# Patient Record
Sex: Female | Born: 1960 | Race: White | Hispanic: No | Marital: Single | Smoking: Never smoker
Health system: Southern US, Community
[De-identification: ages and names within clinical notes are randomized; demographics above are authoritative.]

## PROBLEM LIST (undated history)

## (undated) DIAGNOSIS — I10 Essential (primary) hypertension: Secondary | ICD-10-CM

## (undated) DIAGNOSIS — C140 Malignant neoplasm of pharynx, unspecified: Secondary | ICD-10-CM

## (undated) DIAGNOSIS — J45909 Unspecified asthma, uncomplicated: Secondary | ICD-10-CM

## (undated) DIAGNOSIS — M722 Plantar fascial fibromatosis: Secondary | ICD-10-CM

## (undated) DIAGNOSIS — E8881 Metabolic syndrome: Secondary | ICD-10-CM

## (undated) DIAGNOSIS — C569 Malignant neoplasm of unspecified ovary: Secondary | ICD-10-CM

## (undated) DIAGNOSIS — R32 Unspecified urinary incontinence: Secondary | ICD-10-CM

## (undated) DIAGNOSIS — K219 Gastro-esophageal reflux disease without esophagitis: Secondary | ICD-10-CM

## (undated) DIAGNOSIS — R110 Nausea: Secondary | ICD-10-CM

## (undated) DIAGNOSIS — E669 Obesity, unspecified: Secondary | ICD-10-CM

## (undated) DIAGNOSIS — F39 Unspecified mood [affective] disorder: Secondary | ICD-10-CM

## (undated) DIAGNOSIS — I5032 Chronic diastolic (congestive) heart failure: Secondary | ICD-10-CM

## (undated) DIAGNOSIS — C801 Malignant (primary) neoplasm, unspecified: Secondary | ICD-10-CM

## (undated) DIAGNOSIS — N3010 Interstitial cystitis (chronic) without hematuria: Secondary | ICD-10-CM

## (undated) DIAGNOSIS — H409 Unspecified glaucoma: Secondary | ICD-10-CM

## (undated) DIAGNOSIS — F5104 Psychophysiologic insomnia: Secondary | ICD-10-CM

## (undated) HISTORY — DX: Metabolic syndrome: E88.810

## (undated) HISTORY — DX: Metabolic syndrome: E88.81

## (undated) HISTORY — PX: KNEE SURGERY: SHX244

## (undated) HISTORY — DX: Unspecified glaucoma: H40.9

## (undated) HISTORY — DX: Unspecified mood (affective) disorder: F39

## (undated) HISTORY — DX: Unspecified urinary incontinence: R32

## (undated) HISTORY — DX: Obesity, unspecified: E66.9

## (undated) HISTORY — DX: Unspecified asthma, uncomplicated: J45.909

## (undated) HISTORY — DX: Nausea: R11.0

## (undated) HISTORY — PX: EYE SURGERY: SHX253

## (undated) HISTORY — DX: Chronic diastolic (congestive) heart failure: I50.32

## (undated) HISTORY — DX: Malignant (primary) neoplasm, unspecified: C80.1

## (undated) HISTORY — DX: Gastro-esophageal reflux disease without esophagitis: K21.9

## (undated) HISTORY — DX: Plantar fascial fibromatosis: M72.2

## (undated) HISTORY — DX: Psychophysiologic insomnia: F51.04

## (undated) HISTORY — DX: Essential (primary) hypertension: I10

## (undated) HISTORY — DX: Interstitial cystitis (chronic) without hematuria: N30.10

## (undated) HISTORY — DX: Malignant neoplasm of pharynx, unspecified: C14.0

## (undated) HISTORY — PX: BLADDER SURGERY: SHX569

## (undated) HISTORY — DX: Malignant neoplasm of unspecified ovary: C56.9

---

## 1987-03-17 HISTORY — PX: TUBAL LIGATION: SHX77

## 1990-03-16 DIAGNOSIS — C801 Malignant (primary) neoplasm, unspecified: Secondary | ICD-10-CM

## 1990-03-16 HISTORY — PX: ABDOMINAL HYSTERECTOMY: SHX81

## 1990-03-16 HISTORY — DX: Malignant (primary) neoplasm, unspecified: C80.1

## 1990-11-07 DIAGNOSIS — Z90710 Acquired absence of both cervix and uterus: Secondary | ICD-10-CM | POA: Insufficient documentation

## 1992-03-16 DIAGNOSIS — C569 Malignant neoplasm of unspecified ovary: Secondary | ICD-10-CM

## 1992-03-16 HISTORY — PX: TONSILLECTOMY AND ADENOIDECTOMY: SUR1326

## 1992-03-16 HISTORY — DX: Malignant neoplasm of unspecified ovary: C56.9

## 1994-03-16 HISTORY — PX: FOOT SURGERY: SHX648

## 1996-03-16 DIAGNOSIS — C140 Malignant neoplasm of pharynx, unspecified: Secondary | ICD-10-CM

## 1996-03-16 HISTORY — DX: Malignant neoplasm of pharynx, unspecified: C14.0

## 2003-05-15 LAB — HM COLONOSCOPY: HM Colonoscopy: NORMAL

## 2004-01-09 ENCOUNTER — Ambulatory Visit: Payer: Self-pay | Admitting: Pain Medicine

## 2004-01-28 ENCOUNTER — Ambulatory Visit: Payer: Self-pay | Admitting: Pain Medicine

## 2004-02-08 ENCOUNTER — Ambulatory Visit: Payer: Self-pay

## 2004-02-27 ENCOUNTER — Emergency Department: Payer: Self-pay | Admitting: General Practice

## 2004-02-28 ENCOUNTER — Ambulatory Visit: Payer: Self-pay | Admitting: Pain Medicine

## 2004-03-05 ENCOUNTER — Ambulatory Visit: Payer: Self-pay | Admitting: Pain Medicine

## 2004-04-10 ENCOUNTER — Ambulatory Visit: Payer: Self-pay | Admitting: Pain Medicine

## 2004-04-21 ENCOUNTER — Ambulatory Visit: Payer: Self-pay | Admitting: Pain Medicine

## 2004-05-27 ENCOUNTER — Ambulatory Visit: Payer: Self-pay | Admitting: Pain Medicine

## 2004-06-04 ENCOUNTER — Ambulatory Visit: Payer: Self-pay | Admitting: Pain Medicine

## 2004-07-01 ENCOUNTER — Ambulatory Visit: Payer: Self-pay | Admitting: Pain Medicine

## 2004-07-07 ENCOUNTER — Ambulatory Visit: Payer: Self-pay | Admitting: Pain Medicine

## 2004-07-21 ENCOUNTER — Emergency Department: Payer: Self-pay | Admitting: Emergency Medicine

## 2004-08-19 ENCOUNTER — Ambulatory Visit: Payer: Self-pay | Admitting: Pain Medicine

## 2004-08-25 ENCOUNTER — Ambulatory Visit: Payer: Self-pay | Admitting: Pain Medicine

## 2004-09-29 ENCOUNTER — Ambulatory Visit: Payer: Self-pay | Admitting: Pain Medicine

## 2004-10-30 ENCOUNTER — Ambulatory Visit: Payer: Self-pay | Admitting: Pain Medicine

## 2004-10-31 ENCOUNTER — Ambulatory Visit: Payer: Self-pay | Admitting: Pain Medicine

## 2004-11-03 ENCOUNTER — Ambulatory Visit: Payer: Self-pay | Admitting: Pain Medicine

## 2005-01-08 ENCOUNTER — Ambulatory Visit: Payer: Self-pay | Admitting: Pain Medicine

## 2005-01-14 ENCOUNTER — Ambulatory Visit: Payer: Self-pay | Admitting: Pain Medicine

## 2005-02-03 ENCOUNTER — Ambulatory Visit: Payer: Self-pay | Admitting: Pain Medicine

## 2005-02-09 ENCOUNTER — Ambulatory Visit: Payer: Self-pay | Admitting: Pain Medicine

## 2005-03-03 ENCOUNTER — Ambulatory Visit: Payer: Self-pay | Admitting: Pain Medicine

## 2005-03-11 ENCOUNTER — Ambulatory Visit: Payer: Self-pay | Admitting: Pain Medicine

## 2005-03-31 ENCOUNTER — Ambulatory Visit: Payer: Self-pay | Admitting: Pain Medicine

## 2005-04-06 ENCOUNTER — Ambulatory Visit: Payer: Self-pay | Admitting: Pain Medicine

## 2005-04-28 ENCOUNTER — Ambulatory Visit: Payer: Self-pay | Admitting: Pain Medicine

## 2005-05-01 ENCOUNTER — Encounter: Admission: RE | Admit: 2005-05-01 | Discharge: 2005-05-01 | Payer: Self-pay | Admitting: Neurosurgery

## 2005-05-04 ENCOUNTER — Ambulatory Visit: Payer: Self-pay | Admitting: Pain Medicine

## 2005-05-08 ENCOUNTER — Inpatient Hospital Stay: Payer: Self-pay | Admitting: Internal Medicine

## 2005-05-21 ENCOUNTER — Ambulatory Visit: Payer: Self-pay | Admitting: Pain Medicine

## 2005-05-27 ENCOUNTER — Ambulatory Visit: Payer: Self-pay | Admitting: Pain Medicine

## 2005-06-17 ENCOUNTER — Ambulatory Visit: Payer: Self-pay

## 2005-06-18 ENCOUNTER — Ambulatory Visit: Payer: Self-pay | Admitting: Pain Medicine

## 2005-06-29 ENCOUNTER — Ambulatory Visit: Payer: Self-pay | Admitting: Unknown Physician Specialty

## 2005-07-08 ENCOUNTER — Ambulatory Visit: Payer: Self-pay | Admitting: Pain Medicine

## 2005-07-14 ENCOUNTER — Ambulatory Visit: Payer: Self-pay | Admitting: Pain Medicine

## 2005-07-20 ENCOUNTER — Ambulatory Visit: Payer: Self-pay | Admitting: Pain Medicine

## 2005-08-11 ENCOUNTER — Ambulatory Visit: Payer: Self-pay | Admitting: Pain Medicine

## 2005-08-17 ENCOUNTER — Ambulatory Visit: Payer: Self-pay | Admitting: Pain Medicine

## 2005-09-08 ENCOUNTER — Ambulatory Visit: Payer: Self-pay | Admitting: Pain Medicine

## 2005-09-29 ENCOUNTER — Other Ambulatory Visit: Payer: Self-pay

## 2005-09-29 ENCOUNTER — Ambulatory Visit: Payer: Self-pay | Admitting: Urology

## 2005-09-29 ENCOUNTER — Ambulatory Visit: Payer: Self-pay | Admitting: Pain Medicine

## 2005-10-06 ENCOUNTER — Ambulatory Visit: Payer: Self-pay | Admitting: Urology

## 2005-10-29 ENCOUNTER — Ambulatory Visit: Payer: Self-pay | Admitting: Pain Medicine

## 2005-11-02 ENCOUNTER — Ambulatory Visit: Payer: Self-pay | Admitting: Pain Medicine

## 2005-11-21 ENCOUNTER — Emergency Department: Payer: Self-pay | Admitting: Emergency Medicine

## 2005-11-26 ENCOUNTER — Ambulatory Visit: Payer: Self-pay | Admitting: Pain Medicine

## 2005-12-02 ENCOUNTER — Ambulatory Visit: Payer: Self-pay | Admitting: Pain Medicine

## 2005-12-29 ENCOUNTER — Ambulatory Visit: Payer: Self-pay | Admitting: Pain Medicine

## 2006-01-18 ENCOUNTER — Ambulatory Visit: Payer: Self-pay | Admitting: Pain Medicine

## 2006-01-27 ENCOUNTER — Ambulatory Visit: Payer: Self-pay | Admitting: Gastroenterology

## 2006-02-23 ENCOUNTER — Ambulatory Visit: Payer: Self-pay | Admitting: Pain Medicine

## 2006-03-03 ENCOUNTER — Ambulatory Visit: Payer: Self-pay | Admitting: Pain Medicine

## 2006-03-25 ENCOUNTER — Ambulatory Visit: Payer: Self-pay | Admitting: Pain Medicine

## 2006-03-29 ENCOUNTER — Ambulatory Visit: Payer: Self-pay | Admitting: Pain Medicine

## 2006-04-24 ENCOUNTER — Ambulatory Visit: Payer: Self-pay | Admitting: Specialist

## 2006-04-27 ENCOUNTER — Ambulatory Visit: Payer: Self-pay | Admitting: Pain Medicine

## 2006-05-03 ENCOUNTER — Ambulatory Visit: Payer: Self-pay | Admitting: Pain Medicine

## 2006-05-25 ENCOUNTER — Ambulatory Visit: Payer: Self-pay | Admitting: Pain Medicine

## 2006-06-02 ENCOUNTER — Ambulatory Visit: Payer: Self-pay | Admitting: Pain Medicine

## 2006-06-24 ENCOUNTER — Ambulatory Visit: Payer: Self-pay | Admitting: Pain Medicine

## 2006-06-28 ENCOUNTER — Ambulatory Visit: Payer: Self-pay | Admitting: Unknown Physician Specialty

## 2006-06-30 ENCOUNTER — Ambulatory Visit: Payer: Self-pay | Admitting: Pain Medicine

## 2006-07-22 ENCOUNTER — Ambulatory Visit: Payer: Self-pay | Admitting: Pain Medicine

## 2006-07-28 ENCOUNTER — Ambulatory Visit: Payer: Self-pay | Admitting: Psychiatry

## 2006-08-19 ENCOUNTER — Ambulatory Visit: Payer: Self-pay | Admitting: Pain Medicine

## 2006-09-06 ENCOUNTER — Ambulatory Visit: Payer: Self-pay | Admitting: Pain Medicine

## 2006-09-23 ENCOUNTER — Ambulatory Visit: Payer: Self-pay | Admitting: Family Medicine

## 2006-10-14 ENCOUNTER — Ambulatory Visit: Payer: Self-pay | Admitting: Pain Medicine

## 2006-10-20 ENCOUNTER — Ambulatory Visit: Payer: Self-pay | Admitting: Pain Medicine

## 2006-11-18 ENCOUNTER — Ambulatory Visit: Payer: Self-pay | Admitting: Pain Medicine

## 2006-12-13 ENCOUNTER — Ambulatory Visit: Payer: Self-pay | Admitting: Pain Medicine

## 2007-01-18 ENCOUNTER — Ambulatory Visit: Payer: Self-pay | Admitting: Pain Medicine

## 2007-01-26 ENCOUNTER — Ambulatory Visit: Payer: Self-pay | Admitting: Pain Medicine

## 2007-02-17 ENCOUNTER — Ambulatory Visit: Payer: Self-pay | Admitting: Pain Medicine

## 2007-02-28 ENCOUNTER — Ambulatory Visit: Payer: Self-pay | Admitting: Pain Medicine

## 2007-04-19 ENCOUNTER — Ambulatory Visit: Payer: Self-pay | Admitting: Pain Medicine

## 2007-05-02 ENCOUNTER — Ambulatory Visit: Payer: Self-pay | Admitting: Pain Medicine

## 2007-05-19 ENCOUNTER — Ambulatory Visit: Payer: Self-pay | Admitting: Pain Medicine

## 2007-05-23 ENCOUNTER — Ambulatory Visit: Payer: Self-pay | Admitting: Pain Medicine

## 2007-05-31 ENCOUNTER — Ambulatory Visit: Payer: Self-pay | Admitting: Pain Medicine

## 2007-06-21 ENCOUNTER — Ambulatory Visit: Payer: Self-pay | Admitting: Pain Medicine

## 2007-06-27 ENCOUNTER — Ambulatory Visit: Payer: Self-pay | Admitting: Pain Medicine

## 2007-08-02 IMAGING — MG UNKNOWN MG STUDY
1 series · 6 of 6 positions shown · non-contrast
Comparison: none

REASON FOR EXAM: Fibrocystic changes
COMMENTS:

PROCEDURE:     MAM - MAM DGTL DIAGNOSTIC MAMMO W/CAD  - June 17, 2005  [DATE]
RESULT:     No dominant masses or pathologic clustered calcifications are
demonstrated. The exam is stable from prior exams. CAD evaluation is
non-focal. Scarring is noted over the RIGHT breast.

[Series 1921: R CC · right · 6 of 6 slices shown]
[im 1/6]
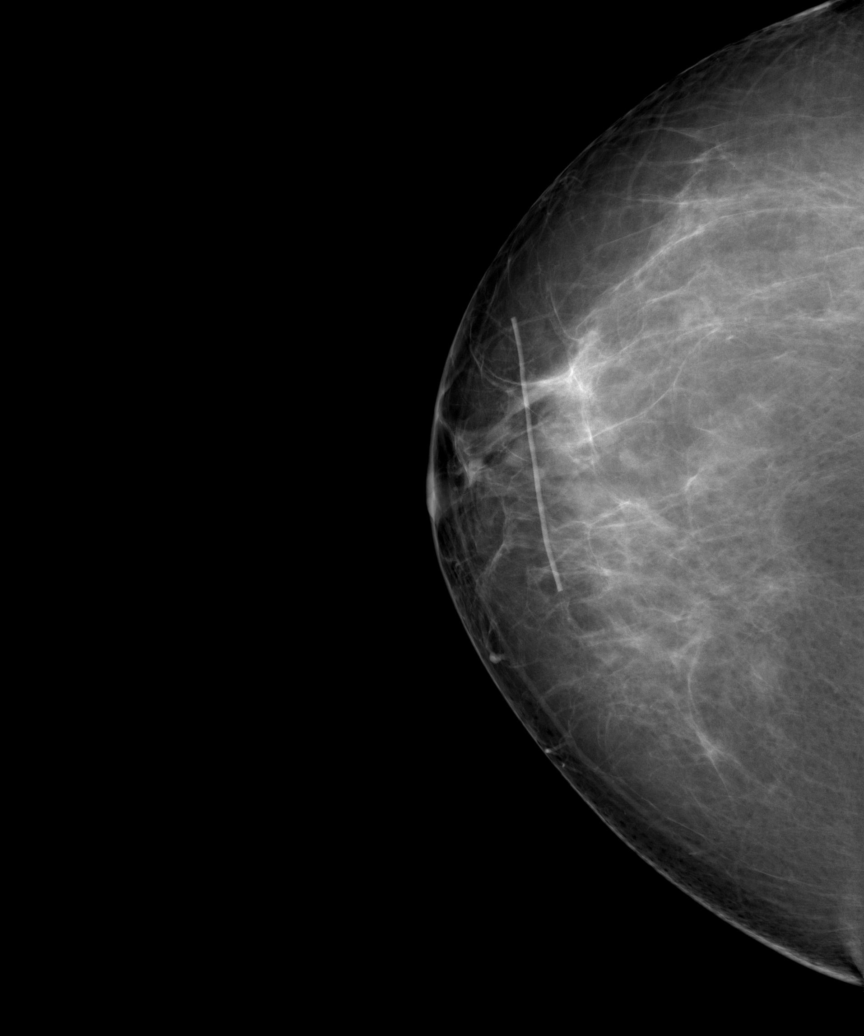
[im 2/6]
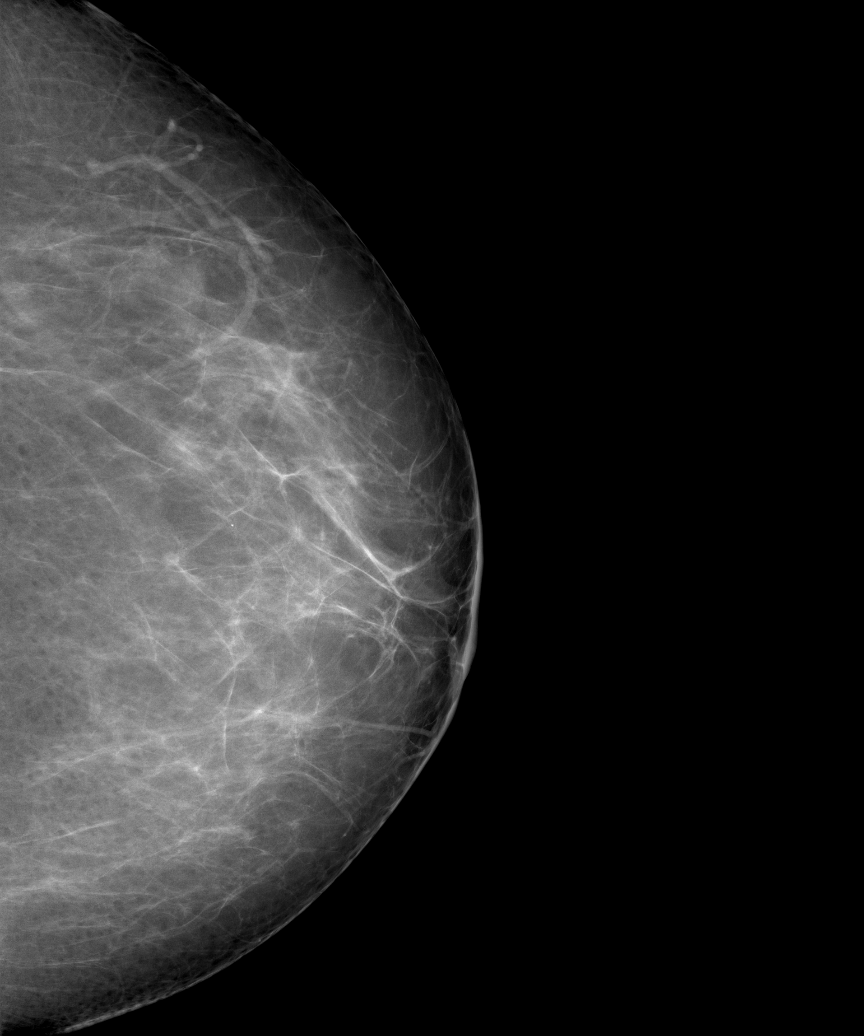
[im 3/6]
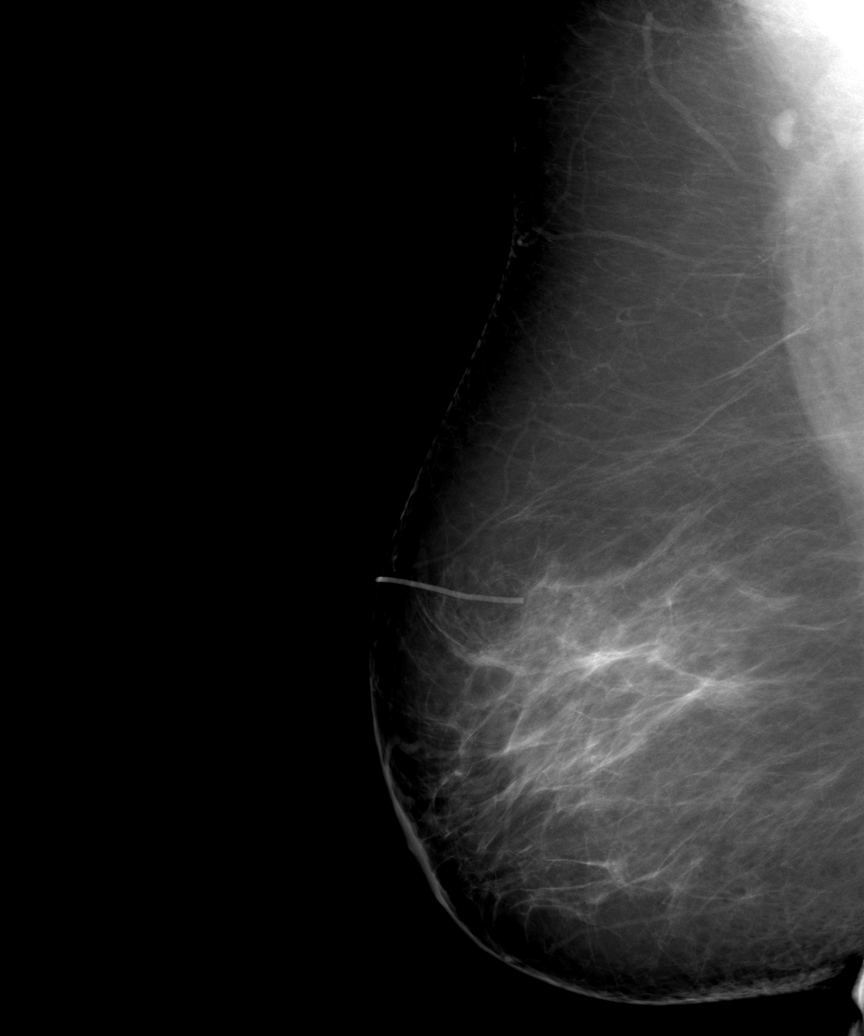
[im 4/6]
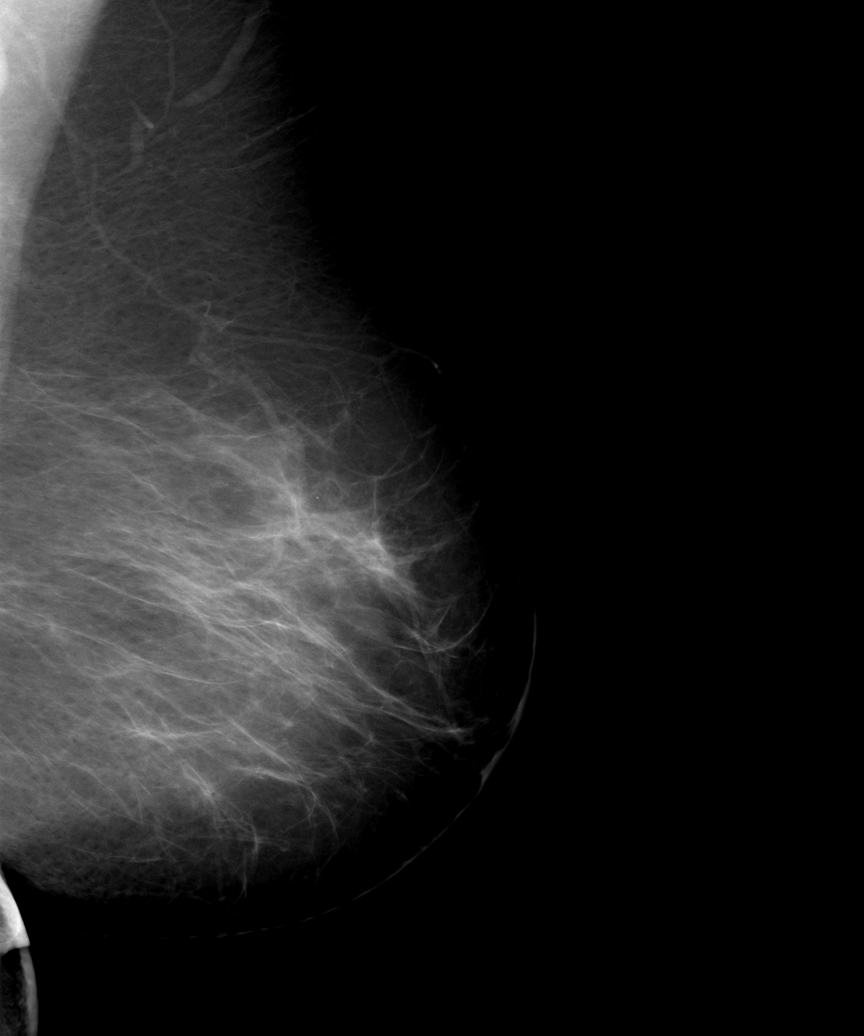
[im 5/6]
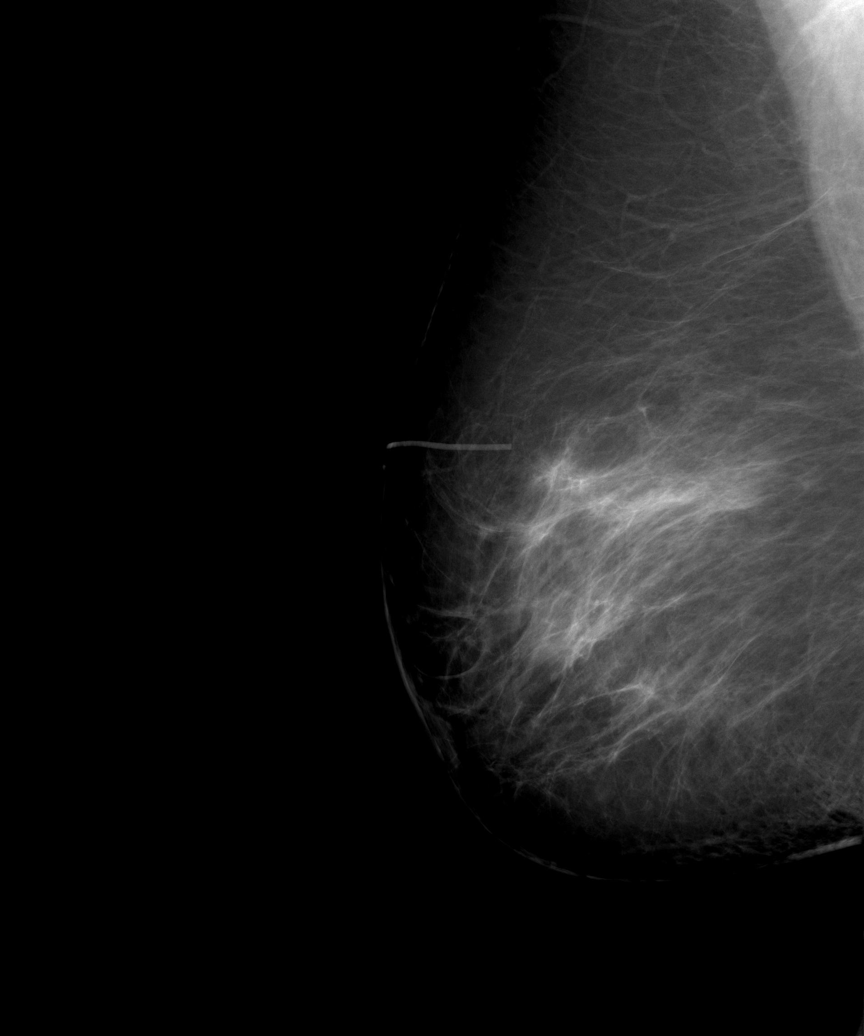
[im 6/6]
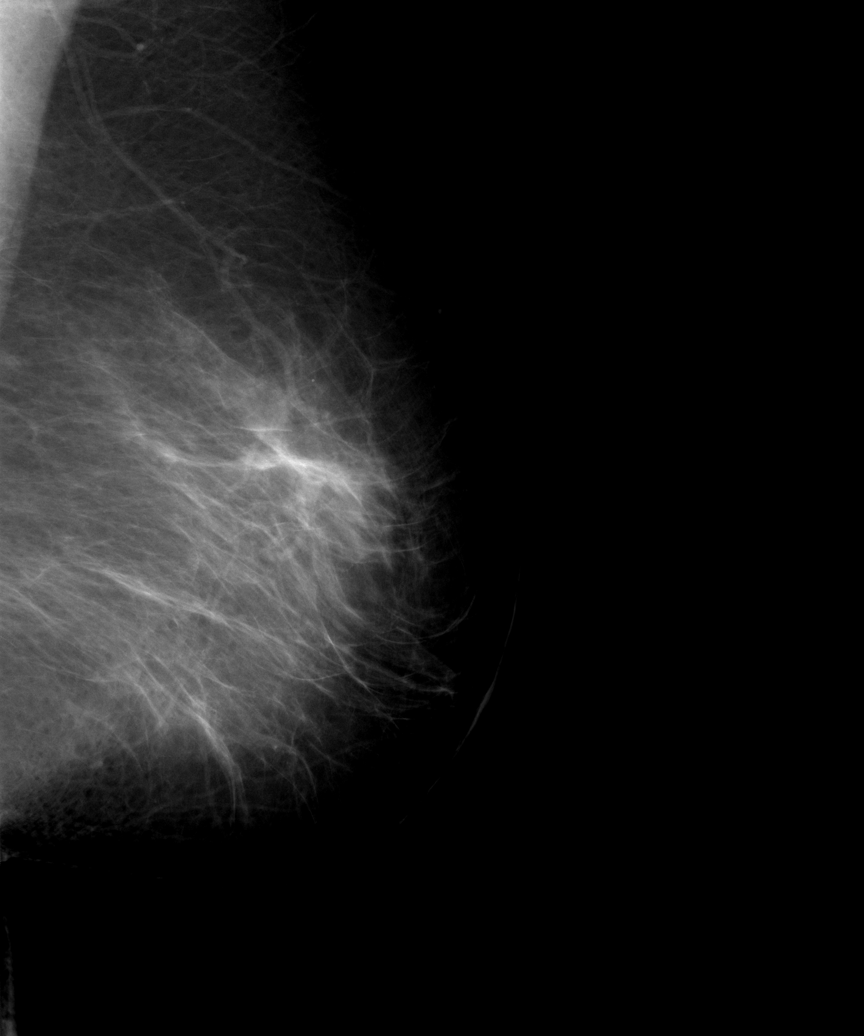

[6 of 6 positions shown; findings below may reference images not displayed]

IMPRESSION: Stable, benign exam. Yearly follow-up mammograms are
suggested.

BI-RADS:  Category 2 - Benign Imaging Findings

Thank you for this opportunity to contribute to the care of your patient.

A NEGATIVE MAMMOGRAM REPORT DOES NOT PRECLUDE BIOPSY OR OTHER EVALUATION OF
CLINICALLY PALPABLE OR OTHERWISE SUSPICIOUS MASS OR LESION.  BREAST CANCER
MAY NOT BE DETECTED BY MAMMOGRAPHY IN UP TO 10% OF CASES.

## 2007-08-11 ENCOUNTER — Ambulatory Visit: Payer: Self-pay | Admitting: Pain Medicine

## 2007-08-16 ENCOUNTER — Ambulatory Visit: Payer: Self-pay | Admitting: Gastroenterology

## 2007-08-29 ENCOUNTER — Ambulatory Visit: Payer: Self-pay | Admitting: Pain Medicine

## 2007-09-13 ENCOUNTER — Ambulatory Visit: Payer: Self-pay | Admitting: Pain Medicine

## 2007-09-21 ENCOUNTER — Ambulatory Visit: Payer: Self-pay | Admitting: Pain Medicine

## 2007-10-18 ENCOUNTER — Ambulatory Visit: Payer: Self-pay | Admitting: Pain Medicine

## 2007-10-26 ENCOUNTER — Ambulatory Visit: Payer: Self-pay | Admitting: Pain Medicine

## 2007-11-17 ENCOUNTER — Ambulatory Visit: Payer: Self-pay | Admitting: Pain Medicine

## 2007-12-12 ENCOUNTER — Ambulatory Visit: Payer: Self-pay | Admitting: Pain Medicine

## 2008-01-17 ENCOUNTER — Ambulatory Visit: Payer: Self-pay | Admitting: Pain Medicine

## 2008-01-30 ENCOUNTER — Ambulatory Visit: Payer: Self-pay | Admitting: Pain Medicine

## 2008-02-16 ENCOUNTER — Ambulatory Visit: Payer: Self-pay | Admitting: Pain Medicine

## 2008-03-05 ENCOUNTER — Ambulatory Visit: Payer: Self-pay | Admitting: Pain Medicine

## 2008-03-16 LAB — HM DEXA SCAN

## 2008-03-19 ENCOUNTER — Ambulatory Visit: Payer: Self-pay | Admitting: Pain Medicine

## 2008-04-10 ENCOUNTER — Ambulatory Visit: Payer: Self-pay | Admitting: Pain Medicine

## 2008-05-09 ENCOUNTER — Ambulatory Visit: Payer: Self-pay | Admitting: Pain Medicine

## 2008-06-12 ENCOUNTER — Ambulatory Visit: Payer: Self-pay | Admitting: Pain Medicine

## 2008-07-04 ENCOUNTER — Ambulatory Visit: Payer: Self-pay | Admitting: Pain Medicine

## 2008-07-24 ENCOUNTER — Ambulatory Visit: Payer: Self-pay | Admitting: Pain Medicine

## 2008-07-30 ENCOUNTER — Ambulatory Visit: Payer: Self-pay | Admitting: Pain Medicine

## 2008-09-11 ENCOUNTER — Ambulatory Visit: Payer: Self-pay | Admitting: Pain Medicine

## 2008-10-16 ENCOUNTER — Ambulatory Visit: Payer: Self-pay | Admitting: Pain Medicine

## 2008-10-31 ENCOUNTER — Ambulatory Visit: Payer: Self-pay | Admitting: Pain Medicine

## 2008-11-13 ENCOUNTER — Ambulatory Visit: Payer: Self-pay | Admitting: Pain Medicine

## 2008-12-13 ENCOUNTER — Ambulatory Visit: Payer: Self-pay | Admitting: Pain Medicine

## 2009-01-07 ENCOUNTER — Ambulatory Visit: Payer: Self-pay | Admitting: Pain Medicine

## 2009-01-30 ENCOUNTER — Emergency Department: Payer: Self-pay | Admitting: Emergency Medicine

## 2009-02-12 ENCOUNTER — Ambulatory Visit: Payer: Self-pay | Admitting: Pain Medicine

## 2009-02-20 ENCOUNTER — Ambulatory Visit: Payer: Self-pay | Admitting: Pain Medicine

## 2009-03-14 ENCOUNTER — Ambulatory Visit: Payer: Self-pay | Admitting: Pain Medicine

## 2009-04-11 ENCOUNTER — Ambulatory Visit: Payer: Self-pay | Admitting: Pain Medicine

## 2009-04-15 ENCOUNTER — Ambulatory Visit: Payer: Self-pay | Admitting: Pain Medicine

## 2009-05-07 ENCOUNTER — Ambulatory Visit: Payer: Self-pay | Admitting: Pain Medicine

## 2009-06-06 ENCOUNTER — Ambulatory Visit: Payer: Self-pay | Admitting: Pain Medicine

## 2009-06-17 ENCOUNTER — Ambulatory Visit: Payer: Self-pay | Admitting: Pain Medicine

## 2009-07-04 ENCOUNTER — Ambulatory Visit: Payer: Self-pay | Admitting: Pain Medicine

## 2009-08-01 ENCOUNTER — Ambulatory Visit: Payer: Self-pay | Admitting: Pain Medicine

## 2009-08-14 ENCOUNTER — Ambulatory Visit: Payer: Self-pay | Admitting: Pain Medicine

## 2009-08-29 ENCOUNTER — Ambulatory Visit: Payer: Self-pay | Admitting: Pain Medicine

## 2009-09-24 ENCOUNTER — Ambulatory Visit: Payer: Self-pay | Admitting: Pain Medicine

## 2009-10-11 ENCOUNTER — Emergency Department: Payer: Self-pay | Admitting: Unknown Physician Specialty

## 2009-10-24 ENCOUNTER — Ambulatory Visit: Payer: Self-pay | Admitting: Pain Medicine

## 2009-11-21 ENCOUNTER — Ambulatory Visit: Payer: Self-pay | Admitting: Pain Medicine

## 2009-12-17 ENCOUNTER — Ambulatory Visit: Payer: Self-pay | Admitting: Pain Medicine

## 2010-01-01 ENCOUNTER — Encounter: Payer: Self-pay | Admitting: Family Medicine

## 2010-01-14 ENCOUNTER — Ambulatory Visit: Payer: Self-pay | Admitting: Pain Medicine

## 2010-01-22 ENCOUNTER — Ambulatory Visit: Payer: Self-pay | Admitting: Pain Medicine

## 2010-02-04 ENCOUNTER — Encounter: Payer: Self-pay | Admitting: Family Medicine

## 2010-02-11 ENCOUNTER — Ambulatory Visit: Payer: Self-pay | Admitting: Pain Medicine

## 2010-02-13 ENCOUNTER — Encounter: Payer: Self-pay | Admitting: Family Medicine

## 2010-03-18 ENCOUNTER — Ambulatory Visit: Payer: Self-pay | Admitting: Pain Medicine

## 2010-03-19 ENCOUNTER — Encounter: Payer: Self-pay | Admitting: Family Medicine

## 2010-03-25 ENCOUNTER — Ambulatory Visit: Payer: Self-pay | Admitting: Family Medicine

## 2010-04-17 ENCOUNTER — Ambulatory Visit: Payer: Self-pay | Admitting: Pain Medicine

## 2010-04-23 ENCOUNTER — Ambulatory Visit: Payer: Self-pay | Admitting: Pain Medicine

## 2010-05-15 ENCOUNTER — Ambulatory Visit: Payer: Self-pay | Admitting: Pain Medicine

## 2010-05-26 ENCOUNTER — Ambulatory Visit: Payer: Self-pay | Admitting: Pain Medicine

## 2010-06-10 ENCOUNTER — Ambulatory Visit: Payer: Self-pay | Admitting: Pain Medicine

## 2010-07-15 ENCOUNTER — Ambulatory Visit: Payer: Self-pay | Admitting: Pain Medicine

## 2010-07-30 ENCOUNTER — Ambulatory Visit: Payer: Self-pay | Admitting: Pain Medicine

## 2010-08-14 ENCOUNTER — Ambulatory Visit: Payer: Self-pay | Admitting: Pain Medicine

## 2010-09-11 ENCOUNTER — Ambulatory Visit: Payer: Self-pay | Admitting: Pain Medicine

## 2010-09-24 ENCOUNTER — Ambulatory Visit: Payer: Self-pay | Admitting: Bariatrics

## 2010-09-25 ENCOUNTER — Ambulatory Visit: Payer: Self-pay | Admitting: Specialist

## 2010-10-09 ENCOUNTER — Ambulatory Visit: Payer: Self-pay | Admitting: Pain Medicine

## 2010-10-17 ENCOUNTER — Ambulatory Visit: Payer: Self-pay | Admitting: Specialist

## 2010-10-30 ENCOUNTER — Ambulatory Visit: Payer: Self-pay | Admitting: Bariatrics

## 2010-11-06 ENCOUNTER — Ambulatory Visit: Payer: Self-pay | Admitting: Pain Medicine

## 2010-11-15 ENCOUNTER — Ambulatory Visit: Payer: Self-pay | Admitting: Bariatrics

## 2010-12-09 ENCOUNTER — Ambulatory Visit: Payer: Self-pay | Admitting: Pain Medicine

## 2010-12-09 DIAGNOSIS — N3941 Urge incontinence: Secondary | ICD-10-CM | POA: Insufficient documentation

## 2010-12-09 DIAGNOSIS — R351 Nocturia: Secondary | ICD-10-CM | POA: Insufficient documentation

## 2010-12-09 DIAGNOSIS — R350 Frequency of micturition: Secondary | ICD-10-CM | POA: Insufficient documentation

## 2010-12-11 ENCOUNTER — Ambulatory Visit: Payer: Self-pay | Admitting: Pain Medicine

## 2010-12-15 ENCOUNTER — Ambulatory Visit: Payer: Self-pay | Admitting: Pain Medicine

## 2010-12-16 ENCOUNTER — Ambulatory Visit: Payer: Self-pay | Admitting: Pain Medicine

## 2011-01-06 ENCOUNTER — Ambulatory Visit: Payer: Self-pay | Admitting: Pain Medicine

## 2011-01-12 ENCOUNTER — Ambulatory Visit: Payer: Self-pay | Admitting: Pain Medicine

## 2011-01-29 ENCOUNTER — Ambulatory Visit: Payer: Self-pay | Admitting: Pain Medicine

## 2011-02-09 ENCOUNTER — Ambulatory Visit: Payer: Self-pay | Admitting: Pain Medicine

## 2011-02-26 ENCOUNTER — Ambulatory Visit: Payer: Self-pay | Admitting: Pain Medicine

## 2011-03-31 ENCOUNTER — Ambulatory Visit: Payer: Self-pay | Admitting: Pain Medicine

## 2011-04-30 ENCOUNTER — Ambulatory Visit: Payer: Self-pay | Admitting: Pain Medicine

## 2011-05-28 ENCOUNTER — Ambulatory Visit: Payer: Self-pay | Admitting: Pain Medicine

## 2011-06-01 ENCOUNTER — Ambulatory Visit: Payer: Self-pay | Admitting: Pain Medicine

## 2011-06-23 ENCOUNTER — Ambulatory Visit: Payer: Self-pay | Admitting: Pain Medicine

## 2011-07-28 ENCOUNTER — Ambulatory Visit: Payer: Self-pay | Admitting: Pain Medicine

## 2011-08-03 ENCOUNTER — Ambulatory Visit: Payer: Self-pay | Admitting: Pain Medicine

## 2011-08-27 ENCOUNTER — Ambulatory Visit: Payer: Self-pay | Admitting: Pain Medicine

## 2011-09-29 ENCOUNTER — Ambulatory Visit: Payer: Self-pay | Admitting: Pain Medicine

## 2011-10-12 ENCOUNTER — Ambulatory Visit: Payer: Self-pay | Admitting: Pain Medicine

## 2011-10-29 ENCOUNTER — Ambulatory Visit: Payer: Self-pay | Admitting: Pain Medicine

## 2011-11-26 ENCOUNTER — Ambulatory Visit: Payer: Self-pay | Admitting: Pain Medicine

## 2011-12-24 ENCOUNTER — Ambulatory Visit: Payer: Self-pay | Admitting: Pain Medicine

## 2011-12-24 ENCOUNTER — Encounter: Payer: Self-pay | Admitting: Nurse Practitioner

## 2011-12-25 ENCOUNTER — Encounter: Payer: Self-pay | Admitting: Cardiothoracic Surgery

## 2012-01-15 ENCOUNTER — Encounter: Payer: Self-pay | Admitting: Cardiothoracic Surgery

## 2012-01-15 ENCOUNTER — Encounter: Payer: Self-pay | Admitting: Nurse Practitioner

## 2012-01-19 ENCOUNTER — Ambulatory Visit: Payer: Self-pay | Admitting: Pain Medicine

## 2012-01-25 ENCOUNTER — Ambulatory Visit: Payer: Self-pay | Admitting: Pain Medicine

## 2012-02-08 ENCOUNTER — Ambulatory Visit: Payer: Self-pay | Admitting: Pain Medicine

## 2012-02-14 ENCOUNTER — Encounter: Payer: Self-pay | Admitting: Nurse Practitioner

## 2012-02-17 ENCOUNTER — Ambulatory Visit: Payer: Self-pay | Admitting: Pain Medicine

## 2012-02-18 ENCOUNTER — Encounter: Payer: Self-pay | Admitting: Cardiothoracic Surgery

## 2012-03-24 ENCOUNTER — Ambulatory Visit: Payer: Self-pay | Admitting: Pain Medicine

## 2012-03-30 ENCOUNTER — Ambulatory Visit: Payer: Self-pay | Admitting: Pain Medicine

## 2012-04-05 ENCOUNTER — Emergency Department: Payer: Self-pay | Admitting: Emergency Medicine

## 2012-04-21 ENCOUNTER — Ambulatory Visit: Payer: Self-pay | Admitting: Pain Medicine

## 2012-05-06 ENCOUNTER — Ambulatory Visit: Payer: Self-pay | Admitting: Pain Medicine

## 2012-05-17 ENCOUNTER — Ambulatory Visit: Payer: Self-pay | Admitting: Pain Medicine

## 2012-06-15 ENCOUNTER — Ambulatory Visit: Payer: Self-pay | Admitting: Pain Medicine

## 2012-07-06 ENCOUNTER — Ambulatory Visit: Payer: Self-pay | Admitting: Pain Medicine

## 2012-07-13 ENCOUNTER — Encounter: Payer: Self-pay | Admitting: Cardiothoracic Surgery

## 2012-07-13 ENCOUNTER — Encounter: Payer: Self-pay | Admitting: Nurse Practitioner

## 2012-07-14 ENCOUNTER — Encounter: Payer: Self-pay | Admitting: Nurse Practitioner

## 2012-07-19 ENCOUNTER — Ambulatory Visit: Payer: Self-pay | Admitting: Pain Medicine

## 2012-08-01 ENCOUNTER — Encounter: Payer: Self-pay | Admitting: Cardiothoracic Surgery

## 2012-08-14 ENCOUNTER — Encounter: Payer: Self-pay | Admitting: Cardiothoracic Surgery

## 2012-08-14 ENCOUNTER — Encounter: Payer: Self-pay | Admitting: Nurse Practitioner

## 2012-08-17 ENCOUNTER — Ambulatory Visit: Payer: Self-pay | Admitting: Pain Medicine

## 2012-09-15 ENCOUNTER — Ambulatory Visit: Payer: Self-pay | Admitting: Pain Medicine

## 2012-09-29 ENCOUNTER — Emergency Department: Payer: Self-pay | Admitting: Emergency Medicine

## 2012-10-04 ENCOUNTER — Encounter: Payer: Self-pay | Admitting: *Deleted

## 2012-10-12 ENCOUNTER — Ambulatory Visit: Payer: Self-pay | Admitting: Pain Medicine

## 2012-10-20 ENCOUNTER — Other Ambulatory Visit: Payer: Self-pay

## 2012-10-20 ENCOUNTER — Encounter: Payer: Self-pay | Admitting: General Surgery

## 2012-10-20 ENCOUNTER — Ambulatory Visit (INDEPENDENT_AMBULATORY_CARE_PROVIDER_SITE_OTHER): Payer: Medicare Other | Admitting: General Surgery

## 2012-10-20 ENCOUNTER — Other Ambulatory Visit: Payer: Self-pay | Admitting: *Deleted

## 2012-10-20 ENCOUNTER — Ambulatory Visit: Payer: Self-pay | Admitting: General Surgery

## 2012-10-20 ENCOUNTER — Inpatient Hospital Stay
Admission: RE | Admit: 2012-10-20 | Discharge: 2012-10-20 | Disposition: A | Discharge status: Home / Self Care | Payer: Self-pay | Source: Ambulatory Visit | Attending: General Surgery | Admitting: General Surgery

## 2012-10-20 VITALS — BP 138/98 | HR 76 | Resp 16 | Ht 67.0 in | Wt 254.0 lb

## 2012-10-20 DIAGNOSIS — R109 Unspecified abdominal pain: Secondary | ICD-10-CM | POA: Insufficient documentation

## 2012-10-20 DIAGNOSIS — N63 Unspecified lump in unspecified breast: Secondary | ICD-10-CM

## 2012-10-20 DIAGNOSIS — Z1231 Encounter for screening mammogram for malignant neoplasm of breast: Secondary | ICD-10-CM

## 2012-10-20 NOTE — Progress Notes (Signed)
The patient will be asked to have a bilateral screening mammogram at [facility]. This has been arranged for 11-04-12 at 11 am.

## 2012-10-20 NOTE — Patient Instructions (Addendum)
Continue self breast exams. Call office for any new breast issues or concerns.  The patient is aware to call back for any questions or concerns.  The patient will be asked to have a bilateral screening mammogram at [facility]. This has been arranged for 11-04-12 at 11 am.

## 2012-10-20 NOTE — Progress Notes (Signed)
Patient ID: Jacqueline Campbell, female   DOB: 1960-04-01, 52 y.o.   MRN: [id number]  No chief complaint on file.   HPI Jacqueline Campbell is a 52 y.o. female. Patient here today for evaluation of a left lower abdomen knot that haas been there for about 6 months.  States it is getting larger and more painful than in past. Denies any GI symptoms. She also states she has a knot in her right breast that has been there for about 9 months and she has not had a mammogram.   HPI  Past Medical History  Diagnosis Date  . Asthma   . Glaucoma   . GERD (gastroesophageal reflux disease)   . Cancer 1992    cervical  . Ovarian cancer 1994  . Throat cancer 1998    Past Surgical History  Procedure Laterality Date  . Tubal ligation  1989  . Abdominal hysterectomy  1992  . Tonsillectomy and adenoidectomy  1994  . Foot surgery Bilateral 1996  . Knee surgery Left     age 71  . Eye surgery Bilateral 2006, 2008, 2010  . Bladder surgery      multiple  . Breast biopsy Right     Family History  Problem Relation Age of Onset  . Breast cancer Mother     eye and ovarian  . Prostate cancer Brother     Social History History  Substance Use Topics  . Smoking status: Never Smoker   . Smokeless tobacco: Not on file  . Alcohol Use: No    Allergies  Allergen Reactions  . Red Dye Anaphylaxis  . Abilify (Aripiprazole)   . Amitiza (Lubiprostone)   . Benadryl (Diphenhydramine Hcl)   . Gabapentin   . Iohexol   . Risperidone And Related   . Aspirin Rash  . Ciprofloxacin Rash  . Enablex (Darifenacin) Rash  . Iodides Rash  . Latex Rash  . Levaquin (Levofloxacin In D5w) Rash  . Penicillins Rash  . Sulfa Antibiotics Rash    Current Outpatient Prescriptions  Medication Sig Dispense Refill  . albuterol (PROVENTIL) (2.5 MG/3ML) 0.083% nebulizer solution Take 2.5 mg by nebulization every 6 (six) hours as needed for wheezing.      Marland Kitchen amLODipine-olmesartan (AZOR) 10-40 MG per tablet Take 1 tablet by mouth  daily.      . cloNIDine (CATAPRES - DOSED IN MG/24 HR) 0.1 mg/24hr patch Place 1 patch onto the skin once a week.      . cloNIDine (CATAPRES) 0.2 MG tablet Take 0.2 mg by mouth 2 (two) times daily.      . diazepam (VALIUM) 5 MG tablet Take 5 mg by mouth every 6 (six) hours as needed for anxiety.      . dorzolamide (TRUSOPT) 2 % ophthalmic solution Place 1 drop into both eyes 3 (three) times daily.      Marland Kitchen esomeprazole (NEXIUM) 40 MG capsule Take 40 mg by mouth daily before breakfast.      . Fluticasone-Salmeterol (ADVAIR) 500-50 MCG/DOSE AEPB Inhale 1 puff into the lungs every 12 (twelve) hours.      . furosemide (LASIX) 20 MG tablet Take 20 mg by mouth daily.      Marland Kitchen levocetirizine (XYZAL) 5 MG tablet Take 5 mg by mouth every evening.      . mometasone (ELOCON) 0.1 % cream Apply topically daily.      . mometasone (NASONEX) 50 MCG/ACT nasal spray Place 2 sprays into the nose daily.      Marland Kitchen  montelukast (SINGULAIR) 10 MG tablet Take 10 mg by mouth at bedtime.      . nebivolol (BYSTOLIC) 10 MG tablet Take 10 mg by mouth daily.      . OxyCODONE (OXYCONTIN) 15 mg T12A 12 hr tablet Take 15 mg by mouth 3 (three) times daily.      . pentosan polysulfate (ELMIRON) 100 MG capsule Take 100 mg by mouth 3 (three) times daily before meals.      . Potassium Chloride Crys CR (KLOR-CON M20 PO) Take 1 tablet by mouth daily.      . promethazine (PHENERGAN) 25 MG tablet Take 25 mg by mouth every 6 (six) hours as needed for nausea.      . Sodium Fluoride (DENTA 5000 PLUS DT) Place onto teeth 3 (three) times daily.      Marland Kitchen tiZANidine (ZANAFLEX) 4 MG capsule Take 4 mg by mouth 3 (three) times daily.      Marland Kitchen zolpidem (AMBIEN) 10 MG tablet Take 10 mg by mouth at bedtime as needed for sleep.       No current facility-administered medications for this visit.    Review of Systems Review of Systems  Constitutional: Negative.   Respiratory: Negative.   Cardiovascular: Negative.     Blood pressure 138/98, pulse 76, resp.  rate 16, height 5\' 7"  (1.702 m), weight 254 lb (115.214 kg).  Physical Exam Physical Exam  Constitutional: She is oriented to person, place, and time. She appears well-developed and well-nourished.  Eyes: Conjunctivae are normal.  Neck: Neck supple. No thyromegaly present.  Cardiovascular: Normal rate and regular rhythm.   Pulmonary/Chest: Effort normal and breath sounds normal. Right breast exhibits mass (4-5 mm nodule along previous scar ). Right breast exhibits no inverted nipple, no nipple discharge, no skin change and no tenderness. Left breast exhibits no inverted nipple, no mass, no nipple discharge, no skin change and no tenderness.  Abdominal: Soft. Bowel sounds are normal. There is tenderness in the left lower quadrant. No hernia.    Lymphadenopathy:    She has no cervical adenopathy.    She has no axillary adenopathy.  Neurological: She is alert and oriented to person, place, and time.  Skin: Skin is warm and dry.    Data Reviewed US done today-llq of abd wall and right breast-both normal.  Assessment    Left abd wall finding may be a lipoma but is not clearly evident. Right breast nodule likely associated with prior scar.      Plan      F/U prn for any enlargement of the left abd wall and rt breast area.    The patient will be asked to have a bilateral screening mammogram at [facility]. This has been arranged for 11-04-12 at 11 am.    Kieth Brightly 10/20/2012, 6:52 PM

## 2012-11-15 ENCOUNTER — Ambulatory Visit: Payer: Self-pay | Admitting: Pain Medicine

## 2012-12-14 ENCOUNTER — Ambulatory Visit: Payer: Self-pay | Admitting: Pain Medicine

## 2012-12-23 LAB — HEMOGLOBIN A1C: HEMOGLOBIN A1C: 5.8 % (ref 4.0–6.0)

## 2012-12-23 LAB — HM PAP SMEAR: HM Pap smear: NORMAL

## 2012-12-26 ENCOUNTER — Ambulatory Visit: Payer: Self-pay | Admitting: Pain Medicine

## 2013-01-12 ENCOUNTER — Ambulatory Visit: Payer: Self-pay | Admitting: Pain Medicine

## 2013-02-07 ENCOUNTER — Ambulatory Visit: Payer: Self-pay | Admitting: Pain Medicine

## 2013-03-07 ENCOUNTER — Ambulatory Visit: Payer: Self-pay | Admitting: Pain Medicine

## 2013-04-06 ENCOUNTER — Ambulatory Visit: Payer: Self-pay | Admitting: Pain Medicine

## 2013-04-12 ENCOUNTER — Ambulatory Visit: Payer: Self-pay | Admitting: Pain Medicine

## 2013-05-09 ENCOUNTER — Ambulatory Visit: Payer: Self-pay | Admitting: Pain Medicine

## 2013-06-06 ENCOUNTER — Ambulatory Visit: Payer: Self-pay | Admitting: Pain Medicine

## 2013-07-04 ENCOUNTER — Ambulatory Visit: Payer: Self-pay | Admitting: Pain Medicine

## 2013-08-03 ENCOUNTER — Ambulatory Visit: Payer: Self-pay | Admitting: Pain Medicine

## 2013-08-28 ENCOUNTER — Ambulatory Visit: Payer: Self-pay | Admitting: Pain Medicine

## 2013-10-03 ENCOUNTER — Ambulatory Visit: Payer: Self-pay | Admitting: Pain Medicine

## 2013-10-12 ENCOUNTER — Inpatient Hospital Stay: Payer: Self-pay | Admitting: Internal Medicine

## 2013-10-12 DIAGNOSIS — R0602 Shortness of breath: Secondary | ICD-10-CM

## 2013-10-12 LAB — URINALYSIS, COMPLETE
BILIRUBIN, UR: NEGATIVE
BLOOD: NEGATIVE
Bacteria: NONE SEEN
GLUCOSE, UR: NEGATIVE mg/dL (ref 0–75)
KETONE: NEGATIVE
Leukocyte Esterase: NEGATIVE
Nitrite: NEGATIVE
PROTEIN: NEGATIVE
Ph: 5 (ref 4.5–8.0)
SPECIFIC GRAVITY: 1.011 (ref 1.003–1.030)
WBC UR: 1 /HPF (ref 0–5)

## 2013-10-12 LAB — BASIC METABOLIC PANEL
Anion Gap: 7 (ref 7–16)
BUN: 39 mg/dL — AB (ref 7–18)
CALCIUM: 8.6 mg/dL (ref 8.5–10.1)
CHLORIDE: 102 mmol/L (ref 98–107)
CO2: 27 mmol/L (ref 21–32)
CREATININE: 2.59 mg/dL — AB (ref 0.60–1.30)
GFR CALC AF AMER: 24 — AB
GFR CALC NON AF AMER: 21 — AB
Glucose: 89 mg/dL (ref 65–99)
OSMOLALITY: 281 (ref 275–301)
Potassium: 4.9 mmol/L (ref 3.5–5.1)
SODIUM: 136 mmol/L (ref 136–145)

## 2013-10-12 LAB — CBC
HCT: 31.7 % — ABNORMAL LOW (ref 35.0–47.0)
HGB: 10.5 g/dL — AB (ref 12.0–16.0)
MCH: 30.1 pg (ref 26.0–34.0)
MCHC: 33 g/dL (ref 32.0–36.0)
MCV: 91 fL (ref 80–100)
Platelet: 201 10*3/uL (ref 150–440)
RBC: 3.48 10*6/uL — AB (ref 3.80–5.20)
RDW: 12.8 % (ref 11.5–14.5)
WBC: 7.4 10*3/uL (ref 3.6–11.0)

## 2013-10-12 LAB — TROPONIN I

## 2013-10-12 LAB — TSH: THYROID STIMULATING HORM: 2.33 u[IU]/mL

## 2013-10-13 LAB — BASIC METABOLIC PANEL
Anion Gap: 6 — ABNORMAL LOW (ref 7–16)
BUN: 28 mg/dL — AB (ref 7–18)
Calcium, Total: 8.5 mg/dL (ref 8.5–10.1)
Chloride: 105 mmol/L (ref 98–107)
Co2: 28 mmol/L (ref 21–32)
Creatinine: 1.47 mg/dL — ABNORMAL HIGH (ref 0.60–1.30)
GFR CALC AF AMER: 47 — AB
GFR CALC NON AF AMER: 41 — AB
Glucose: 89 mg/dL (ref 65–99)
Osmolality: 282 (ref 275–301)
Potassium: 5.2 mmol/L — ABNORMAL HIGH (ref 3.5–5.1)
Sodium: 139 mmol/L (ref 136–145)

## 2013-11-01 ENCOUNTER — Ambulatory Visit: Payer: Self-pay | Admitting: Pain Medicine

## 2013-12-04 ENCOUNTER — Ambulatory Visit: Payer: Self-pay | Admitting: Pain Medicine

## 2014-01-02 ENCOUNTER — Ambulatory Visit: Payer: Self-pay | Admitting: Pain Medicine

## 2014-01-15 ENCOUNTER — Encounter: Payer: Self-pay | Admitting: General Surgery

## 2014-01-30 ENCOUNTER — Ambulatory Visit: Payer: Self-pay | Admitting: Pain Medicine

## 2014-02-19 ENCOUNTER — Ambulatory Visit: Payer: Self-pay | Admitting: Pain Medicine

## 2014-03-16 HISTORY — PX: BREAST BIOPSY: SHX20

## 2014-03-29 ENCOUNTER — Ambulatory Visit: Payer: Self-pay | Admitting: Pain Medicine

## 2014-04-03 ENCOUNTER — Encounter: Payer: Self-pay | Admitting: General Surgery

## 2014-04-11 ENCOUNTER — Ambulatory Visit: Payer: Medicare Other | Admitting: General Surgery

## 2014-05-01 ENCOUNTER — Ambulatory Visit: Payer: Self-pay | Admitting: Pain Medicine

## 2014-05-15 ENCOUNTER — Encounter: Payer: Self-pay | Admitting: *Deleted

## 2014-05-25 ENCOUNTER — Ambulatory Visit: Payer: Self-pay | Admitting: Family Medicine

## 2014-05-25 LAB — HM MAMMOGRAPHY

## 2014-05-29 ENCOUNTER — Ambulatory Visit: Payer: Self-pay | Admitting: Pain Medicine

## 2014-06-28 ENCOUNTER — Ambulatory Visit
Admit: 2014-06-28 | Disposition: A | Discharge status: Home / Self Care | Payer: Self-pay | Attending: Pain Medicine | Admitting: Pain Medicine

## 2014-07-03 LAB — LIPID PANEL
CHOLESTEROL: 181 mg/dL (ref 0–200)
HDL: 52 mg/dL (ref 35–70)
LDL Cholesterol: 99 mg/dL
Triglycerides: 152 mg/dL (ref 40–160)

## 2014-07-07 NOTE — H&P (Signed)
PATIENT NAME:  Jacqueline Campbell, Jacqueline Campbell MR#:  [id number] DATE OF BIRTH:  02-10-1961  DATE OF ADMISSION:  10/12/2013  PRIMARY CARE PHYSICIAN: Dr. Ancil Boozer  PRIMARY PULMONOLOGIST: Dr. Raul Del  PRIMARY CARDIOLOGIST: Dr. Clayborn Bigness   CHIEF COMPLAINT: Decreased urine output.   HISTORY OF PRESENT ILLNESS: This is a 54 year old female with a history of anxiety, COPD, bladder issues who self-caths herself, sleep apnea on CPAP and GERD who presents with the above complaint. The patient, as mentioned, self-caths herself and over the past 3 days she has noticed that she has not been able to produce significant amount of urine as she was before. She is on Lasix 20 mg p.o. b.i.d. for lower extremity edema. She and her primary care physician are working with her lower extremity edema. She denies any dysuria, hesitancy, urgency, fever, chills or back pain.  REVIEW OF SYSTEMS: CONSTITUTIONAL: No fever, fatigue, weakness. EYES: No blurred or double vision or glaucoma. ENT: No ear pain, hearing loss. Positive snoring. No discharge or epistaxis. RESPIRATORY: No cough, wheezing, hemoptysis. Positive history of COPD, not on oxygen. CARDIOVASCULAR: No palpitations, orthopnea or syncope. Positive edema. No arrhythmia.  GASTROINTESTINAL: Positive nausea. No vomiting, diarrhea, abdominal pain, melena, or ulcers. GENITOURINARY: No dysuria or hematuria, but she has had decreased urine output.  ENDOCRINE: No polyuria or polydipsia. HEME AND LYMPH: Positive anemia. No bruising, SKIN: No rashes or lesions. MUSCULOSKELETAL: No limited activity. NEUROLOGIC: No history of CVA, TIA or seizures.  PSYCHIATRIC: Positive depression.   PAST MEDICAL HISTORY:  1.  Hypertension.  2.  COPD. 3.  Depression.  4.  Bladder tumor.  5.  Possible neurogenic bladder as the patient self-caths herself.  6.  IBS. 7.  Cervical, ovarian, throat and breast cancer.  8.  Glaucoma.   MEDICATIONS: 1.  Xopenex.  2.  Tizanidine 4 mg 1 tablet 3 to 5  times p.r.n.  3.  Singulair 10 mg daily.  4.  Promethazine 25 mg daily.  5.  Oxycodone 15 mg 1 to 2 tablets every 2 to 4 hours.  6.  Nexium 40 mg b.i.d.  7.  Nasonex 50 mcg 2 sprays daily.  8.  Nadolol 40 mg daily.  9.  Mometasone topical to effected area p.r.n.  10.  Levocetirizine 5 mg daily.  11. K-Chlor 20 mEq daily.  12.  Lasix 20 mg p.o. b.i.d.  13.  EpiPen p.r.n.  14.  Elmiron 100 mg 1 tablet t.i.d.  15.  Dorzolamide ophthalmic solution b.i.d.  16.  Diazepam 5 mg 4 times a day.  17.  Denta 5,000 applied to teeth t.i.d.  18.  Catapres 0.2 mg transdermally weekly.  19.  Azor 5/20 one tablet daily.  20.  Ambien 10 mg at bedtime.  21.  Albuterol inhalation b.i.d.  22.  Advair Diskus 500/50 daily.   ALLERGIES: The patient has multiple allergies including RED DYE, AMLODIPINE, TOVIAZ, NEURONTIN, LAMICTAL, SHELL FISH, OXYBUTYNIN, ABILIFY, MILK, DOXYCYCLINE, TOBRADEX, ASPIRIN, SULFUR, CEFDINIR, CEPHALOSPORINS, PENICILLIN, BETADINE, CIPRO, DARVOCET, STRAWBERRIES, TAPE, LATEX, LEVAQUIN, RISPERDAL, HOT SPOT FLEA MEDICINE, ALBUTEROL, ASPIRIN, TRIMICTAL, IODINE, SULFA.   FAMILY HISTORY: Positive for cancer and COPD.  SOCIAL HISTORY: No tobacco, alcohol or drug use.  PAST SURGICAL HISTORY:  1.  Bilateral eye surgery. 2.  Hysterectomy.  3.  Bladder surgery.   PHYSICAL EXAMINATION: VITAL SIGNS: Temperature 97.5, pulse 88, respirations 18, blood pressure 100/53, 99% on room air.  GENERAL: The patient is alert and oriented, not in acute distress. HEENT: Head is atraumatic, normocephalic. Pupils equal, round and  reactive. Sclerae anicteric. Mucous membranes are moist. Oropharynx is clear.  NECK: Supple without JVD, carotid bruit or enlarged thyroid.  CARDIOVASCULAR: Regular rate and rhythm. No murmurs, gallops or rubs. PMI is not displaced. LUNGS: Clear to auscultation without crackles, rales, rhonchi or wheezing. Normal to percussion. Normal chest expansion.  BACK: No CVA or vertebral  tenderness.  ABDOMEN: Obese, soft. Bowel sounds positive. Nontender. Nondistended. No hepatosplenomegaly.  EXTREMITIES: No clubbing, cyanosis or edema. On lower extremities, she has 3+ bilateral edema. NEUROLOGIC: Cranial nerves II through XII are intact. There are no focal deficits.  SKIN: Without rash or lesions.  DIAGNOSTIC DATA: White blood cells 7.4, hemoglobin 10.5, hematocrit 32, platelets 201,000. Sodium 136, potassium 4.9, chloride 102, bicarb 27, BUN 39, creatinine 2.59, glucose 89. Troponin less than 0.02. Urinalysis shows negative LCE or nitrites.   EKG: Normal sinus rhythm. No ST elevations or depressions.   Chest x-ray shows mild chronic interstitial lung disease. No acute abnormalities.   ASSESSMENT AND PLAN: A 54 year old female who has chronic lower extremity edema being worked up by her primary care physician who presents with decreased urine output and found to have acute renal failure.  1.  Acute renal failure, likely secondary to Lasix with prerenal azotemia . The patient was recently treated with Lasix for her lower extremity edema. We are holding Lasix, provide IV fluids. Place a Foley catheter for strict ins and outs. Obtain a renal ultrasound to evaluate for hydronephrosis. Follow the BMP and hold any nephrotoxic agents.  2.  Lower extremity edema of unclear etiology. The patient does have the diagnosis of obstructive sleep apnea so may be related to underlying obstructive sleep apnea, although she says she is compliant with her CPAP. I have obtained an echocardiogram as well as a TSH and lower extremity Dopplers to evaluate for blood clot. We will hold Lasix at this time.  3.  Hypotension. We will hold her outpatient medications due to her low bloodpressure likley from her acute renal failure and prerenal as azotemia. 4.  Sleep apnea. I will continue with CPAP.   CODE STATUS: FULL.  TIME SPENT: 45 minutes.   ____________________________ Donell Beers. Benjie Karvonen,  MD spm:sb D: 10/12/2013 12:39:17 ET T: 10/12/2013 13:02:57 ET JOB#: [id number]  cc: Philis Doke P. Benjie Karvonen, MD, <Dictator> Bethena Roys. Ancil Boozer, MD Donell Beers Maya Scholer MD ELECTRONICALLY SIGNED 10/12/2013 15:27

## 2014-07-07 NOTE — Discharge Summary (Signed)
PATIENT NAME:  Jacqueline Campbell, Jacqueline Campbell MR#:  [id number] DATE OF BIRTH:  1960-07-02  DATE OF ADMISSION:  10/12/2013 DATE OF DISCHARGE:  10/13/2013   PRESENTING COMPLAINT: Poor urine output.   DISCHARGE DIAGNOSES:  1. Acute renal failure secondary to prerenal azotemia, improved. Likely due to overdiuresis and poor p.o. intake.  2. History of interstitial cystitis.  3. Gastroesophageal reflux disease.  4. History of sleep apnea.   CONDITION ON DISCHARGE: Fair.   CODE STATUS: Full Code.   MEDICATIONS:  1. Mometasone 0.1% topical apply to affected area as needed.  2. Xopenex HFA 2 puffs once a day.  3. Nexium 40 mg b.i.d.  4. Nasonex 50 mcg per inhalation 2 sprays once a day.  5. Elmiron 100 mg 1 capsule t.i.d.  6. Klor-Con 20 mEq extended-release p.o. daily.  7. Catapres 0.2 mg TTS patch once a week on Sunday.  8. Albuterol DuoNebs 3 mL b.i.d.  9. Dorzolamide 2% ophthalmic drops 1 drop to both eyes b.i.d.  10. Diazepam 5 mg 1 tablet 4 times a day.  11. Ambien 10 mg at bedtime.  12. Promethazine 25 mg 1 tablet at bedtime.  13. Lidocaine topical 5% film apply to affected area daily as needed.  14. Azor 5/20 one tablet daily.  15. Nadolol 40 mg daily.  16. Oxycodone 15 mg 1 to 2 orally 2 to 4 times a day.  17. Levocetirizine 5 mg p.o. daily at bedtime. 18. Singulair 10 mg daily.  19. Tizanidine 4 mg p.o. 1 capsule 3 to 5 times a day.  21. EpiPen as needed.  22. Furosemide 20 mg daily.  23. Advair 500/50 one puff b.i.d.   FOLLOWUP: With Dr. Ancil Boozer in 1 to 2 weeks.   LABORATORY DATA: Creatinine at discharge is 1.4. Creatinine at admission was 2.59.   Ultrasound Doppler, lower extremity, negative for DVT.   Ultrasound, kidneys, normal.   Echocardiogram-Doppler showed an ejection fraction of 60% to 65%, normal global left ventricular systolic function, normal right ventricular size and systolic function.   BRIEF SUMMARY OF HOSPITAL COURSE:  1. Jacqueline Campbell is a morbidly obese,  54 year old Caucasian female, with history of chronic lower extremity edema being worked up by her primary care physician, presented with decreased urine output. She was found to have acute renal failure secondary to Lasix, and prerenal azotemia. The patient was recently treated with increase dose of  Lasix. She came in with creatinine of 2.59, received IV fluids. Creatinine came down to 1.4. Her urine output was more than 4 liters in 24 hours. Her Foley was discontinued. She was changed to Lasix 20 mg daily. 2. Lower extremity edema of unclear etiology. Ultrasound Doppler was negative for DVT. Ultrasound of kidneys was negative, as well. She is also recommended to follow up with primary care physician as outpatient and has to wear TED hose. 3. Hypertension, resolved with hydration. 4. Sleep apnea. The patient was recommended to continue CPAP.   Hospital stay otherwise remained stable.   CODE STATUS: She remained a Full Code.   TIME SPENT: 40 minutes.   ____________________________ Hart Rochester Posey Pronto, MD sap:jr D: 10/13/2013 14:21:28 ET T: 10/13/2013 15:32:56 ET JOB#: [id number]  cc: Garnie Borchardt A. Posey Pronto, MD, <Dictator> Bethena Roys. Ancil Boozer, MD Ilda Basset MD ELECTRONICALLY SIGNED 11/03/2013 14:45

## 2014-07-30 ENCOUNTER — Ambulatory Visit: Payer: Medicare Other | Attending: Pain Medicine | Admitting: Pain Medicine

## 2014-07-30 ENCOUNTER — Encounter: Payer: Self-pay | Admitting: Pain Medicine

## 2014-07-30 VITALS — BP 156/112 | HR 89 | Temp 98.2°F | Resp 16 | Ht 67.0 in

## 2014-07-30 DIAGNOSIS — M5030 Other cervical disc degeneration, unspecified cervical region: Secondary | ICD-10-CM | POA: Insufficient documentation

## 2014-07-30 DIAGNOSIS — M5136 Other intervertebral disc degeneration, lumbar region: Secondary | ICD-10-CM | POA: Insufficient documentation

## 2014-07-30 DIAGNOSIS — M533 Sacrococcygeal disorders, not elsewhere classified: Secondary | ICD-10-CM | POA: Insufficient documentation

## 2014-07-30 DIAGNOSIS — M179 Osteoarthritis of knee, unspecified: Secondary | ICD-10-CM | POA: Insufficient documentation

## 2014-07-30 DIAGNOSIS — M461 Sacroiliitis, not elsewhere classified: Secondary | ICD-10-CM | POA: Insufficient documentation

## 2014-07-30 DIAGNOSIS — M542 Cervicalgia: Secondary | ICD-10-CM | POA: Diagnosis present

## 2014-07-30 DIAGNOSIS — M961 Postlaminectomy syndrome, not elsewhere classified: Secondary | ICD-10-CM | POA: Insufficient documentation

## 2014-07-30 DIAGNOSIS — M5134 Other intervertebral disc degeneration, thoracic region: Secondary | ICD-10-CM | POA: Insufficient documentation

## 2014-07-30 DIAGNOSIS — M549 Dorsalgia, unspecified: Secondary | ICD-10-CM | POA: Diagnosis present

## 2014-07-30 MED ORDER — OXYCODONE HCL ER 15 MG PO T12A: EXTENDED_RELEASE_TABLET | ORAL | Status: DC

## 2014-07-30 MED ORDER — TIZANIDINE HCL 4 MG PO CAPS: ORAL_CAPSULE | ORAL | Status: DC

## 2014-07-30 NOTE — Progress Notes (Signed)
   Subjective:    Patient ID: Jacqueline Campbell, female    DOB: 10-06-1960, 54 y.o.   MRN: [id number]  HPI    Review of Systems     Objective:   Physical Exam        Assessment & Plan:

## 2014-07-30 NOTE — Progress Notes (Signed)
Return in 1 month for evaluation. Patient instructed that she needs psych eval. Uds results reviewed by Dr Primus Bravo.  Script given for oxycodone.

## 2014-07-30 NOTE — Patient Instructions (Addendum)
Continue present medications.  F/U PCP for evaliation of  BP and general medical  condition.  Psych evaluation and treatment as discussed  F/U surgical evaluation.  F/U nrurological evaluation.  May consider radiofrequency rhizolysis or intraspinal procedures pending response to present treatment and F/U evaluation.  [facility] to [facility] should patient have concerns prior to scheduled return appointment.
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
## 2014-08-27 ENCOUNTER — Other Ambulatory Visit: Payer: Self-pay | Admitting: Family Medicine

## 2014-08-27 NOTE — Telephone Encounter (Signed)
Pt needs refill

## 2014-08-28 ENCOUNTER — Encounter: Payer: Self-pay | Admitting: Pain Medicine

## 2014-08-28 ENCOUNTER — Ambulatory Visit: Payer: Medicare Other | Attending: Pain Medicine | Admitting: Pain Medicine

## 2014-08-28 VITALS — BP 122/78 | HR 81 | Temp 98.3°F | Resp 16 | Ht 67.0 in | Wt 196.0 lb

## 2014-08-28 DIAGNOSIS — M5136 Other intervertebral disc degeneration, lumbar region: Secondary | ICD-10-CM | POA: Insufficient documentation

## 2014-08-28 DIAGNOSIS — R51 Headache: Secondary | ICD-10-CM | POA: Diagnosis not present

## 2014-08-28 DIAGNOSIS — M170 Bilateral primary osteoarthritis of knee: Secondary | ICD-10-CM | POA: Insufficient documentation

## 2014-08-28 DIAGNOSIS — M5481 Occipital neuralgia: Secondary | ICD-10-CM | POA: Diagnosis not present

## 2014-08-28 DIAGNOSIS — M533 Sacrococcygeal disorders, not elsewhere classified: Secondary | ICD-10-CM | POA: Diagnosis not present

## 2014-08-28 DIAGNOSIS — M5030 Other cervical disc degeneration, unspecified cervical region: Secondary | ICD-10-CM | POA: Insufficient documentation

## 2014-08-28 DIAGNOSIS — M5126 Other intervertebral disc displacement, lumbar region: Secondary | ICD-10-CM | POA: Insufficient documentation

## 2014-08-28 DIAGNOSIS — M542 Cervicalgia: Secondary | ICD-10-CM | POA: Diagnosis present

## 2014-08-28 DIAGNOSIS — M179 Osteoarthritis of knee, unspecified: Secondary | ICD-10-CM | POA: Insufficient documentation

## 2014-08-28 DIAGNOSIS — M961 Postlaminectomy syndrome, not elsewhere classified: Secondary | ICD-10-CM

## 2014-08-28 DIAGNOSIS — M5134 Other intervertebral disc degeneration, thoracic region: Secondary | ICD-10-CM

## 2014-08-28 DIAGNOSIS — M47816 Spondylosis without myelopathy or radiculopathy, lumbar region: Secondary | ICD-10-CM | POA: Insufficient documentation

## 2014-08-28 DIAGNOSIS — M546 Pain in thoracic spine: Secondary | ICD-10-CM | POA: Diagnosis present

## 2014-08-28 DIAGNOSIS — M1710 Unilateral primary osteoarthritis, unspecified knee: Secondary | ICD-10-CM | POA: Insufficient documentation

## 2014-08-28 MED ORDER — TIZANIDINE HCL 4 MG PO CAPS: ORAL_CAPSULE | ORAL | Status: DC

## 2014-08-28 MED ORDER — OXYCODONE HCL 15 MG PO TABS: ORAL_TABLET | ORAL | Status: DC

## 2014-08-28 NOTE — Progress Notes (Signed)
   Subjective:    Patient ID: Jacqueline Campbell, female    DOB: 1960-12-10, 54 y.o.   MRN: [id number]  HPI  Patient is 54 year old female who [facility] and treatment of pain involving the neck and entire back upper and lower extremity regions some pain involving the knees at this time. Patient states that she has severe pain of the knees standing walking and even with sitting. Patient denies any trauma change in events of daily living to call significant change in symptomatology. Patient states that the pain is most severe after standing or walking for any significant period of time. The pain continues while patient is resting as well patient stands up walks for a long period of time. We will proceed with interventional treatment in attempt to decrease patient's symptoms as well as proceed with further evaluation of patient's condition as discussed. We will proceed with geniculate nerve blocks of the knee at time return appointment in attempt to decrease severity of symptoms, minimize progression of symptoms, and hopefully avoid the need for more involved treatment. The patient was understanding and agree with suggested treatment plan     Review of Systems     Objective:   Physical Exam  There was tennis over the splenius capitis and occipitalis musculature region of mild degree with mild tenderness over the cervical facet cervical paraspinal musculature region. No new lesions of the head and neck were noted. Palpation of the acromioclavicular glenohumeral joint regions reproduced mild discomfort and patient appeared to be with bilaterally equal grip strength. Tinel's and Phalen's maneuver were without increase of pain of any significant degree. Palpation over the thoracic facet thoracic paraspinal musculature region was associated with muscle spasms in the lower thoracic region predominantly. There was no crepitus of the thoracic region noted. Palpation  over the lumbar paraspinal musculature region lumbar facet region associated with moderate discomfort with lateral bending and rotation reproducing moderate discomfort. Straight leg raising was tolerates approximately 20 and there appeared to be decreased EHL strength. No definite sensory deficit of dermatomal distribution detected. There was negative clonus negative Homans. Examination of the knee reveal patient to be with crepitus of the knee with negative anterior and posterior drawer signs. There was no increased warmth or erythema of the knee noted and no new lesions of the knee were noted . There was significant crepitus with range of motion of the knee. Mild tenderness of the greater trochanteric region. Abdomen nontender and no costovertebral angle tenderness noted      Assessment & Plan:  Degenerative disc disease lumbar spine Degenerative changes of the lumbar spine with L4-L5 degenerative changes and central disc protrusion  Degenerative joint disease of knees  Degenerative disc disease of the cervical spine C5-6 right-sided involvement most progressed with compression of the C5 nerve root  Sacroiliac joint dysfunction  Lumbar facet syndrome  Greater occipital neuralgia  Cervicogenic headaches    Plan  Continue present medications. Oxycodone and try tizanidine  Geniculate nerve blocks of the knee to be performed at time of return appointment  F/U PCP for evaliation of  BP and general medical  condition.  F/U surgical evaluation.  F/U neurological evaluation.  May consider radiofrequency rhizolysis or intraspinal procedures pending response to present treatment and F/U evaluation.  Patient to call [facility] should patient have concerns prior to scheduled return appointment.

## 2014-08-28 NOTE — Patient Instructions (Addendum)
Continue present medications. Oxycodone and  Tizanidine  Geniculate nerve blocks of knee Wednesday, 09/05/2014  F/U PCP for evaliation of  BP and general medical  Condition.  Continue psych evaluations  F/U surgical evaluation.  F/U neurological evaluation.  May consider radiofrequency rhizolysis or intraspinal procedures pending response to present treatment and F/U evaluation.  Patient to call [facility] should patient have concerns prior to scheduled return appointment. GENERAL RISKS AND COMPLICATIONS  What are the risk, side effects and possible complications? Generally speaking, most procedures are safe.  However, with any procedure there are risks, side effects, and the possibility of complications.  The risks and complications are dependent upon the sites that are lesioned, or the type of nerve block to be performed.  The closer the procedure is to the spine, the more serious the risks are.  Great care is taken when placing the radio frequency needles, block needles or lesioning probes, but sometimes complications can occur. 1. Infection: Any time there is an injection through the skin, there is a risk of infection.  This is why sterile conditions are used for these blocks.  There are four possible types of infection. 1. Localized skin infection. 2. Central Nervous System Infection-This can be in the form of Meningitis, which can be deadly. 3. Epidural Infections-This can be in the form of an epidural abscess, which can cause pressure inside of the spine, causing compression of the spinal cord with subsequent paralysis. This would require an emergency surgery to decompress, and there are no guarantees that the patient would recover from the paralysis. 4. Discitis-This is an infection of the intervertebral discs.  It occurs in about 1% of discography procedures.  It is difficult to treat and it may lead to surgery.        2. Pain: the needles have to go through skin and soft  tissues, will cause soreness.       3. Damage to internal structures:  The nerves to be lesioned may be near blood vessels or    other nerves which can be potentially damaged.       4. Bleeding: Bleeding is more common if the patient is taking blood thinners such as  aspirin, Coumadin, Ticiid, Plavix, etc., or if he/she have some genetic predisposition  such as hemophilia. Bleeding into the spinal canal can cause compression of the spinal  cord with subsequent paralysis.  This would require an emergency surgery to  decompress and there are no guarantees that the patient would recover from the  paralysis.       5. Pneumothorax:  Puncturing of a lung is a possibility, every time a needle is introduced in  the area of the chest or upper back.  Pneumothorax refers to free air around the  collapsed lung(s), inside of the thoracic cavity (chest cavity).  Another two possible  complications related to a similar event would include: Hemothorax and Chylothorax.   These are variations of the Pneumothorax, where instead of air around the collapsed  lung(s), you may have blood or chyle, respectively.       6. Spinal headaches: They may occur with any procedures in the area of the spine.       7. Persistent CSF (Cerebro-Spinal Fluid) leakage: This is a rare problem, but may occur  with prolonged intrathecal or epidural catheters either due to the formation of a fistulous  track or a dural tear.       8. Nerve damage: By working so close  to the spinal cord, there is always a possibility of  nerve damage, which could be as serious as a permanent spinal cord injury with  paralysis.       9. Death:  Although rare, severe deadly allergic reactions known as "Anaphylactic  reaction" can occur to any of the medications used.      10. Worsening of the symptoms:  We can always make thing worse.  What are the chances of something like this happening? Chances of any of this occuring are extremely low.  By statistics, you have  more of a chance of getting killed in a motor vehicle accident: while driving to the hospital than any of the above occurring .  Nevertheless, you should be aware that they are possibilities.  In general, it is similar to taking a shower.  Everybody knows that you can slip, hit your head and get killed.  Does that mean that you should not shower again?  Nevertheless always keep in mind that statistics do not mean anything if you happen to be on the wrong side of them.  Even if a procedure has a 1 (one) in a 1,000,000 (million) chance of going wrong, it you happen to be that one..Also, keep in mind that by statistics, you have more of a chance of having something go wrong when taking medications.  Who should not have this procedure? If you are on a blood thinning medication (e.g. Coumadin, Plavix, see list of "Blood Thinners"), or if you have an active infection going on, you should not have the procedure.  If you are taking any blood thinners, please inform your physician.  How should I prepare for this procedure?  Do not eat or drink anything at least six hours prior to the procedure.  Bring a driver with you .  It cannot be a taxi.  Come accompanied by an adult that can drive you back, and that is strong enough to help you if your legs get weak or numb from the local anesthetic.  Take all of your medicines the morning of the procedure with just enough water to swallow them.  If you have diabetes, make sure that you are scheduled to have your procedure done first thing in the morning, whenever possible.  If you have diabetes, take only half of your insulin dose and notify our nurse that you have done so as soon as you arrive at the clinic.  If you are diabetic, but only take blood sugar pills (oral hypoglycemic), then do not take them on the morning of your procedure.  You may take them after you have had the procedure.  Do not take aspirin or any aspirin-containing medications, at least eleven  (11) days prior to the procedure.  They may prolong bleeding.  Wear loose fitting clothing that may be easy to take off and that you would not mind if it got stained with Betadine or blood.  Do not wear any jewelry or perfume  Remove any nail coloring.  It will interfere with some of our monitoring equipment.  NOTE: Remember that this is not meant to be interpreted as a complete list of all possible complications.  Unforeseen problems may occur.  BLOOD THINNERS The following drugs contain aspirin or other products, which can cause increased bleeding during surgery and should not be taken for 2 weeks prior to and 1 week after surgery.  If you should need take something for relief of minor pain, you may take acetaminophen which is  found in Tylenol,m Datril, Anacin-3 and Panadol. It is not blood thinner. The products listed below are.  Do not take any of the products listed below in addition to any listed on your instruction sheet.  A.P.C or A.P.C with Codeine Codeine Phosphate Capsules #3 Ibuprofen Ridaura  ABC compound Congesprin Imuran rimadil  Advil Cope Indocin Robaxisal  Alka-Seltzer Effervescent Pain Reliever and Antacid Coricidin or Coricidin-D  Indomethacin Rufen  Alka-Seltzer plus Cold Medicine Cosprin Ketoprofen S-A-C Tablets  Anacin Analgesic Tablets or Capsules Coumadin Korlgesic Salflex  Anacin Extra Strength Analgesic tablets or capsules CP-2 Tablets Lanoril Salicylate  Anaprox Cuprimine Capsules Levenox Salocol  Anexsia-D Dalteparin Magan Salsalate  Anodynos Darvon compound Magnesium Salicylate Sine-off  Ansaid Dasin Capsules Magsal Sodium Salicylate  Anturane Depen Capsules Marnal Soma  APF Arthritis pain formula Dewitt's Pills Measurin Stanback  Argesic Dia-Gesic Meclofenamic Sulfinpyrazone  Arthritis Bayer Timed Release Aspirin Diclofenac Meclomen Sulindac  Arthritis pain formula Anacin Dicumarol Medipren Supac  Analgesic (Safety coated) Arthralgen Diffunasal Mefanamic  Suprofen  Arthritis Strength Bufferin Dihydrocodeine Mepro Compound Suprol  Arthropan liquid Dopirydamole Methcarbomol with Aspirin Synalgos  ASA tablets/Enseals Disalcid Micrainin Tagament  Ascriptin Doan's Midol Talwin  Ascriptin A/D Dolene Mobidin Tanderil  Ascriptin Extra Strength Dolobid Moblgesic Ticlid  Ascriptin with Codeine Doloprin or Doloprin with Codeine Momentum Tolectin  Asperbuf Duoprin Mono-gesic Trendar  Aspergum Duradyne Motrin or Motrin IB Triminicin  Aspirin plain, buffered or enteric coated Durasal Myochrisine Trigesic  Aspirin Suppositories Easprin Nalfon Trillsate  Aspirin with Codeine Ecotrin Regular or Extra Strength Naprosyn Uracel  Atromid-S Efficin Naproxen Ursinus  Auranofin Capsules Elmiron Neocylate Vanquish  Axotal Emagrin Norgesic Verin  Azathioprine Empirin or Empirin with Codeine Normiflo Vitamin E  Azolid Emprazil Nuprin Voltaren  Bayer Aspirin plain, buffered or children's or timed BC Tablets or powders Encaprin Orgaran Warfarin Sodium  Buff-a-Comp Enoxaparin Orudis Zorpin  Buff-a-Comp with Codeine Equegesic Os-Cal-Gesic   Buffaprin Excedrin plain, buffered or Extra Strength Oxalid   Bufferin Arthritis Strength Feldene Oxphenbutazone   Bufferin plain or Extra Strength Feldene Capsules Oxycodone with Aspirin   Bufferin with Codeine Fenoprofen Fenoprofen Pabalate or Pabalate-SF   Buffets II Flogesic Panagesic   Buffinol plain or Extra Strength Florinal or Florinal with Codeine Panwarfarin   Buf-Tabs Flurbiprofen Penicillamine   Butalbital Compound Four-way cold tablets Penicillin   Butazolidin Fragmin Pepto-Bismol   Carbenicillin Geminisyn Percodan   Carna Arthritis Reliever Geopen Persantine   Carprofen Gold's salt Persistin   Chloramphenicol Goody's Phenylbutazone   Chloromycetin Haltrain Piroxlcam   Clmetidine heparin Plaquenil   Cllnoril Hyco-pap Ponstel   Clofibrate Hydroxy chloroquine Propoxyphen         Before stopping any of these  medications, be sure to consult the physician who ordered them.  Some, such as Coumadin (Warfarin) are ordered to prevent or treat serious conditions such as "deep thrombosis", "pumonary embolisms", and other heart problems.  The amount of time that you may need off of the medication may also vary with the medication and the reason for which you were taking it.  If you are taking any of these medications, please make sure you notify your pain physician before you undergo any procedures.         Knee Injection Joint injections are shots. Your caregiver will place a needle into your knee joint. The needle is used to put medicine into the joint. These shots can be used to help treat different painful knee conditions such as osteoarthritis, bursitis, local flare-ups of rheumatoid arthritis, and pseudogout. Anti-inflammatory medicines  such as corticosteroids and anesthetics are the most common medicines used for joint and soft tissue injections.  PROCEDURE 2. The skin over the kneecap will be cleaned with an antiseptic solution. 3. Your caregiver will inject a small amount of a local anesthetic (a medicine like Novocaine) just under the skin in the area that was cleaned. 4. After the area becomes numb, a second injection is done. This second injection usually includes an anesthetic and an anti-inflammatory medicine called a steroid or cortisone. The needle is carefully placed in between the kneecap and the knee, and the medicine is injected into the joint space. 5. After the injection is done, the needle is removed. Your caregiver may place a bandage over the injection site. The whole procedure takes no more than a couple of minutes. BEFORE THE PROCEDURE  Wash all of the skin around the entire knee area. Try to remove any loose, scaling skin. There is no other specific preparation necessary unless advised otherwise by your caregiver. LET YOUR CAREGIVER KNOW ABOUT:   Allergies.  Medications taken  including herbs, eye drops, over the counter medications, and creams.  Use of steroids (by mouth or creams).  Possible pregnancy, if applicable.  Previous problems with anesthetics or Novocaine.  History of blood clots (thrombophlebitis).  History of bleeding or blood problems.  Previous surgery.  Other health problems. RISKS AND COMPLICATIONS Side effects from cortisone shots are rare. They include:   Slight bruising of the skin.  Shrinkage of the normal fatty tissue under the skin where the shot was given.  Increase in pain after the shot.  Infection.  Weakening of tendons or tendon rupture.  Allergic reaction to the medicine.  Diabetics may have a temporary increase in their blood sugar after a shot.  Cortisone can temporarily weaken the immune system. While receiving these shots, you should not get certain vaccines. Also, avoid contact with anyone who has chickenpox or measles. Especially if you have never had these diseases or have not been previously immunized. Your immune system may not be strong enough to fight off the infection while the cortisone is in your system. AFTER THE PROCEDURE   You can go home after the procedure.  You may need to put ice on the joint 15-20 minutes every 3 or 4 hours until the pain goes away.  You may need to put an elastic bandage on the joint. HOME CARE INSTRUCTIONS  12. Only take over-the-counter or prescription medicines for pain, discomfort, or fever as directed by your caregiver. 13. You should avoid stressing the joint. Unless advised otherwise, avoid activities that put a lot of pressure on a knee joint, such as: 1. Jogging. 2. Bicycling. 3. Recreational climbing. 4. Hiking. 14. Laying down and elevating the leg/knee above the level of your heart can help to minimize swelling. SEEK MEDICAL CARE IF:   You have repeated or worsening swelling.  There is drainage from the puncture area.  You develop red streaking that  extends above or below the site where the needle was inserted. SEEK IMMEDIATE MEDICAL CARE IF:   You develop a fever.  You have pain that gets worse even though you are taking pain medicine.  The area is red and warm, and you have trouble moving the joint. MAKE SURE YOU:   Understand these instructions.  Will watch your condition.  Will get help right away if you are not doing well or get worse. Document Released: 05/24/2006 Document Revised: 05/25/2011 Document Reviewed: 02/18/2007 [employer] Patient Information  2015 [employer]. This information is not intended to replace advice given to you by your health care provider. Make sure you discuss any questions you have with your health care provider. Pain Management Discharge Instructions  General Discharge Instructions :  If you need to reach your doctor call: Monday-Friday 8:00 am - 4:00 pm at [phone] or toll free [phone].  After clinic hours [phone] to have operator reach doctor.  Bring all of your medication bottles to all your appointments in the pain clinic.  To cancel or reschedule your appointment with Pain Management please remember to call 24 hours in advance to avoid a fee.  Refer to the educational materials which you have been given on: General Risks, I had my Procedure. Discharge Instructions, Post Sedation.  Post Procedure Instructions:  The drugs you were given will stay in your system until tomorrow, so for the next 24 hours you should not drive, make any legal decisions or drink any alcoholic beverages.  You may eat anything you prefer, but it is better to start with liquids then soups and crackers, and gradually work up to solid foods.  Please notify your doctor immediately if you have any unusual bleeding, trouble breathing or pain that is not related to your normal pain.  Depending on the type of procedure that was done, some parts of your body may feel week and/or numb.  This usually clears  up by tonight or the next day.  Walk with the use of an assistive device or accompanied by an adult for the 24 hours.  You may use ice on the affected area for the first 24 hours.  Put ice in a Ziploc bag and cover with a towel and place against area 15 minutes on 15 minutes off.  You may switch to heat after 24 hours.

## 2014-08-28 NOTE — Progress Notes (Signed)
Safety precautions to be maintained throughout the outpatient stay will include: orient to surroundings, keep bed in low position, maintain call bell within reach at all times, provide assistance with transfer out of bed and ambulation.  

## 2014-08-28 NOTE — Progress Notes (Signed)
Discharged to home ambulatory. Pre-procedure instructions given. Scripts given as ordered. Ida

## 2014-08-28 NOTE — Progress Notes (Deleted)
   Subjective:    Patient ID: Jacqueline Campbell, female    DOB: 06-Dec-1960, 54 y.o.   MRN: [id number]  HPI    Review of Systems     Objective:   Physical Exam        Assessment & Plan:

## 2014-09-05 ENCOUNTER — Encounter: Payer: Self-pay | Admitting: Pain Medicine

## 2014-09-05 ENCOUNTER — Ambulatory Visit: Payer: Medicare Other | Attending: Pain Medicine | Admitting: Pain Medicine

## 2014-09-05 VITALS — BP 138/80 | HR 73 | Temp 97.2°F | Resp 18 | Ht 67.5 in | Wt 196.0 lb

## 2014-09-05 DIAGNOSIS — M1712 Unilateral primary osteoarthritis, left knee: Secondary | ICD-10-CM | POA: Diagnosis not present

## 2014-09-05 DIAGNOSIS — M5134 Other intervertebral disc degeneration, thoracic region: Secondary | ICD-10-CM

## 2014-09-05 DIAGNOSIS — M79605 Pain in left leg: Secondary | ICD-10-CM | POA: Diagnosis present

## 2014-09-05 DIAGNOSIS — M545 Low back pain: Secondary | ICD-10-CM | POA: Diagnosis present

## 2014-09-05 DIAGNOSIS — M170 Bilateral primary osteoarthritis of knee: Secondary | ICD-10-CM

## 2014-09-05 DIAGNOSIS — M533 Sacrococcygeal disorders, not elsewhere classified: Secondary | ICD-10-CM

## 2014-09-05 DIAGNOSIS — M79604 Pain in right leg: Secondary | ICD-10-CM | POA: Diagnosis present

## 2014-09-05 DIAGNOSIS — M5030 Other cervical disc degeneration, unspecified cervical region: Secondary | ICD-10-CM

## 2014-09-05 DIAGNOSIS — M961 Postlaminectomy syndrome, not elsewhere classified: Secondary | ICD-10-CM

## 2014-09-05 DIAGNOSIS — M5136 Other intervertebral disc degeneration, lumbar region: Secondary | ICD-10-CM

## 2014-09-05 MED ORDER — BUPIVACAINE HCL (PF) 0.25 % IJ SOLN
INTRAMUSCULAR | Status: AC
  Administered 2014-09-05: 08:00:00
  Filled 2014-09-05: qty 30

## 2014-09-05 MED ORDER — AZITHROMYCIN 250 MG PO TABS: ORAL_TABLET | ORAL | Status: DC

## 2014-09-05 MED ORDER — MIDAZOLAM HCL 5 MG/5ML IJ SOLN
INTRAMUSCULAR | Status: AC
  Administered 2014-09-05: 5 mg via INTRAVENOUS
  Filled 2014-09-05: qty 5

## 2014-09-05 MED ORDER — TRIAMCINOLONE ACETONIDE 40 MG/ML IJ SUSP
INTRAMUSCULAR | Status: AC
  Administered 2014-09-05: 08:00:00
  Filled 2014-09-05: qty 1

## 2014-09-05 MED ORDER — FENTANYL CITRATE (PF) 100 MCG/2ML IJ SOLN
INTRAMUSCULAR | Status: AC
  Administered 2014-09-05: 100 ug via INTRAVENOUS
  Filled 2014-09-05: qty 2

## 2014-09-05 NOTE — Progress Notes (Signed)
   Subjective:    Patient ID: Jacqueline Campbell, female    DOB: 1960/09/18, 54 y.o.   MRN: [id number]  HPI    Review of Systems     Objective:   Physical Exam        Assessment & Plan:

## 2014-09-05 NOTE — Progress Notes (Signed)
Subjective:    Patient ID: Jacqueline Campbell, female    DOB: 06-25-1960, 54 y.o.   MRN: [id number]  HPI   Geniculate nerve blocks of the left knee   The patient is a 54 y.o. female who returns to the [facility] for further evaluation and treatment of pain involving the lumbar lower extremity region with severe pain of the left knee. Prior studies have revealed patient to be with significant degenerative joint disease of the knee.  We will proceed with geniculate nerve blocks of the left knee in an attempt to decrease severity of symptoms, minimize the risk of medication escalation, hopefully retard progression of symptoms and avoid the need for more involved treatment.  The risks benefits and expectations of the procedure were discussed with the patient. The patient was with understanding and in agreement with suggested treatment plan.  DESCRIPTION OF PROCEDURE: Geniculate nerve blocks of the left knee. The  procedure was performed with IV Versed and IV fentanyl, conscious sedation and under fluoroscopic guidance.  NEEDLE PLACEMENT FOR BLOCK OF THE LATERAL SUPERIOR GENICULATE NERVE: The patient was taking to the fluoroscopy suite. With the patient supine, with knee in flexed position, Betadine prep of proposed entry site accomplished.  IV Versed, IV fentanyl conscious sedation, EKG, blood pressure, pulse and pulse oximetry monitoring were all in place. Under fluoroscopic guidance, a 22-gauge needle was inserted in the region of the left knee with needle placed at the lateral border of the femur at the junction of the shaft of the femur and the condyle of the femur.  Following needle placement at the lateral aspect of the knee, needle placement was then accomplished in the region of the medial aspect of the knee.  NEEDLE PLACEMENT FOR BLOCK OF THE MEDIAL SUPERIOR GENICULATE NERVE:  Under fluoroscopic guidance, a 22 - gauge needle was inserted in the region of the left knee with needle placed  at the medial border of the femur at the junction of the shaft of the femur and the condyle of the femur.   NEEDLE PLACEMENT FOR BLOCK OF THE MEDIAL INFERIOR GENICULATE NERVE:  Under fluoroscopic guidance, a 22 - gauge needle was inserted in the region of the left knee with needle placed at the junction of the shaft and plateau of the tibia.   Following needle placement on AP view of needles placed in all three locations, placement was then verified on lateral view with the tips of the superior lateral and superior medial needles documented to be one half the distance of the shaft of the femur and the tip of the inferior medial geniculate needle documented to be one half the distance of the shaft of the tibia.  Following documentation of needle placements on lateral view, each needle was injected with one mL of 0.25% bupivacaine with Kenalog. A total of 10 mg of Kenalog was utilized for the entire procedure. The patient tolerated the procedure well.    PLAN 1. Medications: Continue present medications oxycodone and tizanidine 2. Follow-up appointment with PCP for evaluation of blood pressure and general medical condition. 3. Follow-up surgical evaluation 4. Follow-up neurological evaluation 5. He patient may be a candidate for radiofrequency rhizolysis and other treatment pending response to treatment on today's visit and follow-up evaluation. 6. The patient is advised to adhere to proper body mechanics and avoid activities which appear to aggravate condition   Review of Systems     Objective:   Physical Exam  Assessment & Plan:

## 2014-09-05 NOTE — Progress Notes (Signed)
Tolerating po fluids well. No c/o nausea.

## 2014-09-05 NOTE — Progress Notes (Signed)
Safety precautions to be maintained throughout the outpatient stay will include: orient to surroundings, keep bed in low position, maintain call bell within reach at all times, provide assistance with transfer out of bed and ambulation.  

## 2014-09-05 NOTE — Patient Instructions (Addendum)
Continue present medications and antibiotics. Please obtain your antibiotic, azithromycin, today and begin taking antibiotic today. As you stated you can tolerate azithromycin; therefore, you will receive azithromycin prescription today.  F/U PCP for evaliation of  BP and general medical  condition.  F/U surgical evaluation as discussed  F/U neurological evaluation.  May consider radiofrequency rhizolysis or intraspinal procedures pending response to present treatment and F/U evaluation.  Patient to call Pain Management Center should patient have concerns prior to scheduled return appointment.    Pain Management Discharge Instructions  General Discharge Instructions :  If you need to reach your doctor call: Monday-Friday 8:00 am - 4:00 pm at [phone] or toll free [phone].  After clinic hours [phone] to have operator reach doctor.  Bring all of your medication bottles to all your appointments in the pain clinic.  To cancel or reschedule your appointment with Pain Management please remember to call 24 hours in advance to avoid a fee.  Refer to the educational materials which you have been given on: General Risks, I had my Procedure. Discharge Instructions, Post Sedation.  Post Procedure Instructions:  The drugs you were given will stay in your system until tomorrow, so for the next 24 hours you should not drive, make any legal decisions or drink any alcoholic beverages.  You may eat anything you prefer, but it is better to start with liquids then soups and crackers, and gradually work up to solid foods.  Please notify your doctor immediately if you have any unusual bleeding, trouble breathing or pain that is not related to your normal pain.  Depending on the type of procedure that was done, some parts of your body may feel week and/or numb.  This usually clears up by tonight or the next day.  Walk with the use of an assistive device or accompanied by an adult for the  24 hours.  You may use ice on the affected area for the first 24 hours.  Put ice in a Ziploc bag and cover with a towel and place against area 15 minutes on 15 minutes off.  You may switch to heat after 24 hours.  A prescription for [facility] was given to you today.

## 2014-09-06 ENCOUNTER — Telehealth: Payer: Self-pay | Admitting: *Deleted

## 2014-09-06 NOTE — Telephone Encounter (Signed)
Denies any complications post procedure

## 2014-09-14 ENCOUNTER — Ambulatory Visit (INDEPENDENT_AMBULATORY_CARE_PROVIDER_SITE_OTHER): Payer: Medicare Other | Admitting: Family Medicine

## 2014-09-14 ENCOUNTER — Encounter: Payer: Self-pay | Admitting: Family Medicine

## 2014-09-14 ENCOUNTER — Other Ambulatory Visit: Payer: Self-pay | Admitting: Family Medicine

## 2014-09-14 VITALS — BP 136/84 | HR 95 | Temp 98.5°F | Resp 16 | Ht 66.0 in | Wt 258.7 lb

## 2014-09-14 DIAGNOSIS — R110 Nausea: Secondary | ICD-10-CM | POA: Insufficient documentation

## 2014-09-14 DIAGNOSIS — Z124 Encounter for screening for malignant neoplasm of cervix: Secondary | ICD-10-CM

## 2014-09-14 DIAGNOSIS — Z0000 Encounter for general adult medical examination without abnormal findings: Secondary | ICD-10-CM | POA: Diagnosis not present

## 2014-09-14 DIAGNOSIS — Z6841 Body Mass Index (BMI) 40.0 and over, adult: Secondary | ICD-10-CM

## 2014-09-14 DIAGNOSIS — H4010X0 Unspecified open-angle glaucoma, stage unspecified: Secondary | ICD-10-CM | POA: Insufficient documentation

## 2014-09-14 DIAGNOSIS — E785 Hyperlipidemia, unspecified: Secondary | ICD-10-CM | POA: Insufficient documentation

## 2014-09-14 DIAGNOSIS — Z1239 Encounter for other screening for malignant neoplasm of breast: Secondary | ICD-10-CM

## 2014-09-14 DIAGNOSIS — Z1211 Encounter for screening for malignant neoplasm of colon: Secondary | ICD-10-CM

## 2014-09-14 DIAGNOSIS — Z9989 Dependence on other enabling machines and devices: Secondary | ICD-10-CM

## 2014-09-14 DIAGNOSIS — M7989 Other specified soft tissue disorders: Secondary | ICD-10-CM | POA: Insufficient documentation

## 2014-09-14 DIAGNOSIS — K219 Gastro-esophageal reflux disease without esophagitis: Secondary | ICD-10-CM | POA: Insufficient documentation

## 2014-09-14 DIAGNOSIS — E8881 Metabolic syndrome: Secondary | ICD-10-CM | POA: Insufficient documentation

## 2014-09-14 DIAGNOSIS — G8929 Other chronic pain: Secondary | ICD-10-CM | POA: Insufficient documentation

## 2014-09-14 DIAGNOSIS — N3010 Interstitial cystitis (chronic) without hematuria: Secondary | ICD-10-CM | POA: Insufficient documentation

## 2014-09-14 DIAGNOSIS — F411 Generalized anxiety disorder: Secondary | ICD-10-CM | POA: Insufficient documentation

## 2014-09-14 DIAGNOSIS — N6019 Diffuse cystic mastopathy of unspecified breast: Secondary | ICD-10-CM | POA: Insufficient documentation

## 2014-09-14 DIAGNOSIS — G4700 Insomnia, unspecified: Secondary | ICD-10-CM | POA: Insufficient documentation

## 2014-09-14 DIAGNOSIS — J3089 Other allergic rhinitis: Secondary | ICD-10-CM | POA: Insufficient documentation

## 2014-09-14 DIAGNOSIS — I10 Essential (primary) hypertension: Secondary | ICD-10-CM | POA: Insufficient documentation

## 2014-09-14 DIAGNOSIS — K589 Irritable bowel syndrome without diarrhea: Secondary | ICD-10-CM | POA: Insufficient documentation

## 2014-09-14 DIAGNOSIS — J4540 Moderate persistent asthma, uncomplicated: Secondary | ICD-10-CM | POA: Insufficient documentation

## 2014-09-14 DIAGNOSIS — G4733 Obstructive sleep apnea (adult) (pediatric): Secondary | ICD-10-CM | POA: Insufficient documentation

## 2014-09-14 DIAGNOSIS — R1032 Left lower quadrant pain: Secondary | ICD-10-CM | POA: Diagnosis not present

## 2014-09-14 DIAGNOSIS — Z91030 Bee allergy status: Secondary | ICD-10-CM | POA: Insufficient documentation

## 2014-09-14 DIAGNOSIS — Z62810 Personal history of physical and sexual abuse in childhood: Secondary | ICD-10-CM | POA: Insufficient documentation
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
## 2014-09-18 ENCOUNTER — Encounter: Payer: Self-pay | Admitting: General Surgery

## 2014-09-20 ENCOUNTER — Other Ambulatory Visit: Payer: Self-pay | Admitting: Pain Medicine

## 2014-09-20 LAB — PAP IG W/ RFLX HPV ASCU: PAP Smear Comment: 0

## 2014-09-25 ENCOUNTER — Encounter: Payer: Self-pay | Admitting: Pain Medicine

## 2014-09-25 ENCOUNTER — Ambulatory Visit: Payer: Medicare Other

## 2014-09-25 ENCOUNTER — Ambulatory Visit (INDEPENDENT_AMBULATORY_CARE_PROVIDER_SITE_OTHER): Payer: Medicare Other | Admitting: General Surgery

## 2014-09-25 ENCOUNTER — Encounter: Payer: Self-pay | Admitting: General Surgery

## 2014-09-25 ENCOUNTER — Ambulatory Visit: Payer: Medicare Other | Attending: Pain Medicine | Admitting: Pain Medicine

## 2014-09-25 VITALS — BP 133/92 | HR 79 | Temp 98.2°F | Resp 16 | Ht 67.5 in | Wt 193.0 lb

## 2014-09-25 VITALS — BP 130/76 | Ht 67.5 in | Wt 267.0 lb

## 2014-09-25 DIAGNOSIS — M47812 Spondylosis without myelopathy or radiculopathy, cervical region: Secondary | ICD-10-CM | POA: Diagnosis not present

## 2014-09-25 DIAGNOSIS — G43909 Migraine, unspecified, not intractable, without status migrainosus: Secondary | ICD-10-CM | POA: Insufficient documentation

## 2014-09-25 DIAGNOSIS — M5136 Other intervertebral disc degeneration, lumbar region: Secondary | ICD-10-CM | POA: Diagnosis not present

## 2014-09-25 DIAGNOSIS — M170 Bilateral primary osteoarthritis of knee: Secondary | ICD-10-CM

## 2014-09-25 DIAGNOSIS — R229 Localized swelling, mass and lump, unspecified: Secondary | ICD-10-CM

## 2014-09-25 DIAGNOSIS — M961 Postlaminectomy syndrome, not elsewhere classified: Secondary | ICD-10-CM

## 2014-09-25 DIAGNOSIS — M5126 Other intervertebral disc displacement, lumbar region: Secondary | ICD-10-CM | POA: Insufficient documentation

## 2014-09-25 DIAGNOSIS — M546 Pain in thoracic spine: Secondary | ICD-10-CM | POA: Diagnosis present

## 2014-09-25 DIAGNOSIS — M5134 Other intervertebral disc degeneration, thoracic region: Secondary | ICD-10-CM

## 2014-09-25 DIAGNOSIS — M533 Sacrococcygeal disorders, not elsewhere classified: Secondary | ICD-10-CM | POA: Diagnosis not present

## 2014-09-25 DIAGNOSIS — M5030 Other cervical disc degeneration, unspecified cervical region: Secondary | ICD-10-CM

## 2014-09-25 DIAGNOSIS — R51 Headache: Secondary | ICD-10-CM | POA: Diagnosis present

## 2014-09-25 DIAGNOSIS — M5481 Occipital neuralgia: Secondary | ICD-10-CM | POA: Insufficient documentation

## 2014-09-25 DIAGNOSIS — M47816 Spondylosis without myelopathy or radiculopathy, lumbar region: Secondary | ICD-10-CM | POA: Diagnosis not present

## 2014-09-25 DIAGNOSIS — M542 Cervicalgia: Secondary | ICD-10-CM | POA: Diagnosis present

## 2014-09-25 LAB — HPV DNA PROBE HIGH RISK, AMPLIFIED: HPV, high-risk: NEGATIVE

## 2014-09-25 LAB — SPECIMEN STATUS REPORT

## 2014-09-25 MED ORDER — TIZANIDINE HCL 4 MG PO CAPS: ORAL_CAPSULE | ORAL | Status: DC

## 2014-09-25 MED ORDER — OXYCODONE HCL 15 MG PO TABS: ORAL_TABLET | ORAL | Status: DC

## 2014-09-25 NOTE — Progress Notes (Signed)
   Subjective:    Patient ID: Peggye Form, female    DOB: [birth year], 54 y.o.   MRN: [id number]  HPI  Patient 54 year old female returns to [facility] for further evaluation and treatment of pain involving the neck headaches upper mid and lower back lower extremity region. At the present time patient was undergo neurological follow-up evaluation with there being plan to proceed with surgery for bladder tumor. Patient has had significant improvement of her pain with previous [facility]. Patient without significant pain involving the knees and without significant headache of pain of the lower back and lower extremity regions at this time. We will continue medications oxycodone and Zanaflex and will remain available to consider modifications of treatment pending follow-up neurological evaluation and surgical intervention and follow-up evaluation in [facility]. Patient is understanding and in agreement with suggested treatment plan.   Review of Systems     Objective:   Physical Exam mild tenderness of the splenius capitis and occipitalis region noted. No new lesions of the head and neck were noted. There was tennis over the region of the cervical facet cervical paraspinal musculature region and the thoracic facet thoracic paraspinal musculature region of mild degree. Patient appeared to be with slightly decreased grip strength. Tinel and Phalen's maneuver were without increase of pain of significant degree. Patient appeared to be with tenderness to palpation of the lower thoracic facet lower thoracic paraspinal musculature region of moderate degree. Palpation over the lumbar paraspinal muscular region lumbar facet region was with moderate discomfort. Extension and palpation of the lumbar facets reproduce moderate discomfort. There was moderate tinnitus of the PSIS and PII S region. Mild tinnitus of the greater trochanteric region iliotibial band  region. Knees were with mild tends to palpation crepitus of the knees were noted. There was negative anterior and posterior drawer signs with no ballottement of the patella noted no increased warmth or erythema of the knees noted. No definite sensory deficit of dermatomal distribution detected there was negative clonus negative Homans. Mild lower quadrant abdominal tenderness to palpation and suprapubic tenderness to palpation with no costovertebral angle tenderness noted..      Assessment & Plan:  Degenerative disc disease lumbar spine  Multilevel degenerative changes lumbar spine with L4-L5 degenerative changes and central disc protrusion    degenerative disc disease cervical spine multilevel degenerative changes of the cervical spine C5-6 with right-sided involvement more progressed with compression of the C5 nerve root   Lumbar facet syndrome   Cervical facet syndrome   Sacroiliac joint dysfunction  Bilateral occipital neuralgia  Migraine headache     Plan   Continue present medications oxycodone and Zanaflex  F/U PCP for evaliation of  BP and general medical  condition.  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  [facility] to call [facility] should patient have concerns prior to scheduled return appointment.

## 2014-09-25 NOTE — Progress Notes (Signed)
Discharged to home ambulatory with script in hand for oxycodone. Return in 1 month for eval.

## 2014-09-25 NOTE — Progress Notes (Signed)
Safety precautions to be maintained throughout the outpatient stay will include: orient to surroundings, keep bed in low position, maintain call bell within reach at all times, provide assistance with transfer out of bed and ambulation.  

## 2014-09-25 NOTE — Patient Instructions (Addendum)
Continue present medications Zanaflex and oxycodone  F/U PCP for evaliation of  BP and general medical  condition.  F/U surgical evaluation  F/U neurological evaluation  Urological evaluation as planned and is scheduled  May consider radiofrequency rhizolysis or intraspinal procedures pending response to present treatment and F/U evaluation.  Patient to call [facility] should patient have concerns prior to scheduled return appointment.

## 2014-09-25 NOTE — Progress Notes (Signed)
Patient ID: Jacqueline Campbell, female   DOB: 1960-06-09, 54 y.o.   MRN: [id number]  Chief Complaint  Patient presents with  . Abdominal Pain    HPI Jacqueline Campbell is a 54 y.o. female.  Here today for evaluation of left abdominal pain. She states she has had the pain for over a year but worse over the past 6 months, She describes it as a sharp knife lasting 2-3 minutes, occurring at random. Not associated with any foods. She states she can feel a knot there as well.    HPI  Past Medical History  Diagnosis Date  . Asthma   . Glaucoma   . GERD (gastroesophageal reflux disease)   . Cancer 1992    cervical  . Ovarian cancer 1994  . Throat cancer 1998    Past Surgical History  Procedure Laterality Date  . Tubal ligation  1989  . Abdominal hysterectomy  1992  . Tonsillectomy and adenoidectomy  1994  . Foot surgery Bilateral 1996  . Knee surgery Left     age 53  . Eye surgery Bilateral 2006, 2008, 2010  . Bladder surgery      multiple  . Breast biopsy Right     Family History  Problem Relation Age of Onset  . Breast cancer Mother     eye and ovarian  . Hypertension Mother   . Diabetes Mother   . Heart disease Mother   . COPD Mother   . Prostate cancer Brother   . Cancer Father     Colon and Prostate  . Leukemia Son     Social History History  Substance Use Topics  . Smoking status: Never Smoker   . Smokeless tobacco: Never Used  . Alcohol Use: No    Allergies  Allergen Reactions  . Red Dye Anaphylaxis  . Abilify [Aripiprazole]   . Amitiza [Lubiprostone]   . Benadryl [Diphenhydramine Hcl]   . Diphenhydramine-Zinc Acetate Hives  . Gabapentin   . Iohexol   . Lac Bovis Swelling  . Risperidone And Related   . Strawberry Swelling  . Aspirin Rash  . Ciprofloxacin Rash  . Enablex [Darifenacin] Rash  . Iodides Rash  . Iodine Rash  . Latex Rash  . Levaquin [Levofloxacin In D5w] Rash  . Levofloxacin Rash  . Penicillins Rash  . Risperidone Rash  . Sulfa  Antibiotics Rash    Current Outpatient Prescriptions  Medication Sig Dispense Refill  . acyclovir ointment (ZOVIRAX) 5 % Apply 1 application topically 5 (five) times daily as needed.    Marland Kitchen albuterol (PROVENTIL) (2.5 MG/3ML) 0.083% nebulizer solution Take 2.5 mg by nebulization every 6 (six) hours as needed for wheezing.    Marland Kitchen amLODipine-olmesartan (AZOR) 10-40 MG per tablet Take 1 tablet by mouth daily.    Marland Kitchen azithromycin (ZITHROMAX) 250 MG tablet Take 2 tabs by mouth on day 1 Take 1 tab by mouth  on the following 4 days 6 each 0  . clindamycin (CLEOCIN) 150 MG capsule Take by mouth 3 (three) times daily.    . cloNIDine (CATAPRES - DOSED IN MG/24 HR) 0.1 mg/24hr patch APPLY 1 PATCH ONCE WEEKLY AS DIRECTED (TO REPLACE THE 0.2MG  PATCH) 30 patch 2  . cloNIDine (CATAPRES) 0.2 MG tablet Take 0.2 mg by mouth 2 (two) times daily.    . diazepam (VALIUM) 5 MG tablet Take 5 mg by mouth every 6 (six) hours as needed for anxiety.    . dorzolamide (TRUSOPT) 2 % ophthalmic solution Place 1  drop into both eyes 3 (three) times daily.    Marland Kitchen EPINEPHRINE IJ Inject as directed as needed. Epi pen    . esomeprazole (NEXIUM) 40 MG capsule Take 40 mg by mouth daily before breakfast.    . Fluticasone-Salmeterol (ADVAIR) 500-50 MCG/DOSE AEPB Inhale 1 puff into the lungs every 12 (twelve) hours.    . furosemide (LASIX) 20 MG tablet Take 20 mg by mouth daily.    Marland Kitchen ibuprofen (ADVIL,MOTRIN) 400 MG tablet Take 1 tablet by mouth daily.    Marland Kitchen levocetirizine (XYZAL) 5 MG tablet Take 5 mg by mouth every evening.    . lidocaine (LIDODERM) 5 % Place 1 patch onto the skin every 12 (twelve) hours as needed.    . loratadine (CLARITIN) 10 MG tablet Take 10 mg by mouth daily.    . meloxicam (MOBIC) 7.5 MG tablet Take 7.5 mg by mouth 2 (two) times daily.    . mometasone (ELOCON) 0.1 % cream Apply topically daily.    . mometasone (NASONEX) 50 MCG/ACT nasal spray Place 2 sprays into the nose daily.    . montelukast (SINGULAIR) 10 MG tablet  Take 10 mg by mouth at bedtime.    . nadolol (CORGARD) 40 MG tablet Take 40 mg by mouth daily.    . nebivolol (BYSTOLIC) 10 MG tablet Take 10 mg by mouth daily.    Marland Kitchen olmesartan-hydrochlorothiazide (BENICAR HCT) 40-25 MG per tablet Take 1 tablet by mouth daily.    Marland Kitchen oxyCODONE (ROXICODONE) 15 MG immediate release tablet Limit 1 tab by mouth bib - qid  times a day if tolerated 120 tablet 0  . pentosan polysulfate (ELMIRON) 100 MG capsule Take 100 mg by mouth 3 (three) times daily before meals.    . pentosan polysulfate (ELMIRON) 100 MG capsule Take 100 mg by mouth daily. 3 tablets    . Potassium Chloride Crys CR (KLOR-CON M20 PO) Take 1 tablet by mouth daily.    . promethazine (PHENERGAN) 25 MG tablet Take 25 mg by mouth every 6 (six) hours as needed for nausea.    . Sodium Fluoride (DENTA 5000 PLUS DT) Place onto teeth 3 (three) times daily.    . sodium fluoride (LURIDE) 1.1 (0.5 F) MG/ML SOLN Take 1 drop by mouth 3 (three) times daily.    Marland Kitchen tiZANidine (ZANAFLEX) 4 MG capsule Limited 3-5 tabs by mouth per day if tolerated 150 capsule 0  . valACYclovir (VALTREX) 500 MG tablet Take 500 mg by mouth 1 day or 1 dose.    . zolpidem (AMBIEN) 10 MG tablet Take 10 mg by mouth at bedtime as needed for sleep.    Marland Kitchen zolpidem (AMBIEN) 5 MG tablet Take 5 mg by mouth at bedtime as needed for sleep.     No current facility-administered medications for this visit.    Review of Systems Review of Systems  Constitutional: Negative.   Respiratory: Negative.   Cardiovascular: Negative.   Gastrointestinal: Positive for nausea and abdominal pain. Negative for vomiting, diarrhea, constipation and blood in stool.    Blood pressure 130/76, height 5' 7.5" (1.715 m), weight 267 lb (121.11 kg).  Physical Exam Physical Exam  Constitutional: She is oriented to person, place, and time. She appears well-developed and well-nourished.  HENT:  Mouth/Throat: Oropharynx is clear and moist.  Eyes: Conjunctivae are normal. No  scleral icterus.  Neck: Neck supple.  Cardiovascular: Normal rate, regular rhythm and normal heart sounds.   Pulmonary/Chest: Effort normal and breath sounds normal.  Abdominal: Soft. Normal appearance.  There is tenderness.  5 mm firm tender spot subcutaneus left abdomen  9 CF umbilicus.   Lymphadenopathy:    She has no cervical adenopathy.  Neurological: She is alert and oriented to person, place, and time.  Skin: Skin is warm and dry.  Psychiatric: She has a normal mood and affect.    Data Reviewed Progress notes. Targeted US of laft abd wall mass shows no findings Assessment    Subcutaneous mass abdominal wall. Tender and symptomatic. Possible fat necrosis    Plan    Recommend excision of subcutaneous mass versus no intervention. Pt would prefer to have it excised.      PCP:  Terance Hart 09/25/2014, 10:26 AM

## 2014-09-26 ENCOUNTER — Telehealth: Payer: Self-pay | Admitting: Gastroenterology

## 2014-09-26 NOTE — Telephone Encounter (Signed)
Phoned pt for colon triage, Pt states she has some medical procedures coming up and will call back when ready to schedule

## 2014-09-26 NOTE — Progress Notes (Signed)
Patient notified

## 2014-10-02 ENCOUNTER — Encounter: Payer: Self-pay | Admitting: Family Medicine

## 2014-10-02 ENCOUNTER — Ambulatory Visit (INDEPENDENT_AMBULATORY_CARE_PROVIDER_SITE_OTHER): Payer: Medicare Other | Admitting: Family Medicine

## 2014-10-02 VITALS — BP 118/82 | HR 86 | Temp 98.4°F | Resp 18 | Ht 66.0 in | Wt 269.6 lb

## 2014-10-02 DIAGNOSIS — G4733 Obstructive sleep apnea (adult) (pediatric): Secondary | ICD-10-CM

## 2014-10-02 DIAGNOSIS — I10 Essential (primary) hypertension: Secondary | ICD-10-CM

## 2014-10-02 DIAGNOSIS — A6009 Herpesviral infection of other urogenital tract: Secondary | ICD-10-CM | POA: Insufficient documentation

## 2014-10-02 DIAGNOSIS — A609 Anogenital herpesviral infection, unspecified: Secondary | ICD-10-CM

## 2014-10-02 DIAGNOSIS — G4700 Insomnia, unspecified: Secondary | ICD-10-CM | POA: Diagnosis not present

## 2014-10-02 DIAGNOSIS — Z9989 Dependence on other enabling machines and devices: Secondary | ICD-10-CM

## 2014-10-02 DIAGNOSIS — K219 Gastro-esophageal reflux disease without esophagitis: Secondary | ICD-10-CM | POA: Diagnosis not present

## 2014-10-02 DIAGNOSIS — J3089 Other allergic rhinitis: Secondary | ICD-10-CM

## 2014-10-02 DIAGNOSIS — J309 Allergic rhinitis, unspecified: Secondary | ICD-10-CM | POA: Diagnosis not present

## 2014-10-02 DIAGNOSIS — N3010 Interstitial cystitis (chronic) without hematuria: Secondary | ICD-10-CM | POA: Diagnosis not present

## 2014-10-02 DIAGNOSIS — F411 Generalized anxiety disorder: Secondary | ICD-10-CM | POA: Diagnosis not present

## 2014-10-02 DIAGNOSIS — J4540 Moderate persistent asthma, uncomplicated: Secondary | ICD-10-CM | POA: Diagnosis not present

## 2014-10-02 DIAGNOSIS — R110 Nausea: Secondary | ICD-10-CM

## 2014-10-02 MED ORDER — ZOLPIDEM TARTRATE 5 MG PO TABS: 5.0000 mg | ORAL_TABLET | Freq: Every evening | ORAL | Status: DC | PRN

## 2014-10-02 MED ORDER — DIAZEPAM 5 MG PO TABS: 5.0000 mg | ORAL_TABLET | Freq: Four times a day (QID) | ORAL | Status: DC | PRN

## 2014-10-02 MED ORDER — VALACYCLOVIR HCL 500 MG PO TABS: 500.0000 mg | ORAL_TABLET | ORAL | Status: DC

## 2014-10-02 MED ORDER — PENTOSAN POLYSULFATE SODIUM 100 MG PO CAPS: 100.0000 mg | ORAL_CAPSULE | Freq: Three times a day (TID) | ORAL | Status: DC

## 2014-10-02 MED ORDER — TRAZODONE HCL 50 MG PO TABS: 50.0000 mg | ORAL_TABLET | Freq: Every day | ORAL | Status: DC
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
## 2014-10-03 ENCOUNTER — Ambulatory Visit: Payer: Medicare Other | Admitting: General Surgery

## 2014-10-06 ENCOUNTER — Other Ambulatory Visit: Payer: Self-pay | Admitting: Family Medicine

## 2014-10-25 ENCOUNTER — Encounter: Payer: Self-pay | Admitting: Pain Medicine

## 2014-10-25 ENCOUNTER — Ambulatory Visit: Payer: Medicare Other | Attending: Pain Medicine | Admitting: Pain Medicine

## 2014-10-25 VITALS — BP 127/79 | HR 87 | Temp 98.3°F | Resp 16 | Ht 67.0 in | Wt 223.0 lb

## 2014-10-25 DIAGNOSIS — M79602 Pain in left arm: Secondary | ICD-10-CM | POA: Diagnosis present

## 2014-10-25 DIAGNOSIS — M79601 Pain in right arm: Secondary | ICD-10-CM | POA: Diagnosis present

## 2014-10-25 DIAGNOSIS — M5030 Other cervical disc degeneration, unspecified cervical region: Secondary | ICD-10-CM | POA: Insufficient documentation

## 2014-10-25 DIAGNOSIS — M170 Bilateral primary osteoarthritis of knee: Secondary | ICD-10-CM

## 2014-10-25 DIAGNOSIS — M533 Sacrococcygeal disorders, not elsewhere classified: Secondary | ICD-10-CM

## 2014-10-25 DIAGNOSIS — M47816 Spondylosis without myelopathy or radiculopathy, lumbar region: Secondary | ICD-10-CM | POA: Diagnosis not present

## 2014-10-25 DIAGNOSIS — M5136 Other intervertebral disc degeneration, lumbar region: Secondary | ICD-10-CM | POA: Insufficient documentation

## 2014-10-25 DIAGNOSIS — M961 Postlaminectomy syndrome, not elsewhere classified: Secondary | ICD-10-CM

## 2014-10-25 DIAGNOSIS — M542 Cervicalgia: Secondary | ICD-10-CM | POA: Diagnosis present

## 2014-10-25 DIAGNOSIS — M5134 Other intervertebral disc degeneration, thoracic region: Secondary | ICD-10-CM

## 2014-10-25 MED ORDER — OXYCODONE HCL 15 MG PO TABS: ORAL_TABLET | ORAL | Status: DC

## 2014-10-25 MED ORDER — TIZANIDINE HCL 4 MG PO TABS: ORAL_TABLET | ORAL | Status: DC

## 2014-10-25 NOTE — Progress Notes (Signed)
   Subjective:    Patient ID: Jacqueline Campbell, female    DOB: Nov 26, 1960, 54 y.o.   MRN: [id number]  HPI    Review of Systems     Objective:   Physical Exam        Assessment & Plan:

## 2014-10-25 NOTE — Patient Instructions (Signed)
Continue present medications Zanaflex and oxycodone  F/U PCP Dr Ancil Boozer  for evaliation of  BP and general medical  condition  F/U surgical evaluation as discussed and as planned  F/U neurological evaluation  May consider radiofrequency rhizolysis or intraspinal procedures pending response to present treatment and F/U evaluation   Patient to call [facility] should patient have concerns prior to scheduled return appointmen.

## 2014-10-25 NOTE — Progress Notes (Signed)
   Subjective:    Patient ID: Jacqueline Campbell, female    DOB: 23-Jun-1960, 54 y.o.   MRN: [id number]  HPI  Patient 54 year old female returns to [facility] for further evaluation and treatment of pain involving the neck upper extremity regions headache upper mid and lower back and lower extremity regions. At the present time patient is scheduled to undergo surgical evaluation of the abdominal region. Patient states there is concern regarding the mass the left abdominal region. Patient states that pain is been fairly well-controlled with present treatment regimen. We will avoid interventional treatment and will continue medications as prescribed. Patient will undergo surgical evaluation with plans for surgical intervention of the abdominal region and we will remain available to consider modifications of treatment as discussed and as explained to patient on today's visit.    Review of Systems     Objective:   Physical Exam  There was tenderness of the splenius capitis and occipitalis musculature region of mild degree. Mild tennis over the region of the cervical and thoracic facet and paraspinal musculature regions. Appeared to be unremarkable Spurling's maneuver. There was tenderness over the region of the acromioclavicular and glenohumeral joint regions of minimal degree. Palpation over the thoracic facet thoracic paraspinal musculature region lower thoracic region was with moderate tends to palpation. Tinel and Phalen's maneuver were without increase of pain of significant degree. Straight leg raising was limited to approximately 20. There was questionable decreased sensation of the L5 dermatomal distribution. There was negative clonus negative Homans. Patient had difficulty attempt attempted to stand on tiptoes and heels. There was tenderness over the PSIS and PII S region of moderate degree. The abdomen was nontender and no costovertebral angle tenderness was noted. Clonus negative  Homans. There was mild to moderate tenderness over the greater trochanteric region and iliotibial band region.   Abdomen was a slight tends to palpation on the left side compared to the right. There was no fluid wave or shifting dullness of the abdominal region noted.    Assessment   Degenerative disc disease lumbar spine  Multilevel degenerative changes lumbar spine with L4-L5 degenerative changes and central disc protrusion    degenerative disc disease cervical spine multilevel degenerative changes of the cervical spine C5-6 with right-sided involvement more progressed with compression of the C5 nerve root   Lumbar facet syndrome   Cervical facet syndrome     Plan   Continue present medications Zanaflex and oxycodone  F/U PCP Dr. Ancil Boozer  for evaliation of  BP and general medical  condition  F/U surgical evaluation  . Patient is being considered for surgical intervention of the abdominal region at this time  F/U neurological evaluation  May consider radiofrequency rhizolysis or intraspinal procedures pending response to present treatment and F/U evaluation   Patient to call [facility] should patient have concerns prior to scheduled return appointmen.

## 2014-10-25 NOTE — Progress Notes (Signed)
Safety precautions to be maintained throughout the outpatient stay will include: orient to surroundings, keep bed in low position, maintain call bell within reach at all times, provide assistance with transfer out of bed and ambulation.  

## 2014-11-02 ENCOUNTER — Ambulatory Visit (INDEPENDENT_AMBULATORY_CARE_PROVIDER_SITE_OTHER): Payer: Medicare Other | Admitting: Family Medicine

## 2014-11-02 ENCOUNTER — Encounter: Payer: Self-pay | Admitting: Family Medicine

## 2014-11-02 VITALS — BP 124/78 | HR 97 | Temp 98.2°F | Resp 16 | Ht 67.0 in | Wt 275.6 lb

## 2014-11-02 DIAGNOSIS — H9201 Otalgia, right ear: Secondary | ICD-10-CM

## 2014-11-02 DIAGNOSIS — Z23 Encounter for immunization: Secondary | ICD-10-CM | POA: Diagnosis not present

## 2014-11-02 DIAGNOSIS — M7989 Other specified soft tissue disorders: Secondary | ICD-10-CM | POA: Diagnosis not present

## 2014-11-02 DIAGNOSIS — G4700 Insomnia, unspecified: Secondary | ICD-10-CM | POA: Diagnosis not present

## 2014-11-02 MED ORDER — TRAZODONE HCL 50 MG PO TABS: 25.0000 mg | ORAL_TABLET | Freq: Every evening | ORAL | Status: DC | PRN
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
## 2014-11-05 ENCOUNTER — Other Ambulatory Visit: Payer: Self-pay | Admitting: Family Medicine

## 2014-11-21 ENCOUNTER — Encounter: Payer: Self-pay | Admitting: Pain Medicine

## 2014-11-21 ENCOUNTER — Ambulatory Visit: Payer: Medicare Other | Attending: Pain Medicine | Admitting: Pain Medicine

## 2014-11-21 VITALS — BP 122/79 | HR 85 | Temp 98.4°F | Resp 18 | Ht 67.5 in | Wt 223.0 lb

## 2014-11-21 DIAGNOSIS — M170 Bilateral primary osteoarthritis of knee: Secondary | ICD-10-CM | POA: Insufficient documentation

## 2014-11-21 DIAGNOSIS — M533 Sacrococcygeal disorders, not elsewhere classified: Secondary | ICD-10-CM

## 2014-11-21 DIAGNOSIS — M5030 Other cervical disc degeneration, unspecified cervical region: Secondary | ICD-10-CM | POA: Insufficient documentation

## 2014-11-21 DIAGNOSIS — M546 Pain in thoracic spine: Secondary | ICD-10-CM | POA: Diagnosis present

## 2014-11-21 DIAGNOSIS — M542 Cervicalgia: Secondary | ICD-10-CM | POA: Diagnosis present

## 2014-11-21 DIAGNOSIS — M961 Postlaminectomy syndrome, not elsewhere classified: Secondary | ICD-10-CM

## 2014-11-21 DIAGNOSIS — M5481 Occipital neuralgia: Secondary | ICD-10-CM | POA: Insufficient documentation

## 2014-11-21 DIAGNOSIS — M47816 Spondylosis without myelopathy or radiculopathy, lumbar region: Secondary | ICD-10-CM | POA: Insufficient documentation

## 2014-11-21 DIAGNOSIS — M5126 Other intervertebral disc displacement, lumbar region: Secondary | ICD-10-CM | POA: Diagnosis not present

## 2014-11-21 DIAGNOSIS — M5136 Other intervertebral disc degeneration, lumbar region: Secondary | ICD-10-CM

## 2014-11-21 DIAGNOSIS — M5134 Other intervertebral disc degeneration, thoracic region: Secondary | ICD-10-CM

## 2014-11-21 MED ORDER — TIZANIDINE HCL 4 MG PO TABS: ORAL_TABLET | ORAL | Status: DC

## 2014-11-21 MED ORDER — OXYCODONE HCL 15 MG PO TABS: ORAL_TABLET | ORAL | Status: DC

## 2014-11-21 NOTE — Progress Notes (Signed)
Safety precautions to be maintained throughout the outpatient stay will include: orient to surroundings, keep bed in low position, maintain call bell within reach at all times, provide assistance with transfer out of bed and ambulation.  Flu shot 11/02/2014

## 2014-11-21 NOTE — Progress Notes (Signed)
   Subjective:    Patient ID: Jacqueline Campbell, female    DOB: [birth year], 54 y.o.   MRN: [id number]  HPI  Patient is 54 year old female returns to [facility] for further evaluation and treatment of pain involving the neck and entire back upper and lower extremity regions. Patient states that she has rather severe pain involving the knees at this time. Patient without definite trauma change in events of daily living the call significant change in symptomatology. Patient is with known degenerative joint disease of the knees. We will proceed with geniculate nerve blocks of the knees at time return appointment in attempt to decrease severity of patient's pain minimize progression of symptoms and avoid the need for more involved treatment. The patient is in agreement with suggested treatment plan. We will continue Zanaflex and oxycodone as prescribed this time.   Review of Systems     Objective:   Physical Exam  There was tenderness of the splenius capitis and occipitalis musculature region of mild to moderate degree. No new masses of the head and neck were noted. There was tenderness of the cervical facet cervical paraspinal musculature region of mild degree. There was mild tennis over the thoracic facet thoracic paraspinal musculature region. Patient was with unremarkable Spurling's maneuver. Palpation over the region of the thoracic facet thoracic paraspinal musculature region reproduced mild discomfort. Palpation over the lumbar paraspinal muscles lumbar facet region reproduced mild to moderate discomfort. Lateral bending rotation and extension and palpation over the lumbar facets reproduce mild to moderate discomfort. With mild tennis over the PSIS and PII S regions. There was mild tenderness of the greater trochanteric region and iliotibial band region. Knees were with without increased warmth or erythema. There was no ballottement of the patella. There was severe pain with range of motion  of the knee was significant crepitus of the knee noted. There was negative anterior and posterior drawer signs. EHL strength appeared to be decreased of definite sensory deficit of dermatomal distribution detected negative clonus negative Homans. Abdomen with mild tends to palpation and no costovertebral maintenance noted.       Assessment & Plan:   Degenerative disc disease lumbar spine Degenerative changes of the lumbar spine with L4-L5 degenerative changes and central disc protrusion  Degenerative joint disease of knees  Degenerative disc disease of the cervical spine C5-6 right-sided involvement most progressed with compression of the C5 nerve root  Sacroiliac joint dysfunction  Lumbar facet syndrome  Greater occipital neuralgia    PLAN   Continue present medication Zanaflex and oxycodone  Geniculate nerve blocks of the knee to be performed at time return appointment  F/U PCP Dr. Ancil Boozer  for evaliation of  BP and general medical  condition  F/U surgical evaluation. May consider further surgical evaluation of knees pending follow-up evaluation  F/U neurological evaluation. May consider pending follow-up evaluations  F/U with Dr.Sankar as planned  May consider radiofrequency rhizolysis or intraspinal procedures pending response to present treatment and F/U evaluation   Patient to call [facility] should patient have concerns prior to scheduled return appointment.

## 2014-11-21 NOTE — Patient Instructions (Addendum)
PLAN   Continue present medication Zanaflex and oxycodone  Geniculate nerve blocks of the knee to be performed at time of return appointment  F/U PCP Dr.  Ancil Boozer for evaliation of  BP and general medical  condition  F/U surgical evaluation as planned  F/U neurological evaluation. May consider pending follow-up evaluations  May consider radiofrequency rhizolysis or intraspinal procedures pending response to present treatment and F/U evaluation   Patient to call [facility] should patient have concerns prior to scheduled return appointment.   Knee Injection Joint injections are shots. Your caregiver will place a needle into your knee joint. The needle is used to put medicine into the joint. These shots can be used to help treat different painful knee conditions such as osteoarthritis, bursitis, local flare-ups of rheumatoid arthritis, and pseudogout. Anti-inflammatory medicines such as corticosteroids and anesthetics are the most common medicines used for joint and soft tissue injections.  PROCEDURE  The skin over the kneecap will be cleaned with an antiseptic solution.  Your caregiver will inject a small amount of a local anesthetic (a medicine like Novocaine) just under the skin in the area that was cleaned.  After the area becomes numb, a second injection is done. This second injection usually includes an anesthetic and an anti-inflammatory medicine called a steroid or cortisone. The needle is carefully placed in between the kneecap and the knee, and the medicine is injected into the joint space.  After the injection is done, the needle is removed. Your caregiver may place a bandage over the injection site. The whole procedure takes no more than a couple of minutes. BEFORE THE PROCEDURE  Wash all of the skin around the entire knee area. Try to remove any loose, scaling skin. There is no other specific preparation necessary unless advised otherwise by your caregiver. LET  YOUR CAREGIVER KNOW ABOUT:   Allergies.  Medications taken including herbs, eye drops, over the counter medications, and creams.  Use of steroids (by mouth or creams).  Possible pregnancy, if applicable.  Previous problems with anesthetics or Novocaine.  History of blood clots (thrombophlebitis).  History of bleeding or blood problems.  Previous surgery.  Other health problems. RISKS AND COMPLICATIONS Side effects from cortisone shots are rare. They include:   Slight bruising of the skin.  Shrinkage of the normal fatty tissue under the skin where the shot was given.  Increase in pain after the shot.  Infection.  Weakening of tendons or tendon rupture.  Allergic reaction to the medicine.  Diabetics may have a temporary increase in their blood sugar after a shot.  Cortisone can temporarily weaken the immune system. While receiving these shots, you should not get certain vaccines. Also, avoid contact with anyone who has chickenpox or measles. Especially if you have never had these diseases or have not been previously immunized. Your immune system may not be strong enough to fight off the infection while the cortisone is in your system. AFTER THE PROCEDURE   You can go home after the procedure.  You may need to put ice on the joint 15-20 minutes every 3 or 4 hours until the pain goes away.  You may need to put an elastic bandage on the joint. HOME CARE INSTRUCTIONS   Only take over-the-counter or prescription medicines for pain, discomfort, or fever as directed by your caregiver.  You should avoid stressing the joint. Unless advised otherwise, avoid activities that put a lot of pressure on a knee joint, such as:  Jogging.  Bicycling.  Recreational climbing.  Hiking.  Laying down and elevating the leg/knee above the level of your heart can help to minimize swelling. SEEK MEDICAL CARE IF:   You have repeated or worsening swelling.  There is drainage from the  puncture area.  You develop red streaking that extends above or below the site where the needle was inserted. SEEK IMMEDIATE MEDICAL CARE IF:   You develop a fever.  You have pain that gets worse even though you are taking pain medicine.  The area is red and warm, and you have trouble moving the joint. MAKE SURE YOU:   Understand these instructions.  Will watch your condition.  Will get help right away if you are not doing well or get worse. Document Released: 05/24/2006 Document Revised: 05/25/2011 Document Reviewed: 02/18/2007 [facility] Patient Information 2015 [place], [place]. This information is not intended to replace advice given to you by your health care provider. Make sure you discuss any questions you have with your health care provider.  A prescription for ZANAFLEX was sent to your pharmacy and should be available for pickup today. A prescription for OXYCODONE was given to you today.

## 2014-11-28 ENCOUNTER — Ambulatory Visit: Payer: Medicare Other | Admitting: Pain Medicine

## 2014-12-03 ENCOUNTER — Ambulatory Visit (INDEPENDENT_AMBULATORY_CARE_PROVIDER_SITE_OTHER): Payer: Medicare Other | Admitting: Family Medicine

## 2014-12-03 ENCOUNTER — Encounter: Payer: Self-pay | Admitting: Family Medicine

## 2014-12-03 VITALS — BP 124/88 | HR 88 | Temp 98.2°F | Resp 14 | Ht 68.0 in | Wt 279.0 lb

## 2014-12-03 DIAGNOSIS — R1033 Periumbilical pain: Secondary | ICD-10-CM

## 2014-12-03 DIAGNOSIS — G4700 Insomnia, unspecified: Secondary | ICD-10-CM

## 2014-12-03 DIAGNOSIS — M7989 Other specified soft tissue disorders: Secondary | ICD-10-CM

## 2014-12-03 DIAGNOSIS — S81802A Unspecified open wound, left lower leg, initial encounter: Secondary | ICD-10-CM | POA: Diagnosis not present

## 2014-12-03 MED ORDER — CLINDAMYCIN HCL 300 MG PO CAPS: 300.0000 mg | ORAL_CAPSULE | Freq: Three times a day (TID) | ORAL | Status: DC
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
## 2014-12-04 ENCOUNTER — Other Ambulatory Visit: Payer: Self-pay

## 2014-12-04 ENCOUNTER — Other Ambulatory Visit: Payer: Self-pay | Admitting: Pain Medicine

## 2014-12-04 DIAGNOSIS — R1033 Periumbilical pain: Secondary | ICD-10-CM

## 2014-12-04 DIAGNOSIS — R635 Abnormal weight gain: Secondary | ICD-10-CM

## 2014-12-04 DIAGNOSIS — R609 Edema, unspecified: Secondary | ICD-10-CM

## 2014-12-04 DIAGNOSIS — R1084 Generalized abdominal pain: Secondary | ICD-10-CM

## 2014-12-04 LAB — CBC WITH DIFFERENTIAL/PLATELET
Basophils Absolute: 0 10*3/uL (ref 0.0–0.2)
Basos: 0 %
EOS (ABSOLUTE): 0.3 10*3/uL (ref 0.0–0.4)
EOS: 4 %
HEMATOCRIT: 40.3 % (ref 34.0–46.6)
Hemoglobin: 13.1 g/dL (ref 11.1–15.9)
Immature Grans (Abs): 0 10*3/uL (ref 0.0–0.1)
Immature Granulocytes: 0 %
LYMPHS ABS: 2.2 10*3/uL (ref 0.7–3.1)
Lymphs: 23 %
MCH: 29.2 pg (ref 26.6–33.0)
MCHC: 32.5 g/dL (ref 31.5–35.7)
MCV: 90 fL (ref 79–97)
MONOS ABS: 0.8 10*3/uL (ref 0.1–0.9)
Monocytes: 9 %
NEUTROS ABS: 6.1 10*3/uL (ref 1.4–7.0)
Neutrophils: 64 %
Platelets: 292 10*3/uL (ref 150–379)
RBC: 4.48 x10E6/uL (ref 3.77–5.28)
RDW: 13.7 % (ref 12.3–15.4)
WBC: 9.6 10*3/uL (ref 3.4–10.8)

## 2014-12-04 LAB — COMPREHENSIVE METABOLIC PANEL
ALBUMIN: 4.4 g/dL (ref 3.5–5.5)
ALK PHOS: 89 IU/L (ref 39–117)
ALT: 19 IU/L (ref 0–32)
AST: 20 IU/L (ref 0–40)
Albumin/Globulin Ratio: 1.6 (ref 1.1–2.5)
BUN / CREAT RATIO: 16 (ref 9–23)
BUN: 12 mg/dL (ref 6–24)
Bilirubin Total: 0.3 mg/dL (ref 0.0–1.2)
CHLORIDE: 98 mmol/L (ref 97–108)
CO2: 27 mmol/L (ref 18–29)
Calcium: 9.5 mg/dL (ref 8.7–10.2)
Creatinine, Ser: 0.77 mg/dL (ref 0.57–1.00)
GFR calc Af Amer: 103 mL/min/{1.73_m2} (ref >59)
GFR calc non Af Amer: 89 mL/min/{1.73_m2} (ref >59)
GLOBULIN, TOTAL: 2.8 g/dL (ref 1.5–4.5)
Glucose: 88 mg/dL (ref 65–99)
POTASSIUM: 4.6 mmol/L (ref 3.5–5.2)
SODIUM: 140 mmol/L (ref 134–144)
Total Protein: 7.2 g/dL (ref 6.0–8.5)

## 2014-12-04 LAB — C-REACTIVE PROTEIN: CRP: 3.5 mg/L (ref 0.0–4.9)

## 2014-12-04 LAB — THYROID PANEL WITH TSH
Free Thyroxine Index: 2.9 (ref 1.2–4.9)
T3 Uptake Ratio: 22 % — ABNORMAL LOW (ref 24–39)
T4, Total: 13.3 ug/dL — ABNORMAL HIGH (ref 4.5–12.0)
TSH: 0.991 u[IU]/mL (ref 0.450–4.500)

## 2014-12-04 LAB — LIPASE: Lipase: 27 U/L (ref 0–59)

## 2014-12-04 NOTE — Progress Notes (Signed)
Patient notified

## 2014-12-05 ENCOUNTER — Ambulatory Visit: Admission: RE | Admit: 2014-12-05 | Payer: Medicare Other | Source: Ambulatory Visit

## 2014-12-05 ENCOUNTER — Other Ambulatory Visit: Payer: Self-pay

## 2014-12-05 ENCOUNTER — Telehealth: Payer: Self-pay

## 2014-12-05 DIAGNOSIS — G4700 Insomnia, unspecified: Secondary | ICD-10-CM

## 2014-12-05 DIAGNOSIS — R1084 Generalized abdominal pain: Secondary | ICD-10-CM

## 2014-12-05 DIAGNOSIS — R1033 Periumbilical pain: Secondary | ICD-10-CM

## 2014-12-05 DIAGNOSIS — R635 Abnormal weight gain: Secondary | ICD-10-CM

## 2014-12-05 LAB — WOUND CULTURE

## 2014-12-05 MED ORDER — CLONIDINE HCL 0.1 MG/24HR TD PTWK: 0.1000 mg | MEDICATED_PATCH | TRANSDERMAL | Status: DC

## 2014-12-05 MED ORDER — PROMETHAZINE HCL 25 MG PO TABS: 25.0000 mg | ORAL_TABLET | Freq: Four times a day (QID) | ORAL | Status: DC | PRN

## 2014-12-05 MED ORDER — TRAZODONE HCL 50 MG PO TABS: 25.0000 mg | ORAL_TABLET | Freq: Every evening | ORAL | Status: DC | PRN

## 2014-12-05 NOTE — Telephone Encounter (Signed)
Colletta Maryland from [facility] called about CT scheduled stating that pt is allergic to contrast and would need new order. Call back number [phone]

## 2014-12-05 NOTE — Telephone Encounter (Signed)
Patient requesting refill. 

## 2014-12-06 ENCOUNTER — Ambulatory Visit
Admission: RE | Admit: 2014-12-06 | Discharge: 2014-12-06 | Disposition: A | Discharge status: Home / Self Care | Payer: Medicare Other | Source: Ambulatory Visit | Attending: Family Medicine | Admitting: Family Medicine

## 2014-12-06 ENCOUNTER — Ambulatory Visit: Admission: RE | Admit: 2014-12-06 | Payer: Medicare Other | Source: Ambulatory Visit

## 2014-12-06 DIAGNOSIS — R609 Edema, unspecified: Secondary | ICD-10-CM

## 2014-12-06 DIAGNOSIS — R222 Localized swelling, mass and lump, trunk: Secondary | ICD-10-CM | POA: Diagnosis not present

## 2014-12-06 DIAGNOSIS — R1084 Generalized abdominal pain: Secondary | ICD-10-CM | POA: Insufficient documentation

## 2014-12-06 DIAGNOSIS — R635 Abnormal weight gain: Secondary | ICD-10-CM

## 2014-12-06 DIAGNOSIS — R1033 Periumbilical pain: Secondary | ICD-10-CM

## 2014-12-06 NOTE — Progress Notes (Signed)
Pt.notified

## 2014-12-07 ENCOUNTER — Encounter: Payer: Self-pay | Admitting: Family Medicine

## 2014-12-07 ENCOUNTER — Ambulatory Visit (INDEPENDENT_AMBULATORY_CARE_PROVIDER_SITE_OTHER): Payer: Medicare Other | Admitting: Family Medicine

## 2014-12-07 VITALS — BP 136/84 | HR 107 | Temp 98.8°F | Resp 16 | Ht 68.0 in | Wt 274.0 lb

## 2014-12-07 DIAGNOSIS — M7989 Other specified soft tissue disorders: Secondary | ICD-10-CM

## 2014-12-07 DIAGNOSIS — D171 Benign lipomatous neoplasm of skin and subcutaneous tissue of trunk: Secondary | ICD-10-CM | POA: Diagnosis not present

## 2014-12-07 DIAGNOSIS — L97921 Non-pressure chronic ulcer of unspecified part of left lower leg limited to breakdown of skin: Secondary | ICD-10-CM | POA: Diagnosis not present

## 2014-12-07 DIAGNOSIS — K5909 Other constipation: Secondary | ICD-10-CM

## 2014-12-07 DIAGNOSIS — R1010 Upper abdominal pain, unspecified: Secondary | ICD-10-CM

## 2014-12-07 MED ORDER — LINACLOTIDE 145 MCG PO CAPS: 145.0000 ug | ORAL_CAPSULE | Freq: Every day | ORAL | Status: DC
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
## 2014-12-12 ENCOUNTER — Ambulatory Visit: Payer: Medicare Other | Attending: Pain Medicine | Admitting: Pain Medicine

## 2014-12-12 ENCOUNTER — Ambulatory Visit: Payer: Medicare Other | Admitting: Pain Medicine

## 2014-12-12 VITALS — BP 133/73 | HR 94 | Temp 98.2°F | Resp 17 | Ht 67.0 in | Wt 265.0 lb

## 2014-12-12 DIAGNOSIS — M5030 Other cervical disc degeneration, unspecified cervical region: Secondary | ICD-10-CM | POA: Insufficient documentation

## 2014-12-12 DIAGNOSIS — M5134 Other intervertebral disc degeneration, thoracic region: Secondary | ICD-10-CM

## 2014-12-12 DIAGNOSIS — M533 Sacrococcygeal disorders, not elsewhere classified: Secondary | ICD-10-CM | POA: Insufficient documentation

## 2014-12-12 DIAGNOSIS — M545 Low back pain: Secondary | ICD-10-CM | POA: Diagnosis present

## 2014-12-12 DIAGNOSIS — M5136 Other intervertebral disc degeneration, lumbar region: Secondary | ICD-10-CM | POA: Insufficient documentation

## 2014-12-12 DIAGNOSIS — L97829 Non-pressure chronic ulcer of other part of left lower leg with unspecified severity: Secondary | ICD-10-CM | POA: Diagnosis not present

## 2014-12-12 DIAGNOSIS — M5481 Occipital neuralgia: Secondary | ICD-10-CM | POA: Diagnosis not present

## 2014-12-12 DIAGNOSIS — L97819 Non-pressure chronic ulcer of other part of right lower leg with unspecified severity: Secondary | ICD-10-CM | POA: Insufficient documentation

## 2014-12-12 DIAGNOSIS — M5126 Other intervertebral disc displacement, lumbar region: Secondary | ICD-10-CM | POA: Insufficient documentation

## 2014-12-12 DIAGNOSIS — M47816 Spondylosis without myelopathy or radiculopathy, lumbar region: Secondary | ICD-10-CM | POA: Insufficient documentation

## 2014-12-12 DIAGNOSIS — M170 Bilateral primary osteoarthritis of knee: Secondary | ICD-10-CM | POA: Insufficient documentation

## 2014-12-12 DIAGNOSIS — M961 Postlaminectomy syndrome, not elsewhere classified: Secondary | ICD-10-CM

## 2014-12-12 MED ORDER — OXYCODONE HCL 15 MG PO TABS: ORAL_TABLET | ORAL | Status: DC

## 2014-12-12 MED ORDER — TIZANIDINE HCL 4 MG PO TABS: ORAL_TABLET | ORAL | Status: DC

## 2014-12-12 NOTE — Progress Notes (Signed)
   Subjective:    Patient ID: Peggye Form, female    DOB: 11-27-1960, 54 y.o.   MRN: [id number]  HPI Patient 54 year old female returns to [facility] for further evaluation and treatment of pain involving the neck upper extremity regions mid lower back and lower extremity region. The present time patient is undergoing evaluation and treatment for lower extremity regions. We will avoid interventional treatment and will continue presently prescribed medications consisting of Zanaflex and oxycodone. Patient was understanding and in agreement with suggested treatment plan. We discussed patient undergoing follow-up evaluation with Dr. Ancil Boozer as well as wound care clinic evaluation and informed patient that we will remain available to consider patient for modifications of treatment regimen as felt to be necessary. The patient was understanding and in agreement with suggested treatment plan.      Review of Systems     Objective:   Physical Exam  There was mild tenderness of the splenius capitis and occipitalis musculature region. Palpation over the cervical facet cervical paraspinal musculature region and the thoracic facet and thoracic paraspinal musculature region reproduced mild discomfort. There were no new lesions of the head and neck noted. Patient appeared to be with slightly decreased grip strength. Tinel and Phalen's maneuver without increased pain of significant degree. There was unremarkable Spurling's maneuver. Palpation over the thoracic facet thoracic paraspinal musculature region was without crepitus of the thoracic region noted. Palpation over the lumbar paraspinal muscle region lumbar facet region associated with moderate discomfort. Lateral bending and rotation and extension and palpation over the lumbar facets reproduce moderately severe discomfort. There was tends to palpation of the region of the PSIS and PII S region a moderate degree as well straight leg raising limited  to approximately 20. There was apparently negative Homans patient was with bandage of the lower extremities. EHL strength appeared to be decreased. There was mild tenderness of the greater trochanteric region iliotibial band region. There appeared to be negative Homans. Abdomen was nontender and no costovertebral angle tenderness was noted.      Assessment & Plan:  Degenerative disc disease lumbar spine Degenerative changes of the lumbar spine with L4-L5 degenerative changes and central disc protrusion  Degenerative joint disease of knees  Degenerative disc disease of the cervical spine C5-6 right-sided involvement most progressed with compression of the C5 nerve root  Sacroiliac joint dysfunction  Lumbar facet syndrome  Greater occipital neuralgia  Lower extremity lesions with ulcerations of lower extremities    PLAN   Continue present medication Tizanidine and oxycodone  F/U PCP  Dr.Sowles for evaliation of  BP and general medical  condition  F/U surgical evaluation. May consider pending follow-up evaluations  F/U neurological evaluation. May consider pending follow-up evaluations  F/U wound care clinic evaluations  May consider radiofrequency rhizolysis or intraspinal procedures pending response to present treatment and F/U evaluation   Patient to call [facility] should patient have concerns prior to scheduled return appointment.

## 2014-12-12 NOTE — Patient Instructions (Signed)
PLAN   Continue present medication Zanaflex and oxycodone  F/U PCP Dr.Sowles  for evaliation of   lower extremity ulcers BP and general medical  condition  F/U surgical evaluation. May consider pending follow-up evaluations  F/U neurological evaluation. May consider pending follow-up evaluation  F/U wound care clinic  May consider radiofrequency rhizolysis or intraspinal procedures pending response to present treatment and F/U evaluation   Patient to call [facility] Center should patient have concerns prior to scheduled return appointment.

## 2014-12-17 ENCOUNTER — Encounter: Payer: Self-pay | Admitting: Family Medicine

## 2014-12-17 ENCOUNTER — Ambulatory Visit (INDEPENDENT_AMBULATORY_CARE_PROVIDER_SITE_OTHER): Payer: Medicare Other | Admitting: Family Medicine

## 2014-12-17 VITALS — BP 130/74 | HR 90 | Temp 98.2°F | Resp 18 | Ht 67.0 in | Wt 274.1 lb

## 2014-12-17 DIAGNOSIS — R1010 Upper abdominal pain, unspecified: Secondary | ICD-10-CM | POA: Diagnosis not present

## 2014-12-17 DIAGNOSIS — R002 Palpitations: Secondary | ICD-10-CM

## 2014-12-17 DIAGNOSIS — R0602 Shortness of breath: Secondary | ICD-10-CM | POA: Diagnosis not present

## 2014-12-17 DIAGNOSIS — S81802A Unspecified open wound, left lower leg, initial encounter: Secondary | ICD-10-CM | POA: Diagnosis not present

## 2014-12-17 DIAGNOSIS — M7989 Other specified soft tissue disorders: Secondary | ICD-10-CM | POA: Diagnosis not present
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
## 2014-12-19 ENCOUNTER — Other Ambulatory Visit: Payer: Self-pay | Admitting: Pain Medicine

## 2014-12-19 ENCOUNTER — Encounter: Payer: Medicare Other | Admitting: Pain Medicine

## 2014-12-27 ENCOUNTER — Other Ambulatory Visit (HOSPITAL_COMMUNITY): Payer: Medicare Other

## 2015-01-02 ENCOUNTER — Ambulatory Visit (HOSPITAL_COMMUNITY): Payer: Medicare Other | Attending: Cardiology

## 2015-01-02 ENCOUNTER — Other Ambulatory Visit: Payer: Self-pay

## 2015-01-02 ENCOUNTER — Ambulatory Visit: Payer: Medicare Other | Admitting: Family Medicine

## 2015-01-02 DIAGNOSIS — R0602 Shortness of breath: Secondary | ICD-10-CM

## 2015-01-02 DIAGNOSIS — Z6841 Body Mass Index (BMI) 40.0 and over, adult: Secondary | ICD-10-CM | POA: Insufficient documentation

## 2015-01-02 DIAGNOSIS — R0600 Dyspnea, unspecified: Secondary | ICD-10-CM | POA: Insufficient documentation

## 2015-01-02 DIAGNOSIS — E785 Hyperlipidemia, unspecified: Secondary | ICD-10-CM | POA: Insufficient documentation

## 2015-01-02 DIAGNOSIS — I517 Cardiomegaly: Secondary | ICD-10-CM | POA: Insufficient documentation

## 2015-01-02 DIAGNOSIS — M7989 Other specified soft tissue disorders: Secondary | ICD-10-CM | POA: Diagnosis not present

## 2015-01-02 DIAGNOSIS — I10 Essential (primary) hypertension: Secondary | ICD-10-CM | POA: Diagnosis not present

## 2015-01-03 ENCOUNTER — Other Ambulatory Visit: Payer: Self-pay | Admitting: Family Medicine

## 2015-01-03 ENCOUNTER — Encounter: Payer: Self-pay | Admitting: Family Medicine

## 2015-01-03 DIAGNOSIS — I5031 Acute diastolic (congestive) heart failure: Secondary | ICD-10-CM

## 2015-01-03 DIAGNOSIS — I5032 Chronic diastolic (congestive) heart failure: Secondary | ICD-10-CM | POA: Insufficient documentation

## 2015-01-03 DIAGNOSIS — I517 Cardiomegaly: Secondary | ICD-10-CM | POA: Insufficient documentation

## 2015-01-04 ENCOUNTER — Ambulatory Visit (INDEPENDENT_AMBULATORY_CARE_PROVIDER_SITE_OTHER): Payer: Medicare Other | Admitting: Family Medicine

## 2015-01-04 ENCOUNTER — Encounter: Payer: Self-pay | Admitting: Family Medicine

## 2015-01-04 ENCOUNTER — Other Ambulatory Visit: Payer: Self-pay

## 2015-01-04 VITALS — BP 124/86 | HR 106 | Temp 98.1°F | Resp 18 | Ht 67.0 in | Wt 269.1 lb

## 2015-01-04 DIAGNOSIS — K5909 Other constipation: Secondary | ICD-10-CM

## 2015-01-04 DIAGNOSIS — G4700 Insomnia, unspecified: Secondary | ICD-10-CM

## 2015-01-04 DIAGNOSIS — K219 Gastro-esophageal reflux disease without esophagitis: Secondary | ICD-10-CM | POA: Diagnosis not present

## 2015-01-04 DIAGNOSIS — Z9989 Dependence on other enabling machines and devices: Principal | ICD-10-CM

## 2015-01-04 DIAGNOSIS — I5030 Unspecified diastolic (congestive) heart failure: Secondary | ICD-10-CM

## 2015-01-04 DIAGNOSIS — I517 Cardiomegaly: Secondary | ICD-10-CM | POA: Diagnosis not present

## 2015-01-04 DIAGNOSIS — I10 Essential (primary) hypertension: Secondary | ICD-10-CM

## 2015-01-04 DIAGNOSIS — N3010 Interstitial cystitis (chronic) without hematuria: Secondary | ICD-10-CM | POA: Diagnosis not present

## 2015-01-04 DIAGNOSIS — A609 Anogenital herpesviral infection, unspecified: Secondary | ICD-10-CM

## 2015-01-04 DIAGNOSIS — F411 Generalized anxiety disorder: Secondary | ICD-10-CM

## 2015-01-04 DIAGNOSIS — A6009 Herpesviral infection of other urogenital tract: Secondary | ICD-10-CM

## 2015-01-04 DIAGNOSIS — G4733 Obstructive sleep apnea (adult) (pediatric): Secondary | ICD-10-CM

## 2015-01-04 DIAGNOSIS — M7989 Other specified soft tissue disorders: Secondary | ICD-10-CM

## 2015-01-04 DIAGNOSIS — G8929 Other chronic pain: Secondary | ICD-10-CM

## 2015-01-04 DIAGNOSIS — M25562 Pain in left knee: Secondary | ICD-10-CM

## 2015-01-04 DIAGNOSIS — Z6841 Body Mass Index (BMI) 40.0 and over, adult: Secondary | ICD-10-CM

## 2015-01-04 MED ORDER — NADOLOL 40 MG PO TABS: 40.0000 mg | ORAL_TABLET | Freq: Every day | ORAL | Status: DC

## 2015-01-04 MED ORDER — ACYCLOVIR 5 % EX OINT: 1.0000 "application " | TOPICAL_OINTMENT | Freq: Every day | CUTANEOUS | Status: DC | PRN

## 2015-01-04 MED ORDER — DIAZEPAM 5 MG PO TABS: 5.0000 mg | ORAL_TABLET | Freq: Four times a day (QID) | ORAL | Status: DC | PRN

## 2015-01-04 MED ORDER — ESOMEPRAZOLE MAGNESIUM 40 MG PO CPDR: 40.0000 mg | DELAYED_RELEASE_CAPSULE | Freq: Every day | ORAL | Status: DC

## 2015-01-04 MED ORDER — OLMESARTAN MEDOXOMIL-HCTZ 40-25 MG PO TABS: 1.0000 | ORAL_TABLET | Freq: Every day | ORAL | Status: DC

## 2015-01-04 MED ORDER — ZOLPIDEM TARTRATE 5 MG PO TABS: 5.0000 mg | ORAL_TABLET | Freq: Every evening | ORAL | Status: DC | PRN

## 2015-01-04 MED ORDER — LINACLOTIDE 145 MCG PO CAPS: 145.0000 ug | ORAL_CAPSULE | Freq: Every day | ORAL | Status: DC

## 2015-01-04 MED ORDER — MELOXICAM 7.5 MG PO TABS: 7.5000 mg | ORAL_TABLET | Freq: Two times a day (BID) | ORAL | Status: DC

## 2015-01-04 NOTE — Telephone Encounter (Signed)
Patient requesting refill. 
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
## 2015-01-10 ENCOUNTER — Ambulatory Visit: Payer: Medicare Other | Attending: Pain Medicine | Admitting: Pain Medicine

## 2015-01-10 ENCOUNTER — Encounter: Payer: Self-pay | Admitting: Pain Medicine

## 2015-01-10 VITALS — BP 135/79 | HR 101 | Temp 98.4°F | Resp 18 | Ht 67.0 in | Wt 260.0 lb

## 2015-01-10 DIAGNOSIS — M542 Cervicalgia: Secondary | ICD-10-CM | POA: Insufficient documentation

## 2015-01-10 DIAGNOSIS — M5134 Other intervertebral disc degeneration, thoracic region: Secondary | ICD-10-CM

## 2015-01-10 DIAGNOSIS — M549 Dorsalgia, unspecified: Secondary | ICD-10-CM | POA: Insufficient documentation

## 2015-01-10 DIAGNOSIS — I509 Heart failure, unspecified: Secondary | ICD-10-CM | POA: Diagnosis not present

## 2015-01-10 DIAGNOSIS — M170 Bilateral primary osteoarthritis of knee: Secondary | ICD-10-CM | POA: Diagnosis not present

## 2015-01-10 DIAGNOSIS — M5136 Other intervertebral disc degeneration, lumbar region: Secondary | ICD-10-CM | POA: Diagnosis not present

## 2015-01-10 DIAGNOSIS — M5030 Other cervical disc degeneration, unspecified cervical region: Secondary | ICD-10-CM

## 2015-01-10 DIAGNOSIS — M5481 Occipital neuralgia: Secondary | ICD-10-CM | POA: Diagnosis not present

## 2015-01-10 DIAGNOSIS — M533 Sacrococcygeal disorders, not elsewhere classified: Secondary | ICD-10-CM

## 2015-01-10 DIAGNOSIS — M5126 Other intervertebral disc displacement, lumbar region: Secondary | ICD-10-CM | POA: Insufficient documentation

## 2015-01-10 DIAGNOSIS — M961 Postlaminectomy syndrome, not elsewhere classified: Secondary | ICD-10-CM

## 2015-01-10 MED ORDER — TIZANIDINE HCL 4 MG PO TABS: ORAL_TABLET | ORAL | Status: DC

## 2015-01-10 MED ORDER — OXYCODONE HCL 15 MG PO TABS: ORAL_TABLET | ORAL | Status: DC

## 2015-01-10 NOTE — Progress Notes (Signed)
   Subjective:    Patient ID: Jacqueline Campbell, female    DOB: 1960/08/21, 54 y.o.   MRN: [id number]  HPI   The patient is a 54 year old female who [facility] for further evaluation and treatment of pain involving the region of the neck and entire back upper and lower extremity regions.. Present time patient was undergo further evaluation and general medical condition. The patient will follow up with cardiologist for evaluation of congestive heart failure. We will avoid interventional treatment and will continue patient's presently prescribed medications. The patient is with understanding and in agreement status treatment plan. The patient states that the pain is fairly well-controlled at this time.      Review of Systems     Objective:   Physical Exam  There was tends to palpation of the cervical paraspinal musculature region cervical facet region of mild degree. There was mild tenderness of the splenius capitis and occipitalis musculature regions. There was tenderness over the acromion acromioclavicular and glenohumeral joint regions of mild to moderate degree. There appeared to be unremarkable Spurling's maneuver. Patient appeared to be with lateral equal grip strength. Tinel and Phalen's maneuver were without increased pain of significant degree. Palpation over the region of the lower thoracic paraspinal musculature region was with evidence of moderate muscle spasms. Palpation over the lumbar paraspinal muscular region lumbar facet region was with mild to moderate discomfort. Lateral bending and rotation and extension and palpation of the lumbar facets reproduce mild to moderate discomfort. Leg raising was tolerates approximately 20 without definite increased pain with dorsiflexion noted. There was negative clonus negative Homans. There was tends to palpation of the PSIS and PII S region of moderate degree. There was question decreased sensation of the L5 dermatomal  distribution detected. There was mild tenderness of the greater trochanteric region and iliotibial band region. There was negative clonus negative Homans. Abdomen was nontender with no costovertebral angle tenderness noted.    Assessment & Plan:     Degenerative disc disease lumbar spine Degenerative changes of the lumbar spine with L4-L5 degenerative changes and central disc protrusion  Degenerative joint disease of knees  Degenerative disc disease of the cervical spine C5-6 right-sided involvement most progressed with compression of the C5 nerve root  Sacroiliac joint dysfunction  Lumbar facet syndrome  Greater occipital neuralgia     PLAN   Continue present medication Tizanidine and oxycodone  F/U PCP  Dr.Sowles for evaliation of  BP and general medical  Condition  Cardiac evaluation as planned and as scheduled. Patient with diagnosis of congestive heart failure will undergo further cardiac evaluation as planned  F/U surgical evaluation . At the present time patient we'll avoid additional surgical intervention until further evaluation of cardiac condition and general medical condition  F/U neurological evaluation. May consider pending follow-up evaluations  F/U vascular evaluation F/U wound care clinic evaluations  May consider radiofrequency rhizolysis or intraspinal procedures pending response to present treatment and F/U evaluation   Patient to call [facility] should patient have concerns prior to scheduled return appointment.

## 2015-01-10 NOTE — Progress Notes (Signed)
Safety precautions to be maintained throughout the outpatient stay will include: orient to surroundings, keep bed in low position, maintain call bell within reach at all times, provide assistance with transfer out of bed and ambulation.  

## 2015-01-10 NOTE — Patient Instructions (Addendum)
PLAN   Continue present medication Zanaflex and oxycodone  F/U PCP Dr.Sowles  for evaliation of   lower extremity ulcers BP and general medical  Condition  F/U cardiologist as planned  F/U vascular a valve as planned  F/U surgical evaluation. May consider pending follow-up evaluations  F/U neurological evaluation. May consider pending follow-up evaluation  F/U wound care clinic  May consider radiofrequency rhizolysis or intraspinal procedures pending response to present treatment and F/U evaluation   Patient to call [facility] should patient have concerns prior to scheduled return appointment.

## 2015-02-06 ENCOUNTER — Ambulatory Visit: Payer: Medicare Other | Admitting: Pain Medicine

## 2015-02-12 ENCOUNTER — Ambulatory Visit: Payer: Medicare Other | Attending: Pain Medicine | Admitting: Pain Medicine

## 2015-02-12 ENCOUNTER — Encounter: Payer: Self-pay | Admitting: Pain Medicine

## 2015-02-12 VITALS — BP 139/89 | HR 104 | Temp 97.7°F | Resp 18 | Ht 67.5 in | Wt 240.0 lb

## 2015-02-12 DIAGNOSIS — M5126 Other intervertebral disc displacement, lumbar region: Secondary | ICD-10-CM | POA: Insufficient documentation

## 2015-02-12 DIAGNOSIS — M5134 Other intervertebral disc degeneration, thoracic region: Secondary | ICD-10-CM

## 2015-02-12 DIAGNOSIS — M5030 Other cervical disc degeneration, unspecified cervical region: Secondary | ICD-10-CM | POA: Diagnosis not present

## 2015-02-12 DIAGNOSIS — M79605 Pain in left leg: Secondary | ICD-10-CM | POA: Diagnosis present

## 2015-02-12 DIAGNOSIS — M533 Sacrococcygeal disorders, not elsewhere classified: Secondary | ICD-10-CM | POA: Insufficient documentation

## 2015-02-12 DIAGNOSIS — M961 Postlaminectomy syndrome, not elsewhere classified: Secondary | ICD-10-CM

## 2015-02-12 DIAGNOSIS — M170 Bilateral primary osteoarthritis of knee: Secondary | ICD-10-CM | POA: Diagnosis not present

## 2015-02-12 DIAGNOSIS — M79604 Pain in right leg: Secondary | ICD-10-CM | POA: Diagnosis present

## 2015-02-12 DIAGNOSIS — M545 Low back pain: Secondary | ICD-10-CM | POA: Diagnosis present

## 2015-02-12 DIAGNOSIS — M47816 Spondylosis without myelopathy or radiculopathy, lumbar region: Secondary | ICD-10-CM | POA: Diagnosis not present

## 2015-02-12 DIAGNOSIS — M5136 Other intervertebral disc degeneration, lumbar region: Secondary | ICD-10-CM | POA: Diagnosis not present

## 2015-02-12 DIAGNOSIS — M5481 Occipital neuralgia: Secondary | ICD-10-CM | POA: Diagnosis not present

## 2015-02-12 DIAGNOSIS — L97909 Non-pressure chronic ulcer of unspecified part of unspecified lower leg with unspecified severity: Secondary | ICD-10-CM | POA: Insufficient documentation

## 2015-02-12 MED ORDER — TIZANIDINE HCL 4 MG PO TABS: ORAL_TABLET | ORAL | Status: DC

## 2015-02-12 MED ORDER — OXYCODONE HCL 15 MG PO TABS: ORAL_TABLET | ORAL | Status: DC

## 2015-02-12 NOTE — Progress Notes (Signed)
Safety precautions to be maintained throughout the outpatient stay will include: orient to surroundings, keep bed in low position, maintain call bell within reach at all times, provide assistance with transfer out of bed and ambulation.  

## 2015-02-12 NOTE — Patient Instructions (Addendum)
PLAN   Continue present medication Zanaflex and oxycodone  F/U PCP Dr.Sowles  for evaliation of   lower extremity ulcers BP and general medical condition  F/U cardiologist as planned   Dr.Gollan   F/U vascular a valve as planned Dr. Lucky Cowboy  F/U surgical evaluation. May consider pending follow-up evaluations  F/U urologists as planned  F/U neurological evaluation. May consider pending follow-up evaluation  F/U wound care clinic as discussed   May consider radiofrequency rhizolysis or intraspinal procedures pending response to present treatment and F/U evaluation   Patient to call [facility] should patient have concerns prior to scheduled return appointment.  A prescription for OXYCODONE was given to you today.  A prescription for ZANAFLEX was sent to your pharmacy and should be available for pickup today.

## 2015-02-12 NOTE — Progress Notes (Signed)
   Subjective:    Patient ID: Jacqueline Campbell, female    DOB: 10/15/1960, 54 y.o.   MRN: [id number]  HPI The patient is a 54 year old female who returns to pain management for further evaluation and treatment of pain involving the lower back and lower extremity region as well as neck upper extremity region. Patient also with history of pelvic pain. At the present time patient will undergo further evaluation of her cardiopulmonary status. We will continue medications as prescribed and avoid interventional treatment. The patient will also continue undergo further evaluation and treatment of her lower extremity condition with follow-up evaluations and wound care clinic and vascular evaluation as well. We will continue present medications of Zanaflex and oxycodone and remain available to consider modifications of treatment should they be significant change in patient's condition. The patient agreed to suggested treatment plan   Review of Systems     Objective:   Physical Exam  There was moderate tenderness of the splenius capitis and occipitalis musculature regions. Palpation of the acromioclavicular and glenohumeral joint regions reproduce moderate discomfort. Patient appeared to be unremarkable Spurling's maneuver. Patient was with minimal increase of pain with Tinel and Phalen's maneuver. Palpation over the thoracic facet thoracic paraspinal musculature region as well as the cervical facet cervical thoracic paraspinal musculature regions reproduced pain of moderate degree. Palpation over the lumbar paraspinal musculature region lumbar facet region was attends to palpation of moderate degree. Extension and palpation of the lumbar facets reproduce moderate discomfort. There was moderate tenderness to palpation of the PSIS and PII S region as well as the gluteal and piriformis musculature regions. Palpation of the greater trochanteric region and iliotibial band regions reproduced pain of moderate degree.  Palpation of the greater trochanteric region was attends to palpation on the left as well as on the right straight leg raising was tolerates approximately 20 without increased pain with dorsiflexion noted. No definite sensory deficit of dermatomal distribution detected. There was negative clonus negative Homans. No active lesions of the lower extremity were noted. Abdomen nontender with no costovertebral tenderness noted      Assessment & Plan:    Degenerative disc disease lumbar spine Degenerative changes of the lumbar spine with L4-L5 degenerative changes and central disc protrusion  Degenerative joint disease of knees  Degenerative disc disease of the cervical spine C5-6 right-sided involvement most progressed with compression of the C5 nerve root  Sacroiliac joint dysfunction  Lumbar facet syndrome  Greater occipital neuralgia  Lower extremity ulcerations (no active lesions noted on today's evaluation)     PLAN   Continue present medication Zanaflex and oxycodone  F/U PCP Dr.Sowles  for evaliation of   lower extremity ulcers BP and general medical condition  F/U cardiologist as planned   Dr.Gollan   F/U vascular a valve as planned Dr. Lucky Cowboy  F/U surgical evaluation. May consider pending follow-up evaluations  F/U urologists as planned  F/U neurological evaluation. May consider pending follow-up evaluation  F/U wound care clinic as discussed   May consider radiofrequency rhizolysis or intraspinal procedures pending response to present treatment and F/U evaluation   Patient to call [facility] should patient have concerns prior to scheduled return appointment.

## 2015-03-01 ENCOUNTER — Other Ambulatory Visit: Payer: Self-pay

## 2015-03-01 DIAGNOSIS — G4700 Insomnia, unspecified: Secondary | ICD-10-CM

## 2015-03-01 MED ORDER — PROMETHAZINE HCL 25 MG PO TABS: 25.0000 mg | ORAL_TABLET | Freq: Four times a day (QID) | ORAL | Status: DC | PRN

## 2015-03-01 MED ORDER — CLONIDINE HCL 0.1 MG/24HR TD PTWK: 0.1000 mg | MEDICATED_PATCH | TRANSDERMAL | Status: DC

## 2015-03-01 MED ORDER — TRAZODONE HCL 50 MG PO TABS: 25.0000 mg | ORAL_TABLET | Freq: Every evening | ORAL | Status: DC | PRN

## 2015-03-07 ENCOUNTER — Ambulatory Visit: Payer: Medicare Other | Attending: Pain Medicine | Admitting: Pain Medicine

## 2015-03-07 ENCOUNTER — Encounter: Payer: Self-pay | Admitting: Pain Medicine

## 2015-03-07 VITALS — BP 151/98 | HR 105 | Temp 98.0°F | Resp 16 | Wt 255.0 lb

## 2015-03-07 DIAGNOSIS — M47816 Spondylosis without myelopathy or radiculopathy, lumbar region: Secondary | ICD-10-CM | POA: Insufficient documentation

## 2015-03-07 DIAGNOSIS — M5134 Other intervertebral disc degeneration, thoracic region: Secondary | ICD-10-CM

## 2015-03-07 DIAGNOSIS — M170 Bilateral primary osteoarthritis of knee: Secondary | ICD-10-CM | POA: Diagnosis not present

## 2015-03-07 DIAGNOSIS — M5030 Other cervical disc degeneration, unspecified cervical region: Secondary | ICD-10-CM | POA: Diagnosis not present

## 2015-03-07 DIAGNOSIS — M545 Low back pain: Secondary | ICD-10-CM | POA: Diagnosis present

## 2015-03-07 DIAGNOSIS — M5481 Occipital neuralgia: Secondary | ICD-10-CM | POA: Diagnosis not present

## 2015-03-07 DIAGNOSIS — R51 Headache: Secondary | ICD-10-CM | POA: Diagnosis present

## 2015-03-07 DIAGNOSIS — M533 Sacrococcygeal disorders, not elsewhere classified: Secondary | ICD-10-CM | POA: Insufficient documentation

## 2015-03-07 DIAGNOSIS — M542 Cervicalgia: Secondary | ICD-10-CM | POA: Diagnosis present

## 2015-03-07 DIAGNOSIS — M5136 Other intervertebral disc degeneration, lumbar region: Secondary | ICD-10-CM | POA: Diagnosis not present

## 2015-03-07 DIAGNOSIS — M5126 Other intervertebral disc displacement, lumbar region: Secondary | ICD-10-CM | POA: Diagnosis not present

## 2015-03-07 DIAGNOSIS — M961 Postlaminectomy syndrome, not elsewhere classified: Secondary | ICD-10-CM

## 2015-03-07 MED ORDER — TIZANIDINE HCL 4 MG PO TABS: ORAL_TABLET | ORAL | Status: DC

## 2015-03-07 MED ORDER — OXYCODONE HCL 15 MG PO TABS: ORAL_TABLET | ORAL | Status: DC

## 2015-03-07 NOTE — Patient Instructions (Addendum)
PLAN   Continue present medication Zanaflex and oxycodone  F/U PCP Dr.Sowles  for evaliation of   lower extremity ulcers BP and general medical condition  F/U cardiologist as planned   Dr.Gollan . Procedure by Dr.:Gollan to be performed as planned   F/U vascular a valve as planned Dr. Lucky Cowboy  F/U surgical evaluation. May consider pending follow-up evaluations  F/U urologists as planned  F/U neurological evaluation. May consider pending follow-up evaluation  F/U wound care clinic as discussed   May consider radiofrequency rhizolysis or intraspinal procedures pending response to present treatment and F/U evaluation   Patient to call [facility] should patient have concerns prior to scheduled return appointment.

## 2015-03-07 NOTE — Progress Notes (Signed)
Safety precautions to be maintained throughout the outpatient stay will include: orient to surroundings, keep bed in low position, maintain call bell within reach at all times, provide assistance with transfer out of bed and ambulation.  

## 2015-03-07 NOTE — Progress Notes (Signed)
Subjective:    Patient ID: Jacqueline Campbell, female    DOB: 01/27/1961, 54 y.o.   MRN: [id number]  HPI  The patient is a 54 year old female who returns to pain management Center for further evaluation and treatment of pain involving the neck headaches upper mid lower back and lower extremity regions. At the present time we will avoid interventional treatment and we'll continue present medications consisting of Zanaflex and oxycodone. The patient is scheduled to undergo further cardiac evaluation with consideration being given to procedure consisting of insertion of cardiac stents. The patient will undergo follow-up neurological evaluations as well as follow her primary care physician Dr.Sowles The patient has had improvement of lower extremity lesions and will follow-up with the wound care clinic as needed. The patient denies any other significant changes in her condition at this time. We remain available to modify treatment regimen pending follow-up evaluation with cardiologist as well as with primary care physician and other physicians. The patient was with understanding and in agreement with suggested treatment plan      Review of Systems     Objective:   Physical Exam  There was tenderness to palpation of paraspinal musculature region the cervical region cervical facet region. There appeared to be unremarkable Spurling's maneuver. There was tends to palpation over the cervical facet cervical paraspinal musculature region of mild degree. There was mild tenderness to palpation of the acromioclavicular and glenohumeral joint regions. Patient appeared to be with slightly decreased grip strength. Tinel and Phalen's maneuver were without increased pain of significant degree. Palpation over the thoracic region thoracic facet region was attends to palpation of mild to moderate tenderness to palpation of the upper and mid thoracic regions and moderate tenderness to palpation over the lower thoracic  paraspinal musculature region with no crepitus of the thoracic region noted. Palpation over the lumbar paraspinal musculatures and lumbar facet region was with moderate tenderness to palpation with lateral bending rotation extension and palpation of the lumbar facets reproducing moderate discomfort. Straight leg raising was limited to approximately 20 without increased pain with dorsiflexion noted. EHL strength appeared to be decreased. No definite sensory deficit or dermatomal distribution detected. There was moderate tenderness to palpation of the PSIS and PII S region as well as the greater trochanteric region. No new lesions of the lower extremities were noted The knees were tenderness to palpation with crepitus of the knees with negative anterior and posterior drawer signs without ballottement of the patella. DTRs were difficult to this patient had difficulty relaxing. Abdomen was nontender with no costovertebral tenderness noted    Assessment & Plan:     Degenerative disc disease lumbar spine Degenerative changes of the lumbar spine with L4-L5 degenerative changes and central disc protrusion  Degenerative joint disease of knees  Degenerative disc disease of the cervical spine C5-6 right-sided involvement most progressed with compression of the C5 nerve root  Sacroiliac joint dysfunction  Lumbar facet syndrome  Greater occipital neuralgia    PLAN   Continue present medication Zanaflex and oxycodone  F/U PCP Dr.Sowles  for evaliation of   lower extremity ulcers BP and general medical condition  F/U cardiologist as planned   Dr.Gollan . Procedure by Dr.:Gollan to be performed as planned   F/U vascular a valve as planned Dr. Lucky Cowboy  F/U surgical evaluation. May consider pending follow-up evaluations  F/U urologists as planned  F/U neurological evaluation. May consider pending follow-up evaluation  F/U wound care clinic as discussed   F/U psych evaluation  as discussed  May  consider radiofrequency rhizolysis or intraspinal procedures pending response to present treatment and F/U evaluation   Patient to call Pain Management Center should patient have concerns prior to scheduled return appointment.

## 2015-03-20 ENCOUNTER — Ambulatory Visit (INDEPENDENT_AMBULATORY_CARE_PROVIDER_SITE_OTHER): Payer: Medicare Other | Admitting: Cardiovascular Disease

## 2015-03-20 ENCOUNTER — Encounter: Payer: Self-pay | Admitting: Cardiovascular Disease

## 2015-03-20 VITALS — BP 110/90 | HR 84 | Ht 67.5 in | Wt 280.0 lb

## 2015-03-20 DIAGNOSIS — I5032 Chronic diastolic (congestive) heart failure: Secondary | ICD-10-CM | POA: Diagnosis not present

## 2015-03-20 DIAGNOSIS — E785 Hyperlipidemia, unspecified: Secondary | ICD-10-CM

## 2015-03-20 DIAGNOSIS — Z9989 Dependence on other enabling machines and devices: Secondary | ICD-10-CM

## 2015-03-20 DIAGNOSIS — Z6841 Body Mass Index (BMI) 40.0 and over, adult: Secondary | ICD-10-CM

## 2015-03-20 DIAGNOSIS — M7989 Other specified soft tissue disorders: Secondary | ICD-10-CM

## 2015-03-20 DIAGNOSIS — G4733 Obstructive sleep apnea (adult) (pediatric): Secondary | ICD-10-CM | POA: Diagnosis not present

## 2015-03-20 DIAGNOSIS — R079 Chest pain, unspecified: Secondary | ICD-10-CM | POA: Diagnosis not present

## 2015-03-20 DIAGNOSIS — I10 Essential (primary) hypertension: Secondary | ICD-10-CM | POA: Diagnosis not present

## 2015-03-20 NOTE — Progress Notes (Signed)
Patient ID: ROSAN CABEZAS, female    DOB: 11-Mar-1961, 55 y.o.   MRN: [id number]  HPI Comments: Ms. Gourd is a 55 year old woman with morbid obesity, general anxiety disorder, chronic cystitis. Obstructive sleep apnea on CPAP, worsening symptoms of lower extremity edema starting August 2016, not seen significant improvement on Lasix 20 mg 3 times a day,  seen by Dr.Dew, had legs wrapped for blistering, with improvement of her symptoms, presents today for diastolic CHF, leg edema  She reports that her legs have been better since the leg wrap. 80 on 2 occasions they started to get big again but then improved. She does drink significant fluids in the daytime, water, diet soda, large juice among other drinks. Mouth is always dry, likes to drink For now she has been taking Lasix 20 mg twice a day. On this her leg swelling has been relatively stable recently still with some swelling. She has compression hose at home, does not wear them on a regular basis Periods of insomnia, Reports having significant sweating in the morning, feels very hot when she first wakes up. Has to go stand outside for a few minutes.  Her biggest complaint is the rapid weight gain starting in August September control reports that she went up 45 pounds Denies changes to her diet in that time Feels that her dry weight should be 220 pounds, down 60 pounds  Echocardiogram reviewed with her showing normal LV function, evidence of diastolic dysfunction  normal right heart pressures in October 2016  Prior records reviewed showing prerenal azotemia, dehydration September 2015 She was taking high dose Lasix at that time, kidney function improved with IV hydration.  EKG on today's visit shows normal sinus rhythm with rate 84 bpm, no significant ST or T-wave changes    Allergies  Allergen Reactions  . Red Dye Anaphylaxis  . Abilify [Aripiprazole]   . Amitiza [Lubiprostone]   . Benadryl [Diphenhydramine Hcl]   . Doxycycline    . Gabapentin   . Iohexol   . Lac Bovis Swelling  . Risperidone And Related   . Shellfish Allergy   . Strawberry (Diagnostic) Swelling  . Strawberry Extract Swelling  . Aspirin Rash  . Ciprofloxacin Rash  . Enablex [Darifenacin] Rash  . Iodine Rash  . Latex Rash  . Levofloxacin Rash  . Penicillins Rash  . Risperidone Rash  . Sulfa Antibiotics Rash    Current Outpatient Prescriptions on File Prior to Visit  Medication Sig Dispense Refill  . acyclovir ointment (ZOVIRAX) 5 % Apply 1 application topically 5 (five) times daily as needed. 30 g 5  . clindamycin (CLEOCIN) 150 MG capsule Take 150 mg by mouth 4 (four) times daily.    . cloNIDine (CATAPRES - DOSED IN MG/24 HR) 0.1 mg/24hr patch Place 1 patch (0.1 mg total) onto the skin once a week. 4 patch 2  . diazepam (VALIUM) 5 MG tablet Take 1 tablet (5 mg total) by mouth every 6 (six) hours as needed for anxiety. 120 tablet 2  . dorzolamide (TRUSOPT) 2 % ophthalmic solution Place 1 drop into both eyes 3 (three) times daily.    Marland Kitchen EPINEPHRINE IJ Inject as directed as needed. Epi pen    . esomeprazole (NEXIUM) 40 MG capsule Take 1 capsule (40 mg total) by mouth daily before breakfast. 30 capsule 5  . Fluticasone-Salmeterol (ADVAIR) 500-50 MCG/DOSE AEPB Inhale 1 puff into the lungs every 12 (twelve) hours.    . furosemide (LASIX) 20 MG tablet TAKE ONE TABLET TWICE  DAILY 60 tablet 5  . Linaclotide (LINZESS) 145 MCG CAPS capsule Take 1 capsule (145 mcg total) by mouth daily. 30 capsule 5  . loratadine (CLARITIN) 10 MG tablet TAKE ONE (1) TABLET EACH DAY 30 tablet 5  . meloxicam (MOBIC) 7.5 MG tablet Take 1 tablet (7.5 mg total) by mouth 2 (two) times daily after a meal. 60 tablet 2  . montelukast (SINGULAIR) 10 MG tablet TAKE ONE (1) TABLET EACH DAY 30 tablet 5  . nadolol (CORGARD) 40 MG tablet Take 1 tablet (40 mg total) by mouth at bedtime. 30 tablet 2  . NASONEX 50 MCG/ACT nasal spray USE 2 SPRAYS IN EACH NOSTRIL ONCE A DAY AT BEDTIME 30 g  5  . olmesartan-hydrochlorothiazide (BENICAR HCT) 40-25 MG tablet Take 1 tablet by mouth daily. 30 tablet 5  . oxyCODONE (ROXICODONE) 15 MG immediate release tablet Limit 1 tab by mouth 3-6 times per day if tolerated 180 tablet 0  . pentosan polysulfate (ELMIRON) 100 MG capsule Take 1 capsule (100 mg total) by mouth 3 (three) times daily before meals. 90 capsule 12  . potassium chloride SA (K-DUR,KLOR-CON) 20 MEQ tablet TAKE ONE (1) TABLET EACH DAY 30 tablet 5  . promethazine (PHENERGAN) 25 MG tablet Take 1 tablet (25 mg total) by mouth every 6 (six) hours as needed for nausea. 30 tablet 2  . tiZANidine (ZANAFLEX) 4 MG tablet Limit 1 tablet by mouth 3-5 times per day if tolerated 150 tablet 0  . traZODone (DESYREL) 50 MG tablet Take 0.5-1.5 tablets (25-75 mg total) by mouth at bedtime as needed for sleep. 45 tablet 2  . valACYclovir (VALTREX) 500 MG tablet Take 1 tablet (500 mg total) by mouth 1 day or 1 dose. 30 tablet 12  . zolpidem (AMBIEN) 5 MG tablet Take 1 tablet (5 mg total) by mouth at bedtime as needed for sleep. 30 tablet 2   No current facility-administered medications on file prior to visit.    Past Medical History  Diagnosis Date  . Asthma   . Glaucoma   . GERD (gastroesophageal reflux disease)   . Cancer ([place]) 1992    cervical  . Ovarian cancer ([place]) 1994  . Throat cancer ([place]) 1998  . Metabolic syndrome   . Obesity   . Chronic insomnia   . Mood disorder ([place])   . Bladder incontinence   . Plantar fasciitis   . Chronic interstitial cystitis   . Nausea     Chronic  . CHF (congestive heart failure) ([place])   . Hypertension     Past Surgical History  Procedure Laterality Date  . Tubal ligation  1989  . Abdominal hysterectomy  1992  . Tonsillectomy and adenoidectomy  1994  . Foot surgery Bilateral 1996  . Knee surgery Left     age 55  . Eye surgery Bilateral 2006, 2008, 2010  . Bladder surgery      multiple  . Breast biopsy Right     Social History  reports  that she has never smoked. She has never used smokeless tobacco. She reports that she does not drink alcohol or use illicit drugs.  Family History family history includes Breast cancer in her mother; COPD in her mother; Cancer in her father; Diabetes in her mother; Heart disease in her father and mother; Hypertension in her mother; Leukemia in her son; Prostate cancer in her brother; Stroke in her mother.   Review of Systems  Constitutional: Negative.        Feels  very hot first thing in the morning  Respiratory: Negative.   Cardiovascular: Positive for leg swelling.  Gastrointestinal: Negative.   Musculoskeletal: Negative.   Neurological: Negative.   Hematological: Negative.   Psychiatric/Behavioral: Negative.   All other systems reviewed and are negative.   BP 110/90 mmHg  Pulse 84  Ht 5' 7.5" (1.715 m)  Wt 280 lb (127.007 kg)  BMI 43.18 kg/m2  Physical Exam  Constitutional: She is oriented to person, place, and time. She appears well-developed and well-nourished.  Morbidly obese  HENT:  Head: Normocephalic.  Nose: Nose normal.  Mouth/Throat: Oropharynx is clear and moist.  Eyes: Conjunctivae are normal. Pupils are equal, round, and reactive to light.  Neck: Normal range of motion. Neck supple. No JVD present.  Cardiovascular: Normal rate, regular rhythm, normal heart sounds and intact distal pulses.  Exam reveals no gallop and no friction rub.   No murmur heard. Trace pitting edema to below the knees bilaterally  Pulmonary/Chest: Effort normal and breath sounds normal. No respiratory distress. She has no wheezes. She has no rales. She exhibits no tenderness.  Abdominal: Soft. Bowel sounds are normal. She exhibits no distension. There is no tenderness.  Musculoskeletal: Normal range of motion. She exhibits no edema or tenderness.  Lymphadenopathy:    She has no cervical adenopathy.  Neurological: She is alert and oriented to person, place, and time. Coordination normal.   Skin: Skin is warm and dry. No rash noted. No erythema.  Psychiatric: She has a normal mood and affect. Her behavior is normal. Judgment and thought content normal.

## 2015-03-20 NOTE — Patient Instructions (Signed)
You are doing well.  Please take extra lasix sparingly for leg edema, shortness of breath, Lab work with Dr. Ancil Boozer  Try to cut back on fluids as much as toloerated  Please call us if you have new issues that need to be addressed before your next appt.  Your physician wants you to follow-up in: 3 months.  You will receive a reminder letter in the mail two months in advance. If you don't receive a letter, please call our office to schedule the follow-up appointment.

## 2015-03-20 NOTE — Assessment & Plan Note (Signed)
She reports a rapid weight gain of 45 pounds over the late summer He denies change in her diet or eating high carbohydrate foods Needs to start a aggressive diet plan, needs regular exercise May be limited by chronic pain issues

## 2015-03-20 NOTE — Assessment & Plan Note (Signed)
Diastolic CHF likely secondary to morbid obesity, sleep apnea Stressed importance of weight loss Long discussion concerning her fluid intake. Initially denied drinking that much and reported that she drank lots of fluids as her mouth is always dry, water, diet soda, orange juice etc. Suggested she try to decrease some of her volume Could also increase Lasix up slightly, add extra 20 mg dose here and therefore days when swelling is worse We'll try to avoid a repeat of dehydration as seen July 2015, when creatinine was greater than 2 Recommended compression hose If leg swelling is worse, recommended that she call our office

## 2015-03-20 NOTE — Assessment & Plan Note (Addendum)
Blood pressure is well controlled on today's visit. No changes made to the medications. No medication changes made at this time. I'm not opposed to nadolol. Uncertain if she would have dramatic improvement by changing to carvedilol or metoprolol succinate.  Already on an ARB

## 2015-03-20 NOTE — Assessment & Plan Note (Signed)
Suspect she has component of diastolic CHF contributing to edema, unable to exclude component of venous insufficiency, possibly exacerbated by her weight She may do better on compression hose on a regular basis

## 2015-03-20 NOTE — Assessment & Plan Note (Signed)
Currently does not appear to be on a statin Cholesterol 180, not particularly elevated

## 2015-03-20 NOTE — Assessment & Plan Note (Signed)
We have stressed the importance of weight loss, compliance with her CPAP

## 2015-03-22 ENCOUNTER — Other Ambulatory Visit: Payer: Self-pay | Admitting: Pain Medicine

## 2015-04-03 ENCOUNTER — Other Ambulatory Visit: Payer: Self-pay

## 2015-04-03 DIAGNOSIS — G4700 Insomnia, unspecified: Secondary | ICD-10-CM

## 2015-04-03 DIAGNOSIS — F411 Generalized anxiety disorder: Secondary | ICD-10-CM

## 2015-04-03 NOTE — Telephone Encounter (Signed)
Got a fax from [facility] requesting a refill of the above medications.  Refill request was sent to Dr. Steele Sizer for approval and submission.

## 2015-04-04 ENCOUNTER — Ambulatory Visit: Payer: Medicare Other | Attending: Pain Medicine | Admitting: Pain Medicine

## 2015-04-04 ENCOUNTER — Encounter: Payer: Self-pay | Admitting: Pain Medicine

## 2015-04-04 VITALS — BP 131/77 | HR 101 | Temp 97.6°F | Resp 16 | Ht 67.0 in | Wt 257.0 lb

## 2015-04-04 DIAGNOSIS — M533 Sacrococcygeal disorders, not elsewhere classified: Secondary | ICD-10-CM | POA: Diagnosis not present

## 2015-04-04 DIAGNOSIS — M5136 Other intervertebral disc degeneration, lumbar region: Secondary | ICD-10-CM | POA: Diagnosis not present

## 2015-04-04 DIAGNOSIS — M546 Pain in thoracic spine: Secondary | ICD-10-CM | POA: Diagnosis present

## 2015-04-04 DIAGNOSIS — M5481 Occipital neuralgia: Secondary | ICD-10-CM | POA: Insufficient documentation

## 2015-04-04 DIAGNOSIS — M5126 Other intervertebral disc displacement, lumbar region: Secondary | ICD-10-CM | POA: Insufficient documentation

## 2015-04-04 DIAGNOSIS — M47816 Spondylosis without myelopathy or radiculopathy, lumbar region: Secondary | ICD-10-CM | POA: Diagnosis not present

## 2015-04-04 DIAGNOSIS — M542 Cervicalgia: Secondary | ICD-10-CM | POA: Diagnosis present

## 2015-04-04 DIAGNOSIS — M170 Bilateral primary osteoarthritis of knee: Secondary | ICD-10-CM | POA: Diagnosis not present

## 2015-04-04 DIAGNOSIS — M5030 Other cervical disc degeneration, unspecified cervical region: Secondary | ICD-10-CM | POA: Diagnosis not present

## 2015-04-04 DIAGNOSIS — M5134 Other intervertebral disc degeneration, thoracic region: Secondary | ICD-10-CM

## 2015-04-04 DIAGNOSIS — M961 Postlaminectomy syndrome, not elsewhere classified: Secondary | ICD-10-CM

## 2015-04-04 DIAGNOSIS — R51 Headache: Secondary | ICD-10-CM | POA: Diagnosis present

## 2015-04-04 MED ORDER — TIZANIDINE HCL 4 MG PO TABS: ORAL_TABLET | ORAL | Status: DC

## 2015-04-04 MED ORDER — OXYCODONE HCL 15 MG PO TABS: ORAL_TABLET | ORAL | Status: DC

## 2015-04-04 NOTE — Patient Instructions (Addendum)
PLAN   Continue present medication Zanaflex.  F/U PCP Dr.Sowles to discuss UDS results and need for continued psych evaluation and treatment at this time as well as for evaliation of coughing, lower extremity ulcers, BP, and general medical condition. Please see Dr.Sowles today   F/U cardiologist   Dr.Gollan .   Psych evaluation. Please see psychiatrist this week to discuss UDS results and for continued follow-up evaluation and treatment as discussed  Repeat UDS today   F/U vascular a valve as planned Dr. Lucky Cowboy  F/U surgical evaluation. May consider pending follow-up evaluations  F/U urologist as planned  F/U neurological evaluation. May consider pending follow-up evaluation  F/U wound care clinic as discussed   May consider radiofrequency rhizolysis or intraspinal procedures pending response to present treatment and F/U evaluation . We will avoid interventional treatment at this time  Patient to call Pain Management Center should patient have concerns prior to scheduled return appointment.

## 2015-04-04 NOTE — Progress Notes (Signed)
Subjective:    Patient ID: Jacqueline Campbell, female    DOB: 1961/03/04, 55 y.o.   MRN: [id number]  HPI  The patient is a 55 year old female who returns to pain management Center for further evaluation and treatment of pain involving the region of the neck entire back upper and lower extremity regions as well as headaches. The patient is status post prior surgical intervention of the lumbar region. The patient is with multiple arthralgias and myalgias. At the present time we will avoid interventional treatment. The patient has undergone cardiac evaluation with Dr.Gollan and continues to undergo surgical evaluations with consideration being given to additional surgery including urological surgery. We discussed patient's condition including the UDS results on today's visit which were with abnormalities and consistent with patient's medication agreement. We discussed abnormalities with the patient and have informed patient that patient most follow-up with her primary care physician and psychiatrist at this time as well as repeat urine drug screen. We informed patient that we would prescribed her present medications at a reduced amount due to the urine drug screen inconsistencies and that we will consider further treatment and patient pending review of additional information including patient's psychiatrist as discussed and explained to patient on today's visit. The patient was most understanding and cooperative and expressed willingness to comply with suggested treatment regimen   Review of Systems     Objective:   Physical Exam There was tenderness to palpation of paraspinal muscular region cervical region cervical facet region palpation which reproduces pain there was tenderness of the splenius capitis and occipitalis musculature regions a moderate degree. There was mild to moderate tenderness of the acromioclavicular and glenohumeral joint region. Patient was with bilaterally equal grip strength and  Tinel and Phalen's maneuver were without increased pain of significant degree. Palpation of the thoracic facet thoracic paraspinal musculature he was with moderate tends to palpation with moderate muscle spasms of the mid and lower thoracic musculature regions with no crepitus of the thoracic region noted. Palpation over the region of the lumbar paraspinal musculatures and lumbar facet region was attends to palpation of moderate degree with lateral bending rotation extension and palpation of the lumbar facets reproducing moderate discomfort. There was tenderness in the region of the PSIS and PII S region a moderate to moderately severe degree with mild tenderness of the greater trochanteric region iliotibial band region. Straight leg raise was tolerates approximately 20 without increased pain with dorsiflexion noted. There appeared to be negative clonus negative Homans and DTRs appeared to be 1+ at the knees. There was no definite sensory deficit or dermatomal distribution detected. Abdomen was nontender with no costovertebral angle tenderness noted.     Assessment & Plan:      Degenerative disc disease lumbar spine Degenerative changes of the lumbar spine with L4-L5 degenerative changes and central disc protrusion  Degenerative joint disease of knees  Degenerative disc disease of the cervical spine C5-6 right-sided involvement most progressed with compression of the C5 nerve root  Sacroiliac joint dysfunction  Lumbar facet syndrome  Greater occipital neuralgia      PLAN   Continue present medication Zanaflex.  F/U PCP Dr.Sowles to discuss UDS results and need for continued psych evaluation and treatment at this time as well as for evaliation of coughing, lower extremity ulcers, BP, and general medical condition. Please see Dr.Sowles today   F/U cardiologist   Dr.Gollan .   Psych evaluation. Please see psychiatrist this week to discuss UDS results and for continued follow-up  evaluation and treatment as discussed  Repeat UDS today   F/U vascular a valve as planned Dr. Lucky Cowboy  F/U surgical evaluation. May consider pending follow-up evaluations  F/U urologist as planned  F/U neurological evaluation. May consider pending follow-up evaluation  F/U wound care clinic as discussed   May consider radiofrequency rhizolysis or intraspinal procedures pending response to present treatment and F/U evaluation . We will avoid interventional treatment at this time  Patient to call Pain Management Center should patient have concerns prior to scheduled return appointment.

## 2015-04-04 NOTE — Progress Notes (Signed)
Safety precautions to be maintained throughout the outpatient stay will include: orient to surroundings, keep bed in low position, maintain call bell within reach at all times, provide assistance with transfer out of bed and ambulation.  

## 2015-04-09 ENCOUNTER — Ambulatory Visit (INDEPENDENT_AMBULATORY_CARE_PROVIDER_SITE_OTHER): Payer: Medicare Other | Admitting: Family Medicine

## 2015-04-09 ENCOUNTER — Encounter: Payer: Self-pay | Admitting: Family Medicine

## 2015-04-09 VITALS — BP 140/90 | HR 108 | Temp 97.7°F | Resp 18 | Ht 67.0 in | Wt 276.2 lb

## 2015-04-09 DIAGNOSIS — J309 Allergic rhinitis, unspecified: Secondary | ICD-10-CM

## 2015-04-09 DIAGNOSIS — Z9989 Dependence on other enabling machines and devices: Principal | ICD-10-CM

## 2015-04-09 DIAGNOSIS — G8929 Other chronic pain: Secondary | ICD-10-CM

## 2015-04-09 DIAGNOSIS — I5030 Unspecified diastolic (congestive) heart failure: Secondary | ICD-10-CM | POA: Diagnosis not present

## 2015-04-09 DIAGNOSIS — F411 Generalized anxiety disorder: Secondary | ICD-10-CM | POA: Diagnosis not present

## 2015-04-09 DIAGNOSIS — I10 Essential (primary) hypertension: Secondary | ICD-10-CM

## 2015-04-09 DIAGNOSIS — M7989 Other specified soft tissue disorders: Secondary | ICD-10-CM | POA: Diagnosis not present

## 2015-04-09 DIAGNOSIS — Z23 Encounter for immunization: Secondary | ICD-10-CM

## 2015-04-09 DIAGNOSIS — G4700 Insomnia, unspecified: Secondary | ICD-10-CM | POA: Diagnosis not present

## 2015-04-09 DIAGNOSIS — R7989 Other specified abnormal findings of blood chemistry: Secondary | ICD-10-CM | POA: Diagnosis not present

## 2015-04-09 DIAGNOSIS — Z6841 Body Mass Index (BMI) 40.0 and over, adult: Secondary | ICD-10-CM

## 2015-04-09 DIAGNOSIS — J4540 Moderate persistent asthma, uncomplicated: Secondary | ICD-10-CM

## 2015-04-09 DIAGNOSIS — M25562 Pain in left knee: Secondary | ICD-10-CM | POA: Diagnosis not present

## 2015-04-09 DIAGNOSIS — K5909 Other constipation: Secondary | ICD-10-CM | POA: Diagnosis not present

## 2015-04-09 DIAGNOSIS — J3089 Other allergic rhinitis: Secondary | ICD-10-CM

## 2015-04-09 DIAGNOSIS — G4733 Obstructive sleep apnea (adult) (pediatric): Secondary | ICD-10-CM | POA: Diagnosis not present

## 2015-04-09 MED ORDER — OLMESARTAN MEDOXOMIL-HCTZ 40-25 MG PO TABS: 1.0000 | ORAL_TABLET | Freq: Every day | ORAL | Status: DC

## 2015-04-09 MED ORDER — ZOLPIDEM TARTRATE 5 MG PO TABS: 5.0000 mg | ORAL_TABLET | Freq: Every evening | ORAL | Status: DC | PRN

## 2015-04-09 MED ORDER — POTASSIUM CHLORIDE CRYS ER 20 MEQ PO TBCR: EXTENDED_RELEASE_TABLET | ORAL | Status: DC

## 2015-04-09 MED ORDER — MONTELUKAST SODIUM 10 MG PO TABS: 10.0000 mg | ORAL_TABLET | Freq: Every day | ORAL | Status: DC

## 2015-04-09 MED ORDER — MOMETASONE FUROATE 50 MCG/ACT NA SUSP: 2.0000 | Freq: Every day | NASAL | Status: DC

## 2015-04-09 MED ORDER — VALULINE PNEUMATIC LEG WLKR LG MISC: 2.0000 [IU] | Freq: Every day | Status: DC

## 2015-04-09 MED ORDER — LINACLOTIDE 145 MCG PO CAPS: 145.0000 ug | ORAL_CAPSULE | Freq: Every day | ORAL | Status: DC

## 2015-04-09 MED ORDER — MELOXICAM 7.5 MG PO TABS: 7.5000 mg | ORAL_TABLET | Freq: Every day | ORAL | Status: DC

## 2015-04-09 MED ORDER — TRAZODONE HCL 50 MG PO TABS: 25.0000 mg | ORAL_TABLET | Freq: Every evening | ORAL | Status: DC | PRN

## 2015-04-09 MED ORDER — DIAZEPAM 5 MG PO TABS: 5.0000 mg | ORAL_TABLET | Freq: Four times a day (QID) | ORAL | Status: DC | PRN

## 2015-04-09 MED ORDER — FUROSEMIDE 20 MG PO TABS: 20.0000 mg | ORAL_TABLET | Freq: Two times a day (BID) | ORAL | Status: DC

## 2015-04-09 MED ORDER — NADOLOL 40 MG PO TABS: 40.0000 mg | ORAL_TABLET | Freq: Every day | ORAL | Status: DC

## 2015-04-09 MED ORDER — LORATADINE 10 MG PO TABS: ORAL_TABLET | ORAL | Status: AC
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
## 2015-04-09 NOTE — Addendum Note (Signed)
Addended by: Johnnette Litter A on: 04/09/2015 11:17 AM   Modules accepted: Orders

## 2015-04-09 NOTE — Addendum Note (Signed)
Addended by: Johnnette Litter A on: 04/09/2015 11:16 AM   Modules accepted: Orders, Medications

## 2015-04-10 LAB — THYROID PANEL WITH TSH
FREE THYROXINE INDEX: 3.3 (ref 1.2–4.9)
T3 Uptake Ratio: 25 % (ref 24–39)
T4, Total: 13.1 ug/dL — ABNORMAL HIGH (ref 4.5–12.0)
TSH: 2.81 u[IU]/mL (ref 0.450–4.500)

## 2015-04-18 ENCOUNTER — Encounter: Payer: Self-pay | Admitting: Pain Medicine

## 2015-04-18 ENCOUNTER — Ambulatory Visit: Payer: Medicare Other | Attending: Pain Medicine | Admitting: Pain Medicine

## 2015-04-18 VITALS — BP 118/84 | HR 96 | Temp 98.2°F | Resp 16 | Wt 258.0 lb

## 2015-04-18 DIAGNOSIS — M533 Sacrococcygeal disorders, not elsewhere classified: Secondary | ICD-10-CM | POA: Diagnosis not present

## 2015-04-18 DIAGNOSIS — M5136 Other intervertebral disc degeneration, lumbar region: Secondary | ICD-10-CM

## 2015-04-18 DIAGNOSIS — M5481 Occipital neuralgia: Secondary | ICD-10-CM | POA: Diagnosis not present

## 2015-04-18 DIAGNOSIS — M542 Cervicalgia: Secondary | ICD-10-CM | POA: Diagnosis present

## 2015-04-18 DIAGNOSIS — M549 Dorsalgia, unspecified: Secondary | ICD-10-CM | POA: Diagnosis present

## 2015-04-18 DIAGNOSIS — M5126 Other intervertebral disc displacement, lumbar region: Secondary | ICD-10-CM | POA: Insufficient documentation

## 2015-04-18 DIAGNOSIS — M47816 Spondylosis without myelopathy or radiculopathy, lumbar region: Secondary | ICD-10-CM | POA: Diagnosis not present

## 2015-04-18 DIAGNOSIS — F1910 Other psychoactive substance abuse, uncomplicated: Secondary | ICD-10-CM

## 2015-04-18 DIAGNOSIS — M5030 Other cervical disc degeneration, unspecified cervical region: Secondary | ICD-10-CM | POA: Insufficient documentation

## 2015-04-18 DIAGNOSIS — M170 Bilateral primary osteoarthritis of knee: Secondary | ICD-10-CM | POA: Diagnosis not present

## 2015-04-18 DIAGNOSIS — M5134 Other intervertebral disc degeneration, thoracic region: Secondary | ICD-10-CM

## 2015-04-18 DIAGNOSIS — R51 Headache: Secondary | ICD-10-CM | POA: Diagnosis present

## 2015-04-18 DIAGNOSIS — M961 Postlaminectomy syndrome, not elsewhere classified: Secondary | ICD-10-CM

## 2015-04-18 MED ORDER — TIZANIDINE HCL 4 MG PO TABS: ORAL_TABLET | ORAL | Status: DC

## 2015-04-18 MED ORDER — OXYCODONE HCL 15 MG PO TABS: ORAL_TABLET | ORAL | Status: DC

## 2015-04-18 NOTE — Progress Notes (Signed)
Subjective:    Patient ID: Jacqueline Campbell, female    DOB: 1960-06-16, 55 y.o.   MRN: [id number]  HPI  The patient is a 55 year old female who returns to pain management for further evaluation and treatment of pain involving the neck headache entire back upper and lower extremity region. The patient is had increasing pain occurring the back of the neck radiating to the back of the hip precipitating headaches of significant degree the patient denies any trauma change in events of daily living the call significant change in symptoms pathology. The patient is with known degenerative changes of the cervical and lumbar regions. We discussed patient's condition and will continue medications as prescribed and we'll consider patient for greater occipital nerve block to be performed at time return appointment. The patient was with understanding and agreed to suggested treatment plan. The patient continued Zanaflex and oxycodone. The patient also continued to undergo evaluation and treatment of her psychiatrist. We also receive letter from Dr. Marjory Lies regarding patient's urine drug screen which have been reviewed. We will consider additional modifications of treatment plan pending further evaluations. The patient agreed to suggested treatment plan. We will proceed with greater occipital nerve block to be performed at time return appointment as discussed. All agreed with suggested treatment plan      Review of Systems     Objective:   Physical Exam  There was tenderness to palpation of severe degree of the splenius capitis and occipitalis musculature regions. Palpation of these regions reproduced severely disabling pain. No masses of the head and neck were noted. There was limited range of motion of the cervical spine with unremarkable Spurling's maneuver. Palpation of the acromioclavicular and glenohumeral joint regions reproduces minimal discomfort. Palpation of the trapezius levator scapula and rhomboid  muscular regions reproduce moderate to moderately severe discomfort with moderate to moderately severe muscle spasms occurring in these musculature regions. Phalen's maneuver were without increase of pain of significant degree. The patient appeared to be with slightly decreased grip strength. There was no crepitus over the thoracic region noted. Palpation over the lumbar paraspinal musculatures and lumbar facet region was attends to palpation of moderate degree with lateral bending rotation extension and palpation over the lumbar facets reproducing moderate discomfort. Leg raising was tolerates approximately 20 without an increase of pain with dorsiflexion noted. DTRs were difficult to elicit patient had difficulty relaxing. There appeared to be decreased EHL strength was negative clonus negative Homans. Palpation over the PSIS and PII S region reproduces moderate discomfort with mild to moderate tenderness of the greater trochanteric region and iliotibial band region. Predominant portion of patient's pain was reproduced with palpation of the splenius capitis and occipitalis musculature region and cervical paraspinal musculature region precipitating headaches. The abdomen was nontender with no costovertebral tenderness noted    Assessment & Plan:    Degenerative disc disease lumbar spine Degenerative changes of the lumbar spine with L4-L5 degenerative changes and central disc protrusion  Degenerative joint disease of knees  Degenerative disc disease of the cervical spine C5-6 right-sided involvement most progressed with compression of the C5 nerve root  Sacroiliac joint dysfunction  Lumbar facet syndrome  Greater occipital neuralgia      PLAN   Continue present medication Zanaflex  and oxycodone  Greater occipital nerve block to be performed at time of return appointment  F/U PCP Dr.Sowles for evaluation of blood pressure and general medical condition   F/U cardiologist   Dr.Gollan .    Psych evaluation and  for continued follow-up evaluations  and treatments  as discussed  Repeat UDS today  Psych follow-up evaluation as discussed. . Patient is presently seeing a psychiatrist   F/U vascular evaluation with Dr. Lucky Cowboy as we discussed today   F/U surgical evaluation. May consider pending follow-up evaluations  F/U urological evaluation  F/U neurological evaluation.  F/U wound care clinic as discussed   May consider radiofrequency rhizolysis or intraspinal procedures pending response to present treatment and F/U evaluation . We will avoid interventional treatment at this time  [facility] to call [facility] should patient have concerns prior to scheduled return appointment

## 2015-04-18 NOTE — Progress Notes (Signed)
Safety precautions to be maintained throughout the outpatient stay will include: orient to surroundings, keep bed in low position, maintain call bell within reach at all times, provide assistance with transfer out of bed and ambulation.  

## 2015-04-18 NOTE — Patient Instructions (Addendum)
PLAN   Continue present medication Zanaflex  and oxycodone  Greater occipital nerve block to be performed at time of return appointment  F/U PCP Dr.Sowles for evaluation of blood pressure and general medical condition   F/U cardiologist   Dr.Gollan .   Psych evaluation. Please see psychiatrist this week to discuss UDS results and for continued follow-up evaluation and treatment as discussed  Repeat UDS today  Psych follow-up evaluation as discussed. Please ask receptionist the date of your follow-up psych evaluation  Need to repeat UDS today   F/U vascular evaluation with Dr. Lucky Cowboy as we discussed today F/U surgical evaluation. May consider pending follow-up evaluations  F/U urological evaluation  F/U neurological evaluation. May consider pending follow-up evaluation  F/U wound care clinic as discussed   May consider radiofrequency rhizolysis or intraspinal procedures pending response to present treatment and F/U evaluation . We will avoid interventional treatment at this time  Patient to call [facility] should patient have concerns prior to scheduled return appointment.Pain Management Discharge Instructions  General Discharge Instructions :  If you need to reach your doctor call: Monday-Friday 8:00 am - 4:00 pm at [phone] or toll free [phone].  After clinic hours [phone] to have operator reach doctor.  Bring all of your medication bottles to all your appointments in the pain clinic.  To cancel or reschedule your appointment with Pain Management please remember to call 24 hours in advance to avoid a fee.  Refer to the educational materials which you have been given on: General Risks, I had my Procedure. Discharge Instructions, Post Sedation.  Post Procedure Instructions:  The drugs you were given will stay in your system until tomorrow, so for the next 24 hours you should not drive, make any legal decisions or drink any alcoholic beverages.  You  may eat anything you prefer, but it is better to start with liquids then soups and crackers, and gradually work up to solid foods.  Please notify your doctor immediately if you have any unusual bleeding, trouble breathing or pain that is not related to your normal pain.  Depending on the type of procedure that was done, some parts of your body may feel week and/or numb.  This usually clears up by tonight or the next day.  Walk with the use of an assistive device or accompanied by an adult for the 24 hours.  You may use ice on the affected area for the first 24 hours.  Put ice in a Ziploc bag and cover with a towel and place against area 15 minutes on 15 minutes off.  You may switch to heat after 24 hours.GENERAL RISKS AND COMPLICATIONS  What are the risk, side effects and possible complications? Generally speaking, most procedures are safe.  However, with any procedure there are risks, side effects, and the possibility of complications.  The risks and complications are dependent upon the sites that are lesioned, or the type of nerve block to be performed.  The closer the procedure is to the spine, the more serious the risks are.  Great care is taken when placing the radio frequency needles, block needles or lesioning probes, but sometimes complications can occur. 1. Infection: Any time there is an injection through the skin, there is a risk of infection.  This is why sterile conditions are used for these blocks.  There are four possible types of infection. 1. Localized skin infection. 2. Central Nervous System Infection-This can be in the form of Meningitis, which can be  deadly. 3. Epidural Infections-This can be in the form of an epidural abscess, which can cause pressure inside of the spine, causing compression of the spinal cord with subsequent paralysis. This would require an emergency surgery to decompress, and there are no guarantees that the patient would recover from the  paralysis. 4. Discitis-This is an infection of the intervertebral discs.  It occurs in about 1% of discography procedures.  It is difficult to treat and it may lead to surgery.        2. Pain: the needles have to go through skin and soft tissues, will cause soreness.       3. Damage to internal structures:  The nerves to be lesioned may be near blood vessels or    other nerves which can be potentially damaged.       4. Bleeding: Bleeding is more common if the patient is taking blood thinners such as  aspirin, Coumadin, Ticiid, Plavix, etc., or if he/she have some genetic predisposition  such as hemophilia. Bleeding into the spinal canal can cause compression of the spinal  cord with subsequent paralysis.  This would require an emergency surgery to  decompress and there are no guarantees that the patient would recover from the  paralysis.       5. Pneumothorax:  Puncturing of a lung is a possibility, every time a needle is introduced in  the area of the chest or upper back.  Pneumothorax refers to free air around the  collapsed lung(s), inside of the thoracic cavity (chest cavity).  Another two possible  complications related to a similar event would include: Hemothorax and Chylothorax.   These are variations of the Pneumothorax, where instead of air around the collapsed  lung(s), you may have blood or chyle, respectively.       6. Spinal headaches: They may occur with any procedures in the area of the spine.       7. Persistent CSF (Cerebro-Spinal Fluid) leakage: This is a rare problem, but may occur  with prolonged intrathecal or epidural catheters either due to the formation of a fistulous  track or a dural tear.       8. Nerve damage: By working so close to the spinal cord, there is always a possibility of  nerve damage, which could be as serious as a permanent spinal cord injury with  paralysis.       9. Death:  Although rare, severe deadly allergic reactions known as "Anaphylactic  reaction" can  occur to any of the medications used.      10. Worsening of the symptoms:  We can always make thing worse.  What are the chances of something like this happening? Chances of any of this occuring are extremely low.  By statistics, you have more of a chance of getting killed in a motor vehicle accident: while driving to the hospital than any of the above occurring .  Nevertheless, you should be aware that they are possibilities.  In general, it is similar to taking a shower.  Everybody knows that you can slip, hit your head and get killed.  Does that mean that you should not shower again?  Nevertheless always keep in mind that statistics do not mean anything if you happen to be on the wrong side of them.  Even if a procedure has a 1 (one) in a 1,000,000 (million) chance of going wrong, it you happen to be that one..Also, keep in mind that by statistics, you have more  of a chance of having something go wrong when taking medications.  Who should not have this procedure? If you are on a blood thinning medication (e.g. Coumadin, Plavix, see list of "Blood Thinners"), or if you have an active infection going on, you should not have the procedure.  If you are taking any blood thinners, please inform your physician.  How should I prepare for this procedure?  Do not eat or drink anything at least six hours prior to the procedure.  Bring a driver with you .  It cannot be a taxi.  Come accompanied by an adult that can drive you back, and that is strong enough to help you if your legs get weak or numb from the local anesthetic.  Take all of your medicines the morning of the procedure with just enough water to swallow them.  If you have diabetes, make sure that you are scheduled to have your procedure done first thing in the morning, whenever possible.  If you have diabetes, take only half of your insulin dose and notify our nurse that you have done so as soon as you arrive at the clinic.  If you are  diabetic, but only take blood sugar pills (oral hypoglycemic), then do not take them on the morning of your procedure.  You may take them after you have had the procedure.  Do not take aspirin or any aspirin-containing medications, at least eleven (11) days prior to the procedure.  They may prolong bleeding.  Wear loose fitting clothing that may be easy to take off and that you would not mind if it got stained with Betadine or blood.  Do not wear any jewelry or perfume  Remove any nail coloring.  It will interfere with some of our monitoring equipment.  NOTE: Remember that this is not meant to be interpreted as a complete list of all possible complications.  Unforeseen problems may occur.  BLOOD THINNERS The following drugs contain aspirin or other products, which can cause increased bleeding during surgery and should not be taken for 2 weeks prior to and 1 week after surgery.  If you should need take something for relief of minor pain, you may take acetaminophen which is found in Tylenol,m Datril, Anacin-3 and Panadol. It is not blood thinner. The products listed below are.  Do not take any of the products listed below in addition to any listed on your instruction sheet.  A.P.C or A.P.C with Codeine Codeine Phosphate Capsules #3 Ibuprofen Ridaura  ABC compound Congesprin Imuran rimadil  Advil Cope Indocin Robaxisal  Alka-Seltzer Effervescent Pain Reliever and Antacid Coricidin or Coricidin-D  Indomethacin Rufen  Alka-Seltzer plus Cold Medicine Cosprin Ketoprofen S-A-C Tablets  Anacin Analgesic Tablets or Capsules Coumadin Korlgesic Salflex  Anacin Extra Strength Analgesic tablets or capsules CP-2 Tablets Lanoril Salicylate  Anaprox Cuprimine Capsules Levenox Salocol  Anexsia-D Dalteparin Magan Salsalate  Anodynos Darvon compound Magnesium Salicylate Sine-off  Ansaid Dasin Capsules Magsal Sodium Salicylate  Anturane Depen Capsules Marnal Soma  APF Arthritis pain formula Dewitt's Pills  Measurin Stanback  Argesic Dia-Gesic Meclofenamic Sulfinpyrazone  Arthritis Bayer Timed Release Aspirin Diclofenac Meclomen Sulindac  Arthritis pain formula Anacin Dicumarol Medipren Supac  Analgesic (Safety coated) Arthralgen Diffunasal Mefanamic Suprofen  Arthritis Strength Bufferin Dihydrocodeine Mepro Compound Suprol  Arthropan liquid Dopirydamole Methcarbomol with Aspirin Synalgos  ASA tablets/Enseals Disalcid Micrainin Tagament  Ascriptin Doan's Midol Talwin  Ascriptin A/D Dolene Mobidin Tanderil  Ascriptin Extra Strength Dolobid Moblgesic Ticlid  Ascriptin with Codeine Doloprin or Doloprin  with Codeine Momentum Tolectin  Asperbuf Duoprin Mono-gesic Trendar  Aspergum Duradyne Motrin or Motrin IB Triminicin  Aspirin plain, buffered or enteric coated Durasal Myochrisine Trigesic  Aspirin Suppositories Easprin Nalfon Trillsate  Aspirin with Codeine Ecotrin Regular or Extra Strength Naprosyn Uracel  Atromid-S Efficin Naproxen Ursinus  Auranofin Capsules Elmiron Neocylate Vanquish  Axotal Emagrin Norgesic Verin  Azathioprine Empirin or Empirin with Codeine Normiflo Vitamin E  Azolid Emprazil Nuprin Voltaren  Bayer Aspirin plain, buffered or children's or timed BC Tablets or powders Encaprin Orgaran Warfarin Sodium  Buff-a-Comp Enoxaparin Orudis Zorpin  Buff-a-Comp with Codeine Equegesic Os-Cal-Gesic   Buffaprin Excedrin plain, buffered or Extra Strength Oxalid   Bufferin Arthritis Strength Feldene Oxphenbutazone   Bufferin plain or Extra Strength Feldene Capsules Oxycodone with Aspirin   Bufferin with Codeine Fenoprofen Fenoprofen Pabalate or Pabalate-SF   Buffets II Flogesic Panagesic   Buffinol plain or Extra Strength Florinal or Florinal with Codeine Panwarfarin   Buf-Tabs Flurbiprofen Penicillamine   Butalbital Compound Four-way cold tablets Penicillin   Butazolidin Fragmin Pepto-Bismol   Carbenicillin Geminisyn Percodan   Carna Arthritis Reliever Geopen Persantine    Carprofen Gold's salt Persistin   Chloramphenicol Goody's Phenylbutazone   Chloromycetin Haltrain Piroxlcam   Clmetidine heparin Plaquenil   Cllnoril Hyco-pap Ponstel   Clofibrate Hydroxy chloroquine Propoxyphen         Before stopping any of these medications, be sure to consult the physician who ordered them.  Some, such as Coumadin (Warfarin) are ordered to prevent or treat serious conditions such as "deep thrombosis", "pumonary embolisms", and other heart problems.  The amount of time that you may need off of the medication may also vary with the medication and the reason for which you were taking it.  If you are taking any of these medications, please make sure you notify your pain physician before you undergo any procedures.         Occipital Nerve Block Patient Information  Description: The occipital nerves originate in the cervical (neck) spinal cord and travel upward through muscle and tissue to supply sensation to the back of the head and top of the scalp.  In addition, the nerves control some of the muscles of the scalp.  Occipital neuralgia is an irritation of these nerves which can cause headaches, numbness of the scalp, and neck discomfort.     The occipital nerve block will interrupt nerve transmission through these nerves and can relieve pain and spasm.  The block consists of insertion of a small needle under the skin in the back of the head to deposit local anesthetic (numbing medicine) and/or steroids around the nerve.  The entire block usually lasts less than 5 minutes.  Conditions which may be treated by occipital blocks:   Muscular pain and spasm of the scalp  Nerve irritation, back of the head  Headaches  Upper neck pain  Preparation for the injection:  12. Do not eat any solid food or dairy products within 6 hours of your appointment. 13. You may drink clear liquids up to 2 hours before appointment.  Clear liquids include water, black coffee, juice or  soda.  No milk or cream please. 14. You may take your regular medication, including pain medications, with a sip of water before you appointment.  Diabetics should hold regular insulin (if taken separately) and take 1/2 normal NPH dose the morning of the procedure.  Carry some sugar containing items with you to your appointment. 15. A driver must accompany you and be  prepared to drive you home after your procedure. 28. Bring all your current medications with you. 17. An IV may be inserted and sedation may be given at the discretion of the physician. 18. A blood pressure cuff, EKG, and other monitors will often be applied during the procedure.  Some patients may need to have extra oxygen administered for a short period. 73. You will be asked to provide medical information, including your allergies and medications, prior to the procedure.  We must know immediately if you are taking blood thinners (like Coumadin/Warfarin) or if you are allergic to IV iodine contrast (dye).  We must know if you could possible be pregnant.  20. Do not wear a high collared shirt or turtleneck.  Tie long hair up in the back if possible.  Possible side-effects:   Bleeding from needle site  Infection (rare, may require surgery)  Nerve injury (rare)  Hair on back of neck can be tinged with iodine scrub (this will wash out)  Light-headedness (temporary)  Pain at injection site (several days)  Decreased blood pressure (rare, temporary)  Seizure (very rare)  Call if you experience:   Hives or difficulty breathing ( go to the emergency room)  Inflammation or drainage at the injection site(s)  Please note:  Although the local anesthetic injected can often make your painful muscles or headache feel good for several hours after the injection, the pain may return.  It takes 3-7 days for steroids to work.  You may not notice any pain relief for at least one week.  If effective, we will often do a series of  injections spaced 3-6 weeks apart to maximally decrease your pain.  If you have any questions, please call [phone] [place]  What are the risk, side effects and possible complications? Generally speaking, most procedures are safe.  However, with any procedure there are risks, side effects, and the possibility of complications.  The risks and complications are dependent upon the sites that are lesioned, or the type of nerve block to be performed.  The closer the procedure is to the spine, the more serious the risks are.  Great care is taken when placing the radio frequency needles, block needles or lesioning probes, but sometimes complications can occur. 1. Infection: Any time there is an injection through the skin, there is a risk of infection.  This is why sterile conditions are used for these blocks.  There are four possible types of infection. 1. Localized skin infection. 2. Central Nervous System Infection-This can be in the form of Meningitis, which can be deadly. 3. Epidural Infections-This can be in the form of an epidural abscess, which can cause pressure inside of the spine, causing compression of the spinal cord with subsequent paralysis. This would require an emergency surgery to decompress, and there are no guarantees that the patient would recover from the paralysis. 4. Discitis-This is an infection of the intervertebral discs.  It occurs in about 1% of discography procedures.  It is difficult to treat and it may lead to surgery.        2. Pain: the needles have to go through skin and soft tissues, will cause soreness.       3. Damage to internal structures:  The nerves to be lesioned may be near blood vessels or    other nerves which can be potentially damaged.       4. Bleeding: Bleeding is more common if  the patient is taking blood thinners such as  aspirin, Coumadin, Ticiid, Plavix, etc., or if he/she have  some genetic predisposition  such as hemophilia. Bleeding into the spinal canal can cause compression of the spinal  cord with subsequent paralysis.  This would require an emergency surgery to  decompress and there are no guarantees that the patient would recover from the  paralysis.       5. Pneumothorax:  Puncturing of a lung is a possibility, every time a needle is introduced in  the area of the chest or upper back.  Pneumothorax refers to free air around the  collapsed lung(s), inside of the thoracic cavity (chest cavity).  Another two possible  complications related to a similar event would include: Hemothorax and Chylothorax.   These are variations of the Pneumothorax, where instead of air around the collapsed  lung(s), you may have blood or chyle, respectively.       6. Spinal headaches: They may occur with any procedures in the area of the spine.       7. Persistent CSF (Cerebro-Spinal Fluid) leakage: This is a rare problem, but may occur  with prolonged intrathecal or epidural catheters either due to the formation of a fistulous  track or a dural tear.       8. Nerve damage: By working so close to the spinal cord, there is always a possibility of  nerve damage, which could be as serious as a permanent spinal cord injury with  paralysis.       9. Death:  Although rare, severe deadly allergic reactions known as "Anaphylactic  reaction" can occur to any of the medications used.      10. Worsening of the symptoms:  We can always make thing worse.  What are the chances of something like this happening? Chances of any of this occuring are extremely low.  By statistics, you have more of a chance of getting killed in a motor vehicle accident: while driving to the hospital than any of the above occurring .  Nevertheless, you should be aware that they are possibilities.  In general, it is similar to taking a shower.  Everybody knows that you can slip, hit your head and get killed.  Does that mean that you  should not shower again?  Nevertheless always keep in mind that statistics do not mean anything if you happen to be on the wrong side of them.  Even if a procedure has a 1 (one) in a 1,000,000 (million) chance of going wrong, it you happen to be that one..Also, keep in mind that by statistics, you have more of a chance of having something go wrong when taking medications.  Who should not have this procedure? If you are on a blood thinning medication (e.g. Coumadin, Plavix, see list of "Blood Thinners"), or if you have an active infection going on, you should not have the procedure.  If you are taking any blood thinners, please inform your physician.  How should I prepare for this procedure?  Do not eat or drink anything at least six hours prior to the procedure.  Bring a driver with you .  It cannot be a taxi.  Come accompanied by an adult that can drive you back, and that is strong enough to help you if your legs get weak or numb from the local anesthetic.  Take all of your medicines the morning of the procedure with just enough water to swallow them.  If you have  diabetes, make sure that you are scheduled to have your procedure done first thing in the morning, whenever possible.  If you have diabetes, take only half of your insulin dose and notify our nurse that you have done so as soon as you arrive at the clinic.  If you are diabetic, but only take blood sugar pills (oral hypoglycemic), then do not take them on the morning of your procedure.  You may take them after you have had the procedure.  Do not take aspirin or any aspirin-containing medications, at least eleven (11) days prior to the procedure.  They may prolong bleeding.  Wear loose fitting clothing that may be easy to take off and that you would not mind if it got stained with Betadine or blood.  Do not wear any jewelry or perfume  Remove any nail coloring.  It will interfere with some of our monitoring equipment.  NOTE:  Remember that this is not meant to be interpreted as a complete list of all possible complications.  Unforeseen problems may occur.  BLOOD THINNERS The following drugs contain aspirin or other products, which can cause increased bleeding during surgery and should not be taken for 2 weeks prior to and 1 week after surgery.  If you should need take something for relief of minor pain, you may take acetaminophen which is found in Tylenol,m Datril, Anacin-3 and Panadol. It is not blood thinner. The products listed below are.  Do not take any of the products listed below in addition to any listed on your instruction sheet.  A.P.C or A.P.C with Codeine Codeine Phosphate Capsules #3 Ibuprofen Ridaura  ABC compound Congesprin Imuran rimadil  Advil Cope Indocin Robaxisal  Alka-Seltzer Effervescent Pain Reliever and Antacid Coricidin or Coricidin-D  Indomethacin Rufen  Alka-Seltzer plus Cold Medicine Cosprin Ketoprofen S-A-C Tablets  Anacin Analgesic Tablets or Capsules Coumadin Korlgesic Salflex  Anacin Extra Strength Analgesic tablets or capsules CP-2 Tablets Lanoril Salicylate  Anaprox Cuprimine Capsules Levenox Salocol  Anexsia-D Dalteparin Magan Salsalate  Anodynos Darvon compound Magnesium Salicylate Sine-off  Ansaid Dasin Capsules Magsal Sodium Salicylate  Anturane Depen Capsules Marnal Soma  APF Arthritis pain formula Dewitt's Pills Measurin Stanback  Argesic Dia-Gesic Meclofenamic Sulfinpyrazone  Arthritis Bayer Timed Release Aspirin Diclofenac Meclomen Sulindac  Arthritis pain formula Anacin Dicumarol Medipren Supac  Analgesic (Safety coated) Arthralgen Diffunasal Mefanamic Suprofen  Arthritis Strength Bufferin Dihydrocodeine Mepro Compound Suprol  Arthropan liquid Dopirydamole Methcarbomol with Aspirin Synalgos  ASA tablets/Enseals Disalcid Micrainin Tagament  Ascriptin Doan's Midol Talwin  Ascriptin A/D Dolene Mobidin Tanderil  Ascriptin Extra Strength Dolobid Moblgesic Ticlid   Ascriptin with Codeine Doloprin or Doloprin with Codeine Momentum Tolectin  Asperbuf Duoprin Mono-gesic Trendar  Aspergum Duradyne Motrin or Motrin IB Triminicin  Aspirin plain, buffered or enteric coated Durasal Myochrisine Trigesic  Aspirin Suppositories Easprin Nalfon Trillsate  Aspirin with Codeine Ecotrin Regular or Extra Strength Naprosyn Uracel  Atromid-S Efficin Naproxen Ursinus  Auranofin Capsules Elmiron Neocylate Vanquish  Axotal Emagrin Norgesic Verin  Azathioprine Empirin or Empirin with Codeine Normiflo Vitamin E  Azolid Emprazil Nuprin Voltaren  Bayer Aspirin plain, buffered or children's or timed BC Tablets or powders Encaprin Orgaran Warfarin Sodium  Buff-a-Comp Enoxaparin Orudis Zorpin  Buff-a-Comp with Codeine Equegesic Os-Cal-Gesic   Buffaprin Excedrin plain, buffered or Extra Strength Oxalid   Bufferin Arthritis Strength Feldene Oxphenbutazone   Bufferin plain or Extra Strength Feldene Capsules Oxycodone with Aspirin   Bufferin with Codeine Fenoprofen Fenoprofen Pabalate or Pabalate-SF   Buffets II Flogesic Panagesic  Buffinol plain or Extra Strength Florinal or Florinal with Codeine Panwarfarin   Buf-Tabs Flurbiprofen Penicillamine   Butalbital Compound Four-way cold tablets Penicillin   Butazolidin Fragmin Pepto-Bismol   Carbenicillin Geminisyn Percodan   Carna Arthritis Reliever Geopen Persantine   Carprofen Gold's salt Persistin   Chloramphenicol Goody's Phenylbutazone   Chloromycetin Haltrain Piroxlcam   Clmetidine heparin Plaquenil   Cllnoril Hyco-pap Ponstel   Clofibrate Hydroxy chloroquine Propoxyphen         Before stopping any of these medications, be sure to consult the physician who ordered them.  Some, such as Coumadin (Warfarin) are ordered to prevent or treat serious conditions such as "deep thrombosis", "pumonary embolisms", and other heart problems.  The amount of time that you may need off of the medication may also vary with the medication  and the reason for which you were taking it.  If you are taking any of these medications, please make sure you notify your pain physician before you undergo any procedures.

## 2015-04-22 ENCOUNTER — Other Ambulatory Visit: Payer: Self-pay | Admitting: Pain Medicine

## 2015-05-01 ENCOUNTER — Ambulatory Visit: Payer: Medicare Other | Attending: Pain Medicine | Admitting: Pain Medicine

## 2015-05-01 ENCOUNTER — Encounter: Payer: Self-pay | Admitting: Pain Medicine

## 2015-05-01 VITALS — BP 128/80 | HR 79 | Temp 97.5°F | Resp 16 | Ht 67.0 in | Wt 257.0 lb

## 2015-05-01 DIAGNOSIS — M170 Bilateral primary osteoarthritis of knee: Secondary | ICD-10-CM

## 2015-05-01 DIAGNOSIS — R51 Headache: Secondary | ICD-10-CM | POA: Diagnosis present

## 2015-05-01 DIAGNOSIS — M5030 Other cervical disc degeneration, unspecified cervical region: Secondary | ICD-10-CM | POA: Diagnosis not present

## 2015-05-01 DIAGNOSIS — F1910 Other psychoactive substance abuse, uncomplicated: Secondary | ICD-10-CM

## 2015-05-01 DIAGNOSIS — M961 Postlaminectomy syndrome, not elsewhere classified: Secondary | ICD-10-CM

## 2015-05-01 DIAGNOSIS — M533 Sacrococcygeal disorders, not elsewhere classified: Secondary | ICD-10-CM

## 2015-05-01 DIAGNOSIS — M542 Cervicalgia: Secondary | ICD-10-CM | POA: Diagnosis present

## 2015-05-01 DIAGNOSIS — M5134 Other intervertebral disc degeneration, thoracic region: Secondary | ICD-10-CM

## 2015-05-01 DIAGNOSIS — M5481 Occipital neuralgia: Secondary | ICD-10-CM

## 2015-05-01 DIAGNOSIS — M5136 Other intervertebral disc degeneration, lumbar region: Secondary | ICD-10-CM

## 2015-05-01 MED ORDER — ORPHENADRINE CITRATE 30 MG/ML IJ SOLN
INTRAMUSCULAR | Status: AC
  Filled 2015-05-01: qty 2

## 2015-05-01 MED ORDER — MIDAZOLAM HCL 5 MG/5ML IJ SOLN
INTRAMUSCULAR | Status: AC
  Administered 2015-05-01: 4 mg
  Filled 2015-05-01: qty 5

## 2015-05-01 MED ORDER — LACTATED RINGERS IV SOLN: 1000.0000 mL | INTRAVENOUS | Status: DC

## 2015-05-01 MED ORDER — FENTANYL CITRATE (PF) 100 MCG/2ML IJ SOLN: 100.0000 ug | Freq: Once | INTRAMUSCULAR | Status: DC

## 2015-05-01 MED ORDER — FENTANYL CITRATE (PF) 100 MCG/2ML IJ SOLN
INTRAMUSCULAR | Status: AC
  Administered 2015-05-01: 100 ug via INTRAVENOUS
  Filled 2015-05-01: qty 2

## 2015-05-01 MED ORDER — MIDAZOLAM HCL 5 MG/5ML IJ SOLN: 5.0000 mg | Freq: Once | INTRAMUSCULAR | Status: DC

## 2015-05-01 MED ORDER — BUPIVACAINE HCL (PF) 0.25 % IJ SOLN: 30.0000 mL | Freq: Once | INTRAMUSCULAR | Status: DC

## 2015-05-01 MED ORDER — ORPHENADRINE CITRATE 30 MG/ML IJ SOLN: 60.0000 mg | Freq: Once | INTRAMUSCULAR | Status: AC

## 2015-05-01 MED ORDER — TRIAMCINOLONE ACETONIDE 40 MG/ML IJ SUSP: 40.0000 mg | Freq: Once | INTRAMUSCULAR | Status: DC

## 2015-05-01 MED ORDER — TRIAMCINOLONE ACETONIDE 40 MG/ML IJ SUSP
INTRAMUSCULAR | Status: AC
  Administered 2015-05-01: 09:00:00
  Filled 2015-05-01: qty 1

## 2015-05-01 MED ORDER — BUPIVACAINE HCL (PF) 0.25 % IJ SOLN
INTRAMUSCULAR | Status: AC
  Administered 2015-05-01: 09:00:00
  Filled 2015-05-01: qty 30

## 2015-05-01 NOTE — Patient Instructions (Addendum)
PLAN   Continue present medication Zanaflex  and oxycodone  F/U PCP Dr.Sowles for evaluation of blood pressure and general medical condition   F/U cardiologist   Dr.Gollan .   Psych evaluation. Please see psychiatrist this week to discuss UDS results and for continued follow-up evaluation and treatment as discussed  Psych follow-up evaluation as discussed.    F/U vascular evaluation with Dr. Lucky Cowboy   F/U surgical evaluation as discussed  F/U urological evaluation as discussed F/U neurological evaluation. May consider pending follow-up evaluation  F/U wound care clinic as discussed   May consider radiofrequency rhizolysis or intraspinal procedures pending response to present treatment and F/U evaluation . We will avoid interventional treatment at this time  Patient to call Pain Management Center should patient have concerns prior to scheduled return appointment.GENERAL RISKS AND COMPLICATIONS  What are the risk, side effects and possible complications? Generally speaking, most procedures are safe.  However, with any procedure there are risks, side effects, and the possibility of complications.  The risks and complications are dependent upon the sites that are lesioned, or the type of nerve block to be performed.  The closer the procedure is to the spine, the more serious the risks are.  Great care is taken when placing the radio frequency needles, block needles or lesioning probes, but sometimes complications can occur. 1. Infection: Any time there is an injection through the skin, there is a risk of infection.  This is why sterile conditions are used for these blocks.  There are four possible types of infection. 1. Localized skin infection. 2. Central Nervous System Infection-This can be in the form of Meningitis, which can be deadly. 3. Epidural Infections-This can be in the form of an epidural abscess, which can cause pressure inside of the spine, causing compression of the spinal cord  with subsequent paralysis. This would require an emergency surgery to decompress, and there are no guarantees that the patient would recover from the paralysis. 4. Discitis-This is an infection of the intervertebral discs.  It occurs in about 1% of discography procedures.  It is difficult to treat and it may lead to surgery.        2. Pain: the needles have to go through skin and soft tissues, will cause soreness.       3. Damage to internal structures:  The nerves to be lesioned may be near blood vessels or    other nerves which can be potentially damaged.       4. Bleeding: Bleeding is more common if the patient is taking blood thinners such as  aspirin, Coumadin, Ticiid, Plavix, etc., or if he/she have some genetic predisposition  such as hemophilia. Bleeding into the spinal canal can cause compression of the spinal  cord with subsequent paralysis.  This would require an emergency surgery to  decompress and there are no guarantees that the patient would recover from the  paralysis.       5. Pneumothorax:  Puncturing of a lung is a possibility, every time a needle is introduced in  the area of the chest or upper back.  Pneumothorax refers to free air around the  collapsed lung(s), inside of the thoracic cavity (chest cavity).  Another two possible  complications related to a similar event would include: Hemothorax and Chylothorax.   These are variations of the Pneumothorax, where instead of air around the collapsed  lung(s), you may have blood or chyle, respectively.       6. Spinal headaches: They  may occur with any procedures in the area of the spine.       7. Persistent CSF (Cerebro-Spinal Fluid) leakage: This is a rare problem, but may occur  with prolonged intrathecal or epidural catheters either due to the formation of a fistulous  track or a dural tear.       8. Nerve damage: By working so close to the spinal cord, there is always a possibility of  nerve damage, which could be as serious as a  permanent spinal cord injury with  paralysis.       9. Death:  Although rare, severe deadly allergic reactions known as "Anaphylactic  reaction" can occur to any of the medications used.      10. Worsening of the symptoms:  We can always make thing worse.  What are the chances of something like this happening? Chances of any of this occuring are extremely low.  By statistics, you have more of a chance of getting killed in a motor vehicle accident: while driving to the hospital than any of the above occurring .  Nevertheless, you should be aware that they are possibilities.  In general, it is similar to taking a shower.  Everybody knows that you can slip, hit your head and get killed.  Does that mean that you should not shower again?  Nevertheless always keep in mind that statistics do not mean anything if you happen to be on the wrong side of them.  Even if a procedure has a 1 (one) in a 1,000,000 (million) chance of going wrong, it you happen to be that one..Also, keep in mind that by statistics, you have more of a chance of having something go wrong when taking medications.  Who should not have this procedure? If you are on a blood thinning medication (e.g. Coumadin, Plavix, see list of "Blood Thinners"), or if you have an active infection going on, you should not have the procedure.  If you are taking any blood thinners, please inform your physician.  How should I prepare for this procedure?  Do not eat or drink anything at least six hours prior to the procedure.  Bring a driver with you .  It cannot be a taxi.  Come accompanied by an adult that can drive you back, and that is strong enough to help you if your legs get weak or numb from the local anesthetic.  Take all of your medicines the morning of the procedure with just enough water to swallow them.  If you have diabetes, make sure that you are scheduled to have your procedure done first thing in the morning, whenever possible.  If you  have diabetes, take only half of your insulin dose and notify our nurse that you have done so as soon as you arrive at the clinic.  If you are diabetic, but only take blood sugar pills (oral hypoglycemic), then do not take them on the morning of your procedure.  You may take them after you have had the procedure.  Do not take aspirin or any aspirin-containing medications, at least eleven (11) days prior to the procedure.  They may prolong bleeding.  Wear loose fitting clothing that may be easy to take off and that you would not mind if it got stained with Betadine or blood.  Do not wear any jewelry or perfume  Remove any nail coloring.  It will interfere with some of our monitoring equipment.  NOTE: Remember that this is not meant to be  interpreted as a complete list of all possible complications.  Unforeseen problems may occur.  BLOOD THINNERS The following drugs contain aspirin or other products, which can cause increased bleeding during surgery and should not be taken for 2 weeks prior to and 1 week after surgery.  If you should need take something for relief of minor pain, you may take acetaminophen which is found in Tylenol,m Datril, Anacin-3 and Panadol. It is not blood thinner. The products listed below are.  Do not take any of the products listed below in addition to any listed on your instruction sheet.  A.P.C or A.P.C with Codeine Codeine Phosphate Capsules #3 Ibuprofen Ridaura  ABC compound Congesprin Imuran rimadil  Advil Cope Indocin Robaxisal  Alka-Seltzer Effervescent Pain Reliever and Antacid Coricidin or Coricidin-D  Indomethacin Rufen  Alka-Seltzer plus Cold Medicine Cosprin Ketoprofen S-A-C Tablets  Anacin Analgesic Tablets or Capsules Coumadin Korlgesic Salflex  Anacin Extra Strength Analgesic tablets or capsules CP-2 Tablets Lanoril Salicylate  Anaprox Cuprimine Capsules Levenox Salocol  Anexsia-D Dalteparin Magan Salsalate  Anodynos Darvon compound Magnesium  Salicylate Sine-off  Ansaid Dasin Capsules Magsal Sodium Salicylate  Anturane Depen Capsules Marnal Soma  APF Arthritis pain formula Dewitt's Pills Measurin Stanback  Argesic Dia-Gesic Meclofenamic Sulfinpyrazone  Arthritis Bayer Timed Release Aspirin Diclofenac Meclomen Sulindac  Arthritis pain formula Anacin Dicumarol Medipren Supac  Analgesic (Safety coated) Arthralgen Diffunasal Mefanamic Suprofen  Arthritis Strength Bufferin Dihydrocodeine Mepro Compound Suprol  Arthropan liquid Dopirydamole Methcarbomol with Aspirin Synalgos  ASA tablets/Enseals Disalcid Micrainin Tagament  Ascriptin Doan's Midol Talwin  Ascriptin A/D Dolene Mobidin Tanderil  Ascriptin Extra Strength Dolobid Moblgesic Ticlid  Ascriptin with Codeine Doloprin or Doloprin with Codeine Momentum Tolectin  Asperbuf Duoprin Mono-gesic Trendar  Aspergum Duradyne Motrin or Motrin IB Triminicin  Aspirin plain, buffered or enteric coated Durasal Myochrisine Trigesic  Aspirin Suppositories Easprin Nalfon Trillsate  Aspirin with Codeine Ecotrin Regular or Extra Strength Naprosyn Uracel  Atromid-S Efficin Naproxen Ursinus  Auranofin Capsules Elmiron Neocylate Vanquish  Axotal Emagrin Norgesic Verin  Azathioprine Empirin or Empirin with Codeine Normiflo Vitamin E  Azolid Emprazil Nuprin Voltaren  Bayer Aspirin plain, buffered or children's or timed BC Tablets or powders Encaprin Orgaran Warfarin Sodium  Buff-a-Comp Enoxaparin Orudis Zorpin  Buff-a-Comp with Codeine Equegesic Os-Cal-Gesic   Buffaprin Excedrin plain, buffered or Extra Strength Oxalid   Bufferin Arthritis Strength Feldene Oxphenbutazone   Bufferin plain or Extra Strength Feldene Capsules Oxycodone with Aspirin   Bufferin with Codeine Fenoprofen Fenoprofen Pabalate or Pabalate-SF   Buffets II Flogesic Panagesic   Buffinol plain or Extra Strength Florinal or Florinal with Codeine Panwarfarin   Buf-Tabs Flurbiprofen Penicillamine   Butalbital Compound Four-way  cold tablets Penicillin   Butazolidin Fragmin Pepto-Bismol   Carbenicillin Geminisyn Percodan   Carna Arthritis Reliever Geopen Persantine   Carprofen Gold's salt Persistin   Chloramphenicol Goody's Phenylbutazone   Chloromycetin Haltrain Piroxlcam   Clmetidine heparin Plaquenil   Cllnoril Hyco-pap Ponstel   Clofibrate Hydroxy chloroquine Propoxyphen         Before stopping any of these medications, be sure to consult the physician who ordered them.  Some, such as Coumadin (Warfarin) are ordered to prevent or treat serious conditions such as "deep thrombosis", "pumonary embolisms", and other heart problems.  The amount of time that you may need off of the medication may also vary with the medication and the reason for which you were taking it.  If you are taking any of these medications, please make sure you notify your pain  physician before you undergo any procedures.

## 2015-05-01 NOTE — Progress Notes (Signed)
   Subjective:    Patient ID: Jacqueline Campbell, female    DOB: 05-Dec-1960, 55 y.o.   MRN: [id number]  HPI  NOTE: The patient is a 55 y.o.-year-old female who returns to the [facility] for further evaluation and treatment of pain consisting of pain involving the region of the neck and headache.  Patient is with prior studies revealing patient to be with degenerative disc disease of the cervical spine C5-6 right-sided involvement most progressed with compression of the C5 nerve root . There is concern regarding significant component of patient's headaches been due to greater occipital neuralgia .  The risks, benefits, and expectations of the procedure have been discussed and explained to patient, who is understanding and wishes to proceed with interventional treatment as discussed and as explained to patient.  Will proceed with greater occipital nerve blocks with myoneural block injections at this time as discussed and as explained to patient.  All are understanding and in agreement with suggested treatment plan.    PROCEDURE:  Greater occipital nerve block on the left side with IV Versed, IV Fentanyl, conscious sedation, EKG, blood pressure, pulse, pulse oximetry monitoring.  Procedure performed with patient in prone position.  Greater occipital nerve block on the left side.   With patient in prone position, Betadine prep of proposed entry site accomplished.  Following identification of the nuchal ridge, 22 -gauge needle was inserted at the level of the nuchal ridge medial to the occipital artery.  Following negative aspiration, 4cc 0.25% bupivacaine with Kenalog injected for left greater occipital nerve block.  Needle was removed.  Patient tolerated injection well.   Greater occipital nerve block on the rightt side. The greater occipital nerve block on the right side was performed exactly as the left greater occipital nerve block was performed and utilizing the same technique.  Myoneural  block injections of the cervical region. Following Betadine prep of proposed entry site a 22-gauge needle was inserted in the cervical paraspinal muscular region and following negative aspiration 2 cc of 0.25% bupivacaine with Norflex was injected for myoneural block injections of the paraspinal cervical musculature region 4  The patient tolerated the procedure well   A total of 10 mg Kenalog was utilized for the entire procedure.  PLAN:    1. Medications: Will continue presently prescribed medications Zanaflex and oxycodone at this time. 2. Patient to follow up with primary care physician Dr.Sowles  for evaluation of blood pressure and general medical condition status post procedure performed on today's visit. 3. Neurological evaluation for further assessment of headaches for further studies as discussed. 4. Surgical evaluation as discussed.  5. Patient may be candidate for Botox injections, radiofrequency procedures, as well as implantation type procedures pending response to treatment rendered on today's visit and pending follow-up evaluation. 6. Patient has been advised to adhere to proper body mechanics and to avoid activities which appear to aggravate condition.cations:  Will continue presently prescribed medications at this time. 7. The patient is understanding and in agreement with the suggested treatment plan.   Review of Systems     Objective:   Physical Exam        Assessment & Plan:

## 2015-05-01 NOTE — Progress Notes (Signed)
Safety precautions to be maintained throughout the outpatient stay will include: orient to surroundings, keep bed in low position, maintain call bell within reach at all times, provide assistance with transfer out of bed and ambulation.  

## 2015-05-15 ENCOUNTER — Other Ambulatory Visit: Payer: Self-pay | Admitting: Pain Medicine

## 2015-05-16 ENCOUNTER — Encounter: Payer: Medicare Other | Admitting: Pain Medicine

## 2015-05-28 ENCOUNTER — Encounter: Payer: Self-pay | Admitting: Pain Medicine

## 2015-05-28 ENCOUNTER — Ambulatory Visit: Payer: Medicare Other | Attending: Pain Medicine | Admitting: Pain Medicine

## 2015-05-28 VITALS — BP 124/80 | HR 88 | Temp 97.6°F | Resp 15 | Ht 67.0 in | Wt 250.0 lb

## 2015-05-28 DIAGNOSIS — M5136 Other intervertebral disc degeneration, lumbar region: Secondary | ICD-10-CM | POA: Diagnosis not present

## 2015-05-28 DIAGNOSIS — R51 Headache: Secondary | ICD-10-CM | POA: Insufficient documentation

## 2015-05-28 DIAGNOSIS — M533 Sacrococcygeal disorders, not elsewhere classified: Secondary | ICD-10-CM

## 2015-05-28 DIAGNOSIS — M170 Bilateral primary osteoarthritis of knee: Secondary | ICD-10-CM | POA: Insufficient documentation

## 2015-05-28 DIAGNOSIS — M5134 Other intervertebral disc degeneration, thoracic region: Secondary | ICD-10-CM

## 2015-05-28 DIAGNOSIS — M961 Postlaminectomy syndrome, not elsewhere classified: Secondary | ICD-10-CM

## 2015-05-28 DIAGNOSIS — M5481 Occipital neuralgia: Secondary | ICD-10-CM | POA: Insufficient documentation

## 2015-05-28 DIAGNOSIS — M47816 Spondylosis without myelopathy or radiculopathy, lumbar region: Secondary | ICD-10-CM | POA: Insufficient documentation

## 2015-05-28 DIAGNOSIS — M5126 Other intervertebral disc displacement, lumbar region: Secondary | ICD-10-CM | POA: Diagnosis not present

## 2015-05-28 DIAGNOSIS — M5030 Other cervical disc degeneration, unspecified cervical region: Secondary | ICD-10-CM | POA: Diagnosis not present

## 2015-05-28 MED ORDER — OXYCODONE HCL 15 MG PO TABS: ORAL_TABLET | ORAL | Status: DC

## 2015-05-28 NOTE — Progress Notes (Signed)
Safety precautions to be maintained throughout the outpatient stay will include: orient to surroundings, keep bed in low position, maintain call bell within reach at all times, provide assistance with transfer out of bed and ambulation.  

## 2015-05-28 NOTE — Progress Notes (Signed)
   Subjective:    Patient ID: Jacqueline Campbell, female    DOB: 11-22-1960, 55 y.o.   MRN: [id number]  HPI The patient is a 55 year old female who returns to pain management for further evaluation and treatment of pain including headaches as well as pain involving the neck entire back upper and lower extremity regions the patient states the pain involves the lower back and lower extremity region a moderate degree. The patient is with pain occurring in the region of the neck radiating to the back of the head causing headaches as well. The patient is without trauma. The patient has undergone surgery of the abdominopelvic region. We will avoid interventional treatment and will continue Zanaflex and oxycodone as prescribed at this time. The patient is with understanding and agreed to suggested treatment plan.   .   Review of Systems     Objective:   Physical Exam  There was tends to palpation of the splenius capitis and occipitalis musculature regions. Palpation of these regions reproduces moderate discomfort. There was moderate tenderness of the cervical facet cervical paraspinal musculature regions. Palpation of the acromioclavicular and glenohumeral joint regions reproduce mild to moderate discomfort. Palpation over the region of the thoracic region was attends to palpation with no crepitus of the thoracic region noted. The patient appeared to be with bilaterally equal grip strength without increased pain of significant degree with Tinel and Phalen's maneuver. Palpation over the lumbar paraspinal must reason lumbar facet region was attends to palpation of moderate degree with lateral bending rotation extension and palpation of the lumbar facets reproducing moderate discomfort. There was tenderness over the PSIS and PII S region a moderate degree. There was mild to moderate tenderness over the greater trochanteric region and iliotibial band region. There was straight leg raising tolerate to 20 without  increase of pain with dorsiflexion noted. There was negative clonus negative Homans. DTRs were difficult to elicit. Patient had difficulty relaxing. EHL strength appeared to be decreased. The knees were with tenderness to palpation of mild to moderate degree. Crepitus of the knees were noted. There was negative anterior and posterior drawer signs without ballottement of the patella. There was negative clonus negative Homans. 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   Psych follow-up evaluation as discussed.    F/U vascular evaluation with Dr. Lucky Cowboy as discussed  F/U surgical evaluation as discussed  F/U urological evaluation as discussed   F/U neurological evaluation. May consider pending follow-up evaluation  F/U wound care clinic as discussed   May consider radiofrequency rhizolysis or intraspinal procedures pending response to present treatment and F/U evaluation . We will avoid interventional treatment at this time  Patient to call Pain Management Center should patient have concerns prior to scheduled return appointment.

## 2015-05-28 NOTE — Patient Instructions (Addendum)
PLAN   Continue present medication Zanaflex  and oxycodone  F/U PCP Dr.Sowles for evaluation of blood pressure and general medical condition   F/U cardiologist   Dr.Gollan .   Psych follow-up evaluation as discussed.    F/U vascular evaluation with Dr. Lucky Cowboy as discussed  F/U surgical evaluation as discussed  F/U urological evaluation as discussed   F/U neurological evaluation. May consider pending follow-up evaluation  F/U wound care clinic as discussed   May consider radiofrequency rhizolysis or intraspinal procedures pending response to present treatment and F/U evaluation . We will avoid interventional treatment at this time  Patient to call Pain Management Center should patient have concerns prior to scheduled return appointment.Pain Management Discharge Instructions  General Discharge Instructions :  If you need to reach your doctor call: Monday-Friday 8:00 am - 4:00 pm at [phone] or toll free [phone].  After clinic hours [phone] to have operator reach doctor.  Bring all of your medication bottles to all your appointments in the pain clinic.  To cancel or reschedule your appointment with Pain Management please remember to call 24 hours in advance to avoid a fee.  Refer to the educational materials which you have been given on: General Risks, I had my Procedure. Discharge Instructions, Post Sedation.  Post Procedure Instructions:  The drugs you were given will stay in your system until tomorrow, so for the next 24 hours you should not drive, make any legal decisions or drink any alcoholic beverages.  You may eat anything you prefer, but it is better to start with liquids then soups and crackers, and gradually work up to solid foods.  Please notify your doctor immediately if you have any unusual bleeding, trouble breathing or pain that is not related to your normal pain.  Depending on the type of procedure that was done, some parts of your body may feel  week and/or numb.  This usually clears up by tonight or the next day.  Walk with the use of an assistive device or accompanied by an adult for the 24 hours.  You may use ice on the affected area for the first 24 hours.  Put ice in a Ziploc bag and cover with a towel and place against area 15 minutes on 15 minutes off.  You may switch to heat after 24 hours.

## 2015-05-29 ENCOUNTER — Ambulatory Visit: Payer: Medicare Other | Admitting: Pain Medicine

## 2015-05-31 ENCOUNTER — Other Ambulatory Visit: Payer: Self-pay

## 2015-05-31 MED ORDER — CLONIDINE HCL 0.1 MG/24HR TD PTWK: 0.1000 mg | MEDICATED_PATCH | TRANSDERMAL | Status: DC

## 2015-05-31 MED ORDER — PROMETHAZINE HCL 25 MG PO TABS: 25.0000 mg | ORAL_TABLET | Freq: Four times a day (QID) | ORAL | Status: AC | PRN

## 2015-05-31 NOTE — Telephone Encounter (Addendum)
Got a fax from [facility] requesting a refill of this patient's promethazine & Clonidine 0.1mg .  Refill request was sent to Dr. Steele Sizer for approval and submission.

## 2015-06-14 ENCOUNTER — Ambulatory Visit (INDEPENDENT_AMBULATORY_CARE_PROVIDER_SITE_OTHER): Payer: Medicare Other | Admitting: Cardiovascular Disease

## 2015-06-14 ENCOUNTER — Encounter: Payer: Self-pay | Admitting: Cardiovascular Disease

## 2015-06-14 VITALS — BP 163/98 | HR 88 | Ht 67.5 in | Wt 281.5 lb

## 2015-06-14 DIAGNOSIS — E785 Hyperlipidemia, unspecified: Secondary | ICD-10-CM | POA: Diagnosis not present

## 2015-06-14 DIAGNOSIS — Z6841 Body Mass Index (BMI) 40.0 and over, adult: Secondary | ICD-10-CM

## 2015-06-14 DIAGNOSIS — I5032 Chronic diastolic (congestive) heart failure: Secondary | ICD-10-CM | POA: Diagnosis not present

## 2015-06-14 DIAGNOSIS — I10 Essential (primary) hypertension: Secondary | ICD-10-CM | POA: Diagnosis not present

## 2015-06-14 MED ORDER — TORSEMIDE 20 MG PO TABS: 60.0000 mg | ORAL_TABLET | Freq: Every day | ORAL | Status: DC

## 2015-06-14 NOTE — Assessment & Plan Note (Signed)
Currently not on a statin. No lab work this year Would recommend strict diet for weight loss No known coronary artery disease

## 2015-06-14 NOTE — Assessment & Plan Note (Signed)
Blood pressure is elevated on today's visit and she reports elevated at home Recommended diet for weight loss We will change Lasix to torsemide to augment diuresis Although not started yet, could potentially use hydralazine in the future for blood pressure.  She is reluctant to increase clonidine patch as she reports this caused hypotension in the past. Potentially she could take clonidine 0.1 mg pill as needed for high blood pressure

## 2015-06-14 NOTE — Assessment & Plan Note (Signed)
We have encouraged continued exercise, careful diet management in an effort to lose weight. 

## 2015-06-14 NOTE — Progress Notes (Signed)
Patient ID: Jacqueline Campbell, female    DOB: March 20, 1960, 55 y.o.   MRN: [id number]  HPI Comments: Jacqueline Campbell is a 55 year old woman with morbid obesity, general anxiety disorder, chronic cystitis. Obstructive sleep apnea on CPAP, worsening symptoms of lower extremity edema starting August 2016, not seen significant improvement on Lasix 20 mg 3 times a day,  seen by Dr.Dew, had legs wrapped for blistering, with improvement of her symptoms, presents today for diastolic CHF, leg edema prerenal azotemia, dehydration September 2015  On her last clinic visit, recommended she take extra doses of Lasix after lunch for leg edema. She is taking 20 mg twice a day 1 day alternating with 20 mgrams in the morning/40 mg in the p.m. on alternate days Weight continues to be around 280 pounds, no change from her prior clinic visit She has not noticed any significant improvement in her leg edema, in fact symptoms may be worse She reports blood pressure has been running high at home, sometimes in the 180 range she is using lymphedema compression pump  she does not want to go up on her clonidine patch, this caused hypotension in the past   she does have some shortness of breath on exertion   other past medical history patient reportsapid weight gain starting in August September  2016, " up 45 pounds" Denies changes to her diet in that time Feels that her dry weight should be 220 pounds, down 60 pounds  Echocardiogram reviewed with her showing normal LV function, evidence of diastolic dysfunction  normal right heart pressures in October 2016   Allergies  Allergen Reactions  . Red Dye Anaphylaxis  . Abilify [Aripiprazole]   . Amitiza [Lubiprostone]   . Benadryl [Diphenhydramine Hcl]   . Doxycycline   . Gabapentin   . Iohexol   . Lac Bovis Swelling  . Risperidone And Related   . Shellfish Allergy   . Strawberry (Diagnostic) Swelling  . Strawberry Extract Swelling  . Aspirin Rash  . Ciprofloxacin Rash   . Enablex [Darifenacin] Rash  . Iodine Rash  . Latex Rash  . Levofloxacin Rash  . Penicillins Rash  . Risperidone Rash  . Sulfa Antibiotics Rash    Current Outpatient Prescriptions on File Prior to Visit  Medication Sig Dispense Refill  . acyclovir ointment (ZOVIRAX) 5 % Apply 1 application topically 5 (five) times daily as needed. 30 g 5  . clindamycin (CLEOCIN) 150 MG capsule Take 150 mg by mouth 4 (four) times daily.    . cloNIDine (CATAPRES - DOSED IN MG/24 HR) 0.1 mg/24hr patch Place 1 patch (0.1 mg total) onto the skin once a week. 4 patch 2  . diazepam (VALIUM) 5 MG tablet Take 1 tablet (5 mg total) by mouth every 6 (six) hours as needed for anxiety. 120 tablet 2  . dorzolamide (TRUSOPT) 2 % ophthalmic solution Place 1 drop into both eyes 3 (three) times daily.    Marland Kitchen EPINEPHRINE IJ Inject as directed as needed. Epi pen    . esomeprazole (NEXIUM) 40 MG capsule Take 1 capsule (40 mg total) by mouth daily before breakfast. 30 capsule 5  . Fluticasone-Salmeterol (ADVAIR) 500-50 MCG/DOSE AEPB Inhale 1 puff into the lungs every 12 (twelve) hours.    . Linaclotide (LINZESS) 145 MCG CAPS capsule Take 1 capsule (145 mcg total) by mouth daily. 30 capsule 5  . loratadine (CLARITIN) 10 MG tablet TAKE ONE (1) TABLET EACH DAY 30 tablet 5  . meloxicam (MOBIC) 7.5 MG tablet Take 1  tablet (7.5 mg total) by mouth daily. 30 tablet 2  . mometasone (NASONEX) 50 MCG/ACT nasal spray Place 2 sprays into the nose daily. 30 g 5  . montelukast (SINGULAIR) 10 MG tablet Take 1 tablet (10 mg total) by mouth at bedtime. 30 tablet 5  . nadolol (CORGARD) 40 MG tablet Take 1 tablet (40 mg total) by mouth at bedtime. 30 tablet 5  . olmesartan-hydrochlorothiazide (BENICAR HCT) 40-25 MG tablet Take 1 tablet by mouth daily. 30 tablet 5  . oxyCODONE (ROXICODONE) 15 MG immediate release tablet Limit 1 tab by mouth 3 - 4  times per day if tolerated 120 tablet 0  . pentosan polysulfate (ELMIRON) 100 MG capsule Take 1  capsule (100 mg total) by mouth 3 (three) times daily before meals. 90 capsule 12  . potassium chloride SA (K-DUR,KLOR-CON) 20 MEQ tablet TAKE ONE (1) TABLET EACH DAY 30 tablet 5  . promethazine (PHENERGAN) 25 MG tablet Take 1 tablet (25 mg total) by mouth every 6 (six) hours as needed for nausea. 30 tablet 2  . tiZANidine (ZANAFLEX) 4 MG tablet Limit 1 tablet by mouth 2 -4  times per day if tolerated 100 tablet 0  . traZODone (DESYREL) 50 MG tablet Take 0.5-1.5 tablets (25-75 mg total) by mouth at bedtime as needed for sleep. 45 tablet 5  . valACYclovir (VALTREX) 500 MG tablet Take 1 tablet (500 mg total) by mouth 1 day or 1 dose. 30 tablet 12  . zolpidem (AMBIEN) 5 MG tablet Take 1 tablet (5 mg total) by mouth at bedtime as needed for sleep. 30 tablet 2   Current Facility-Administered Medications on File Prior to Visit  Medication Dose Route Frequency Provider Last Rate Last Dose  . bupivacaine (PF) (MARCAINE) 0.25 % injection 30 mL  30 mL Other Once Mohammed Kindle, MD      . fentaNYL (SUBLIMAZE) injection 100 mcg  100 mcg Intravenous Once Mohammed Kindle, MD      . lactated ringers infusion 1,000 mL  1,000 mL Intravenous Continuous Mohammed Kindle, MD      . midazolam (VERSED) 5 MG/5ML injection 5 mg  5 mg Intravenous Once Mohammed Kindle, MD      . orphenadrine (NORFLEX) injection 60 mg  60 mg Intramuscular Once Mohammed Kindle, MD      . triamcinolone acetonide (KENALOG-40) injection 40 mg  40 mg Other Once Mohammed Kindle, MD        Past Medical History  Diagnosis Date  . Asthma   . Glaucoma   . GERD (gastroesophageal reflux disease)   . Cancer ([place]) 1992    cervical  . Ovarian cancer ([place]) 1994  . Throat cancer ([place]) 1998  . Metabolic syndrome   . Obesity   . Chronic insomnia   . Mood disorder ([place])   . Bladder incontinence   . Plantar fasciitis   . Chronic interstitial cystitis   . Nausea     Chronic  . CHF (congestive heart failure) ([place])   . Hypertension     Past Surgical  History  Procedure Laterality Date  . Tubal ligation  1989  . Abdominal hysterectomy  1992  . Tonsillectomy and adenoidectomy  1994  . Foot surgery Bilateral 1996  . Knee surgery Left     age 49  . Eye surgery Bilateral 2006, 2008, 2010  . Bladder surgery      multiple  . Breast biopsy Right     Social History  reports that she has never smoked. She  has never used smokeless tobacco. She reports that she does not drink alcohol or use illicit drugs.  Family History family history includes Breast cancer in her mother; COPD in her mother; Cancer in her father; Diabetes in her mother; Heart disease in her father and mother; Hypertension in her mother; Leukemia in her son; Prostate cancer in her brother; Stroke in her mother.   Review of Systems  Constitutional: Positive for unexpected weight change.  Respiratory: Negative.   Cardiovascular: Positive for leg swelling.  Gastrointestinal: Negative.   Musculoskeletal: Negative.   Neurological: Negative.   Hematological: Negative.   Psychiatric/Behavioral: Negative.   All other systems reviewed and are negative.   BP 163/98 mmHg  Pulse 88  Ht 5' 7.5" (1.715 m)  Wt 281 lb 8 oz (127.688 kg)  BMI 43.41 kg/m2  SpO2 98%  Physical Exam  Constitutional: She is oriented to person, place, and time. She appears well-developed and well-nourished.  Morbidly obese  HENT:  Head: Normocephalic.  Nose: Nose normal.  Mouth/Throat: Oropharynx is clear and moist.  Eyes: Conjunctivae are normal. Pupils are equal, round, and reactive to light.  Neck: Normal range of motion. Neck supple. No JVD present.  Cardiovascular: Normal rate, regular rhythm, normal heart sounds and intact distal pulses.  Exam reveals no gallop and no friction rub.   No murmur heard. 1+ pitting edema to below the knees bilaterally  Pulmonary/Chest: Effort normal and breath sounds normal. No respiratory distress. She has no wheezes. She has no rales. She exhibits no  tenderness.  Abdominal: Soft. Bowel sounds are normal. She exhibits no distension. There is no tenderness.  Musculoskeletal: Normal range of motion. She exhibits no edema or tenderness.  Lymphadenopathy:    She has no cervical adenopathy.  Neurological: She is alert and oriented to person, place, and time. Coordination normal.  Skin: Skin is warm and dry. No rash noted. No erythema.  Psychiatric: She has a normal mood and affect. Her behavior is normal. Judgment and thought content normal.

## 2015-06-14 NOTE — Patient Instructions (Addendum)
You have extra fluid in your legs  Please stop the furosemide/lasix  Start torsemide/demedex one pill twice a day For worsening swelling in the legs take two of the torsemide in the morning  Please call us if you have new issues that need to be addressed before your next appt.  Your physician wants you to follow-up in: 3 months.  You will receive a reminder letter in the mail two months in advance. If you don't receive a letter, please call our office to schedule the follow-up appointment.  Steps to Quit Smoking  Smoking tobacco can be harmful to your health and can affect almost every organ in your body. Smoking puts you, and those around you, at risk for developing many serious chronic diseases. Quitting smoking is difficult, but it is one of the best things that you can do for your health. It is never too late to quit. WHAT ARE THE BENEFITS OF QUITTING SMOKING? When you quit smoking, you lower your risk of developing serious diseases and conditions, such as:  Lung cancer or lung disease, such as COPD.  Heart disease.  Stroke.  Heart attack.  Infertility.  Osteoporosis and bone fractures. Additionally, symptoms such as coughing, wheezing, and shortness of breath may get better when you quit. You may also find that you get sick less often because your body is stronger at fighting off colds and infections. If you are pregnant, quitting smoking can help to reduce your chances of having a baby of low birth weight. HOW DO I GET READY TO QUIT? When you decide to quit smoking, create a plan to make sure that you are successful. Before you quit:  Pick a date to quit. Set a date within the next two weeks to give you time to prepare.  Write down the reasons why you are quitting. Keep this list in places where you will see it often, such as on your bathroom mirror or in your car or wallet.  Identify the people, places, things, and activities that make you want to smoke (triggers) and avoid  them. Make sure to take these actions:  Throw away all cigarettes at home, at work, and in your car.  Throw away smoking accessories, such as Scientist, research (medical).  Clean your car and make sure to empty the ashtray.  Clean your home, including curtains and carpets.  Tell your family, friends, and coworkers that you are quitting. Support from your loved ones can make quitting easier.  Talk with your health care provider about your options for quitting smoking.  Find out what treatment options are covered by your health insurance. WHAT STRATEGIES CAN I USE TO QUIT SMOKING?  Talk with your healthcare provider about different strategies to quit smoking. Some strategies include:  Quitting smoking altogether instead of gradually lessening how much you smoke over a period of time. Research shows that quitting "cold [place]" is more successful than gradually quitting.  Attending in-person counseling to help you build problem-solving skills. You are more likely to have success in quitting if you attend several counseling sessions. Even short sessions of 10 minutes can be effective.  Finding resources and support systems that can help you to quit smoking and remain smoke-free after you quit. These resources are most helpful when you use them often. They can include:  Online chats with a Social worker.  Telephone quitlines.  Printed Furniture conservator/restorer.  Support groups or group counseling.  Text messaging programs.  Mobile phone applications.  Taking medicines to help you  quit smoking. (If you are pregnant or breastfeeding, talk with your health care provider first.) Some medicines contain nicotine and some do not. Both types of medicines help with cravings, but the medicines that include nicotine help to relieve withdrawal symptoms. Your health care provider may recommend:  Nicotine patches, gum, or lozenges.  Nicotine inhalers or sprays.  Non-nicotine medicine that is taken by  mouth. Talk with your health care provider about combining strategies, such as taking medicines while you are also receiving in-person counseling. Using these two strategies together makes you more likely to succeed in quitting than if you used either strategy on its own. If you are pregnant or breastfeeding, talk with your health care provider about finding counseling or other support strategies to quit smoking. Do not take medicine to help you quit smoking unless told to do so by your health care provider. WHAT THINGS CAN I DO TO MAKE IT EASIER TO QUIT? Quitting smoking might feel overwhelming at first, but there is a lot that you can do to make it easier. Take these important actions:  Reach out to your family and friends and ask that they support and encourage you during this time. Call telephone quitlines, reach out to support groups, or work with a counselor for support.  Ask people who smoke to avoid smoking around you.  Avoid places that trigger you to smoke, such as bars, parties, or smoke-break areas at work.  Spend time around people who do not smoke.  Lessen stress in your life, because stress can be a smoking trigger for some people. To lessen stress, try:  Exercising regularly.  Deep-breathing exercises.  Yoga.  Meditating.  Performing a body scan. This involves closing your eyes, scanning your body from head to toe, and noticing which parts of your body are particularly tense. Purposefully relax the muscles in those areas.  Download or purchase mobile phone or tablet apps (applications) that can help you stick to your quit plan by providing reminders, tips, and encouragement. There are many free apps, such as QuitGuide from the [employer] Office manager for Disease Control and Prevention). You can find other support for quitting smoking (smoking cessation) through smokefree.gov and other websites. HOW WILL I FEEL WHEN I QUIT SMOKING? Within the first 24 hours of quitting smoking, you  may start to feel some withdrawal symptoms. These symptoms are usually most noticeable 2-3 days after quitting, but they usually do not last beyond 2-3 weeks. Changes or symptoms that you might experience include:  Mood swings.  Restlessness, anxiety, or irritation.  Difficulty concentrating.  Dizziness.  Strong cravings for sugary foods in addition to nicotine.  Mild weight gain.  Constipation.  Nausea.  Coughing or a sore throat.  Changes in how your medicines work in your body.  A depressed mood.  Difficulty sleeping (insomnia). After the first 2-3 weeks of quitting, you may start to notice more positive results, such as:  Improved sense of smell and taste.  Decreased coughing and sore throat.  Slower heart rate.  Lower blood pressure.  Clearer skin.  The ability to breathe more easily.  Fewer sick days. Quitting smoking is very challenging for most people. Do not get discouraged if you are not successful the first time. Some people need to make many attempts to quit before they achieve long-term success. Do your best to stick to your quit plan, and talk with your health care provider if you have any questions or concerns.   This information is not intended to  replace advice given to you by your health care provider. Make sure you discuss any questions you have with your health care provider.   Document Released: 02/24/2001 Document Revised: 07/17/2014 Document Reviewed: 07/17/2014 Elsevier Interactive Patient Education 2016 [employer]. Smoking Hazards Smoking cigarettes is extremely bad for your health. Tobacco smoke has over 200 known poisons in it. It contains the poisonous gases nitrogen oxide and carbon monoxide. There are over 60 chemicals in tobacco smoke that cause cancer. Some of the chemicals found in cigarette smoke include:   Cyanide.   Benzene.   Formaldehyde.   Methanol (wood alcohol).   Acetylene (fuel used in welding torches).    Ammonia.  Even smoking lightly shortens your life expectancy by several years. You can greatly reduce the risk of medical problems for you and your family by stopping now. Smoking is the most preventable cause of death and disease in our society. Within days of quitting smoking, your circulation improves, you decrease the risk of having a heart attack, and your lung capacity improves. There may be some increased phlegm in the first few days after quitting, and it may take months for your lungs to clear up completely. Quitting for 10 years reduces your risk of developing lung cancer to almost that of a nonsmoker.  WHAT ARE THE RISKS OF SMOKING? Cigarette smokers have an increased risk of many serious medical problems, including:  Lung cancer.   Lung disease (such as pneumonia, bronchitis, and emphysema).   Heart attack and chest pain due to the heart not getting enough oxygen (angina).   Heart disease and peripheral blood vessel disease.   Hypertension.   Stroke.   Oral cancer (cancer of the lip, mouth, or voice box).   Bladder cancer.   Pancreatic cancer.   Cervical cancer.   Pregnancy complications, including premature birth.   Stillbirths and smaller newborn babies, birth defects, and genetic damage to sperm.   Early menopause.   Lower estrogen level for women.   Infertility.   Facial wrinkles.   Blindness.   Increased risk of broken bones (fractures).   Senile dementia.   Stomach ulcers and internal bleeding.   Delayed wound healing and increased risk of complications during surgery. Because of secondhand smoke exposure, children of smokers have an increased risk of the following:   Sudden infant death syndrome (SIDS).   Respiratory infections.   Lung cancer.   Heart disease.   Ear infections.  WHY IS SMOKING ADDICTIVE? Nicotine is the chemical agent in tobacco that is capable of causing addiction or dependence. When you  smoke and inhale, nicotine is absorbed rapidly into the bloodstream through your lungs. Both inhaled and noninhaled nicotine may be addictive.  WHAT ARE THE BENEFITS OF QUITTING?  There are many health benefits to quitting smoking. Some are:   The likelihood of developing cancer and heart disease decreases. Health improvements are seen almost immediately.   Blood pressure, pulse rate, and breathing patterns start returning to normal soon after quitting.   People who quit may see an improvement in their overall quality of life.  HOW DO YOU QUIT SMOKING? Smoking is an addiction with both physical and psychological effects, and longtime habits can be hard to change. Your health care provider can recommend:  Programs and community resources, which may include group support, education, or therapy.  Replacement products, such as patches, gum, and nasal sprays. Use these products only as directed. Do not replace cigarette smoking with electronic cigarettes (commonly called e-cigarettes). The  safety of e-cigarettes is unknown, and some may contain harmful chemicals. FOR MORE INFORMATION  American Lung Association: www.lung.org  American Cancer Society: www.cancer.org   This information is not intended to replace advice given to you by your health care provider. Make sure you discuss any questions you have with your health care provider.   Document Released: 04/09/2004 Document Revised: 12/21/2012 Document Reviewed: 08/22/2012 Elsevier Interactive Patient Education 2016 [employer] WHAT IS SECONDHAND SMOKE? Secondhand smoke is smoke that comes from burning tobacco. It could be the smoke from a cigarette, a pipe, or a cigar. Even if you are not the one smoking, secondhand smoke exposes you to the dangers of smoking. This is called involuntary, or passive, smoking. There are two types of secondhand smoke:  Sidestream smoke is the smoke that comes off the lighted end of a  cigarette, pipe, or cigar.  This type of smoke has the highest amount of cancer-causing agents (carcinogens).  The particles in sidestream smoke are smaller. They get into your lungs more easily.  Mainstream smoke is the smoke that is exhaled by a person who is smoking.  This type of smoke is also dangerous to your health. HOW CAN SECONDHAND SMOKE AFFECT MY HEALTH? Studies show that there is no safe level of secondhand smoke. This smoke contains thousands of chemicals. At least 68 of them are known to cause cancer. Secondhand smoke can also cause many other health problems. It has been linked to:  Lung cancer.  Cancer of the voice box (larynx) or throat.  Cancer of the sinuses.  Brain cancer.  Bladder cancer.  Stomach cancer.  Breast cancer.  White blood cell cancers (lymphoma and leukemia).  Brain and liver tumors in children.  Heart disease and stroke in adults.  Pregnancy loss (miscarriage).  Diseases in children, such as:  Asthma.  Lung infections.  Ear infections.  Sudden infant death syndrome (SIDS).  Slow growth. WHERE CAN I BE AT RISK FOR EXPOSURE TO SECONDHAND SMOKE?   For adults, the workplace is the main source of exposure to secondhand smoke.  Your workplace should have a policy separating smoking areas from nonsmoking areas.  Smoking areas should have a system for ventilating and cleaning the air.  For children, the home may be the most dangerous place for exposure to secondhand smoke.  Children who live in apartment buildings may be at risk from smoke drifting from hallways or other people's homes.  For everyone, many public places are possible sources of exposure to secondhand smoke.  These places include restaurants, shopping centers, and parks. HOW CAN I REDUCE MY RISK FOR EXPOSURE TO SECONDHAND SMOKE? The most important thing you can do is not smoke. Discourage family members from smoking. Other ways to reduce exposure for you and your  family include the following:  Keep your home smoke free.  Make sure your child care providers do not smoke.  Warn your child about the dangers of smoking and secondhand smoke.  Do not allow smoking in your car. When someone smokes in a car, all the damaging chemicals from the smoke are confined in a small area.  Avoid public places where smoking is allowed.   This information is not intended to replace advice given to you by your health care provider. Make sure you discuss any questions you have with your health care provider.   Document Released: 04/09/2004 Document Revised: 03/23/2014 Document Reviewed: 06/16/2013 Elsevier Interactive Patient Education [employer].

## 2015-06-14 NOTE — Assessment & Plan Note (Signed)
Fluid retention likely secondary to morbid obesity We'll change Lasix to torsemide 20 mg twice a day Recommended she only takes additional torsemide for weight gain.

## 2015-06-21 ENCOUNTER — Telehealth: Payer: Self-pay

## 2015-06-21 NOTE — Telephone Encounter (Signed)
Got a fax from [employer] stating that the sample could not be processed.  "The 72 hour sample stability limit had been exceeded. The patient will be contacted to initiate a new sample collection.

## 2015-06-27 ENCOUNTER — Ambulatory Visit: Payer: Medicare Other | Attending: Pain Medicine | Admitting: Pain Medicine

## 2015-06-27 ENCOUNTER — Encounter: Payer: Self-pay | Admitting: Pain Medicine

## 2015-06-27 VITALS — BP 130/110 | HR 81 | Temp 96.6°F | Resp 15 | Ht 67.0 in | Wt 260.0 lb

## 2015-06-27 DIAGNOSIS — M5136 Other intervertebral disc degeneration, lumbar region: Secondary | ICD-10-CM | POA: Insufficient documentation

## 2015-06-27 DIAGNOSIS — M5481 Occipital neuralgia: Secondary | ICD-10-CM | POA: Insufficient documentation

## 2015-06-27 DIAGNOSIS — M961 Postlaminectomy syndrome, not elsewhere classified: Secondary | ICD-10-CM

## 2015-06-27 DIAGNOSIS — M542 Cervicalgia: Secondary | ICD-10-CM | POA: Diagnosis present

## 2015-06-27 DIAGNOSIS — M5030 Other cervical disc degeneration, unspecified cervical region: Secondary | ICD-10-CM | POA: Diagnosis not present

## 2015-06-27 DIAGNOSIS — M5134 Other intervertebral disc degeneration, thoracic region: Secondary | ICD-10-CM

## 2015-06-27 DIAGNOSIS — M533 Sacrococcygeal disorders, not elsewhere classified: Secondary | ICD-10-CM | POA: Diagnosis not present

## 2015-06-27 DIAGNOSIS — M170 Bilateral primary osteoarthritis of knee: Secondary | ICD-10-CM | POA: Diagnosis not present

## 2015-06-27 DIAGNOSIS — R51 Headache: Secondary | ICD-10-CM | POA: Insufficient documentation

## 2015-06-27 DIAGNOSIS — M47816 Spondylosis without myelopathy or radiculopathy, lumbar region: Secondary | ICD-10-CM | POA: Insufficient documentation

## 2015-06-27 DIAGNOSIS — M5126 Other intervertebral disc displacement, lumbar region: Secondary | ICD-10-CM | POA: Diagnosis not present

## 2015-06-27 MED ORDER — TIZANIDINE HCL 4 MG PO TABS: ORAL_TABLET | ORAL | Status: DC

## 2015-06-27 MED ORDER — OXYCODONE HCL 15 MG PO TABS: ORAL_TABLET | ORAL | Status: DC

## 2015-06-27 NOTE — Progress Notes (Signed)
Subjective:    Patient ID: Jacqueline Campbell, female    DOB: 06-14-1960, 55 y.o.   MRN: [id number]  HPI   The patient is a 55 year old female who returns to pain management for further evaluation and treatment of pain involving the neck entire back upper and lower extremity regions and headaches. The patient states that she has pain radiating to the back of the neck to the back of the head causing headaches. The patient denies any trauma change in events of daily living the call significant change in symptomatology. The patient states that the pain of the lower back lower extremity region is present and to a lesser degree of intensity patient states that she will undergo further evaluation in terms of evaluation of her cardiac condition urological condition and vascular condition. The patient will continue psych evaluations as discussed. We will avoid interventional treatment at this time and we'll remain available to consider patient for interventional treatment pending follow-up evaluations. The patient is with pain fairly well-controlled present time with the medication oxycodone and Zanaflex. All agreed to suggested treatment plan       Review of Systems     Objective:   Physical Exam  There was tenderness of the splenius capitis and occipitalis musculature regions a moderate degree with moderate tenderness over the cervical facet cervical paraspinal musculature region on the left as well as on the right. There were no masses of the hip negative noted there was minimal tenderness to palpation of the temporomandibular joint region and no bounding pulsations of the temporal region were noted. Palpation over the cervical facet cervical paraspinal musculature region was with evidence of moderate muscle spasm moderate degree. Palpation over the thoracic region thoracic facet region was evidence of moderate muscle spasm involving the mid and lower thoracic region with lateral bending rotation  extension and palpation of the lumbar facets reproducing moderate discomfort. The patient appeared to be with bilaterally equal grip strength and Tinel and Phalen's maneuver were without increased pain of significant degree. The patient appeared to be with tenderness to palpation of the acromioclavicular and glenohumeral joint region a moderate degree with unremarkable Spurling's maneuver. Was moderate tenderness of the PSIS and PII S regions. There was mild to moderate tenderness over the greater trochanteric region and iliotibial band region. The knees were tenderness to palpation with negative anterior and posterior drawer signs without ballottement of the patella. Straight leg raising was tolerates approximately 20 without increased pain with dorsiflexion noted. There was negative clonus negative Homans      Assessment & Plan:     Degenerative disc disease lumbar spine Degenerative changes of the lumbar spine with L4-L5 degenerative changes and central disc protrusion  Degenerative joint disease of knees  Degenerative disc disease of the cervical spine C5-6 right-sided involvement most progressed with compression of the C5 nerve root  Sacroiliac joint dysfunction  Lumbar facet syndrome  Greater occipital neuralgia      PLAN   Continue present medication Zanaflex  and oxycodone  F/U PCP Dr.Sowles for evaluation of blood pressure and general medical condition . The patient is to follow-up with Dr. Ancil Boozer and Dr. Rockey Situ for evaluation of blood pressure which was noted to be significantly elevated on today's visit  F/U cardiologist   Dr.Gollan. The patient has an appointment with her cardiologist regarding her elevated blood pressure and cardiac condition  Psych follow-up evaluation as discussed.    F/U vascular evaluation with Dr. Lucky Cowboy as discussed  F/U surgical evaluation as  discussed  F/U urological evaluation as discussed   F/U neurological evaluation. May consider  pending follow-up evaluation  F/U wound care clinic as discussed   May consider radiofrequency rhizolysis or intraspinal procedures pending response to present treatment and F/U evaluation . We will avoid interventional treatment at this time  Patient to call [facility] should patient have concerns prior to scheduled return appointment.

## 2015-06-27 NOTE — Patient Instructions (Addendum)
PLAN   Continue present medication Zanaflex  and oxycodone  F/U PCP Dr.Sowles for evaluation of blood pressure and general medical condition . The patient is to follow-up with Dr. Ancil Boozer and Dr. Rockey Situ for evaluation of blood pressure which was noted to be significantly elevated on today's visit  F/U cardiologist   Dr.Gollan. The patient has an appointment with her cardiologist regarding her elevated blood pressure and cardiac condition  Psych follow-up evaluation as discussed.    F/U vascular evaluation with Dr. Lucky Cowboy as discussed  F/U surgical evaluation as discussed  F/U urological evaluation as discussed   F/U neurological evaluation. May consider pending follow-up evaluation  F/U wound care clinic as discussed   May consider radiofrequency rhizolysis or intraspinal procedures pending response to present treatment and F/U evaluation . We will avoid interventional treatment at this time  Patient to call [facility] should patient have concerns prior to scheduled return appointment.GENERAL RISKS AND COMPLICATIONS  What are the risk, side effects and possible complications? Generally speaking, most procedures are safe.  However, with any procedure there are risks, side effects, and the possibility of complications.  The risks and complications are dependent upon the sites that are lesioned, or the type of nerve block to be performed.  The closer the procedure is to the spine, the more serious the risks are.  Great care is taken when placing the radio frequency needles, block needles or lesioning probes, but sometimes complications can occur. 1. Infection: Any time there is an injection through the skin, there is a risk of infection.  This is why sterile conditions are used for these blocks.  There are four possible types of infection. 1. Localized skin infection. 2. Central Nervous System Infection-This can be in the form of Meningitis, which can be deadly. 3. Epidural  Infections-This can be in the form of an epidural abscess, which can cause pressure inside of the spine, causing compression of the spinal cord with subsequent paralysis. This would require an emergency surgery to decompress, and there are no guarantees that the patient would recover from the paralysis. 4. Discitis-This is an infection of the intervertebral discs.  It occurs in about 1% of discography procedures.  It is difficult to treat and it may lead to surgery.        2. Pain: the needles have to go through skin and soft tissues, will cause soreness.       3. Damage to internal structures:  The nerves to be lesioned may be near blood vessels or    other nerves which can be potentially damaged.       4. Bleeding: Bleeding is more common if the patient is taking blood thinners such as  aspirin, Coumadin, Ticiid, Plavix, etc., or if he/she have some genetic predisposition  such as hemophilia. Bleeding into the spinal canal can cause compression of the spinal  cord with subsequent paralysis.  This would require an emergency surgery to  decompress and there are no guarantees that the patient would recover from the  paralysis.       5. Pneumothorax:  Puncturing of a lung is a possibility, every time a needle is introduced in  the area of the chest or upper back.  Pneumothorax refers to free air around the  collapsed lung(s), inside of the thoracic cavity (chest cavity).  Another two possible  complications related to a similar event would include: Hemothorax and Chylothorax.   These are variations of the Pneumothorax, where instead of air  around the collapsed  lung(s), you may have blood or chyle, respectively.       6. Spinal headaches: They may occur with any procedures in the area of the spine.       7. Persistent CSF (Cerebro-Spinal Fluid) leakage: This is a rare problem, but may occur  with prolonged intrathecal or epidural catheters either due to the formation of a fistulous  track or a dural tear.        8. Nerve damage: By working so close to the spinal cord, there is always a possibility of  nerve damage, which could be as serious as a permanent spinal cord injury with  paralysis.       9. Death:  Although rare, severe deadly allergic reactions known as "Anaphylactic  reaction" can occur to any of the medications used.      10. Worsening of the symptoms:  We can always make thing worse.  What are the chances of something like this happening? Chances of any of this occuring are extremely low.  By statistics, you have more of a chance of getting killed in a motor vehicle accident: while driving to the hospital than any of the above occurring .  Nevertheless, you should be aware that they are possibilities.  In general, it is similar to taking a shower.  Everybody knows that you can slip, hit your head and get killed.  Does that mean that you should not shower again?  Nevertheless always keep in mind that statistics do not mean anything if you happen to be on the wrong side of them.  Even if a procedure has a 1 (one) in a 1,000,000 (million) chance of going wrong, it you happen to be that one..Also, keep in mind that by statistics, you have more of a chance of having something go wrong when taking medications.  Who should not have this procedure? If you are on a blood thinning medication (e.g. Coumadin, Plavix, see list of "Blood Thinners"), or if you have an active infection going on, you should not have the procedure.  If you are taking any blood thinners, please inform your physician.  How should I prepare for this procedure?  Do not eat or drink anything at least six hours prior to the procedure.  Bring a driver with you .  It cannot be a taxi.  Come accompanied by an adult that can drive you back, and that is strong enough to help you if your legs get weak or numb from the local anesthetic.  Take all of your medicines the morning of the procedure with just enough water to swallow them.  If  you have diabetes, make sure that you are scheduled to have your procedure done first thing in the morning, whenever possible.  If you have diabetes, take only half of your insulin dose and notify our nurse that you have done so as soon as you arrive at the clinic.  If you are diabetic, but only take blood sugar pills (oral hypoglycemic), then do not take them on the morning of your procedure.  You may take them after you have had the procedure.  Do not take aspirin or any aspirin-containing medications, at least eleven (11) days prior to the procedure.  They may prolong bleeding.  Wear loose fitting clothing that may be easy to take off and that you would not mind if it got stained with Betadine or blood.  Do not wear any jewelry or perfume  Remove any  nail coloring.  It will interfere with some of our monitoring equipment.  NOTE: Remember that this is not meant to be interpreted as a complete list of all possible complications.  Unforeseen problems may occur.  BLOOD THINNERS The following drugs contain aspirin or other products, which can cause increased bleeding during surgery and should not be taken for 2 weeks prior to and 1 week after surgery.  If you should need take something for relief of minor pain, you may take acetaminophen which is found in Tylenol,m Datril, Anacin-3 and Panadol. It is not blood thinner. The products listed below are.  Do not take any of the products listed below in addition to any listed on your instruction sheet.  A.P.C or A.P.C with Codeine Codeine Phosphate Capsules #3 Ibuprofen Ridaura  ABC compound Congesprin Imuran rimadil  Advil Cope Indocin Robaxisal  Alka-Seltzer Effervescent Pain Reliever and Antacid Coricidin or Coricidin-D  Indomethacin Rufen  Alka-Seltzer plus Cold Medicine Cosprin Ketoprofen S-A-C Tablets  Anacin Analgesic Tablets or Capsules Coumadin Korlgesic Salflex  Anacin Extra Strength Analgesic tablets or capsules CP-2 Tablets Lanoril  Salicylate  Anaprox Cuprimine Capsules Levenox Salocol  Anexsia-D Dalteparin Magan Salsalate  Anodynos Darvon compound Magnesium Salicylate Sine-off  Ansaid Dasin Capsules Magsal Sodium Salicylate  Anturane Depen Capsules Marnal Soma  APF Arthritis pain formula Dewitt's Pills Measurin Stanback  Argesic Dia-Gesic Meclofenamic Sulfinpyrazone  Arthritis Bayer Timed Release Aspirin Diclofenac Meclomen Sulindac  Arthritis pain formula Anacin Dicumarol Medipren Supac  Analgesic (Safety coated) Arthralgen Diffunasal Mefanamic Suprofen  Arthritis Strength Bufferin Dihydrocodeine Mepro Compound Suprol  Arthropan liquid Dopirydamole Methcarbomol with Aspirin Synalgos  ASA tablets/Enseals Disalcid Micrainin Tagament  Ascriptin Doan's Midol Talwin  Ascriptin A/D Dolene Mobidin Tanderil  Ascriptin Extra Strength Dolobid Moblgesic Ticlid  Ascriptin with Codeine Doloprin or Doloprin with Codeine Momentum Tolectin  Asperbuf Duoprin Mono-gesic Trendar  Aspergum Duradyne Motrin or Motrin IB Triminicin  Aspirin plain, buffered or enteric coated Durasal Myochrisine Trigesic  Aspirin Suppositories Easprin Nalfon Trillsate  Aspirin with Codeine Ecotrin Regular or Extra Strength Naprosyn Uracel  Atromid-S Efficin Naproxen Ursinus  Auranofin Capsules Elmiron Neocylate Vanquish  Axotal Emagrin Norgesic Verin  Azathioprine Empirin or Empirin with Codeine Normiflo Vitamin E  Azolid Emprazil Nuprin Voltaren  Bayer Aspirin plain, buffered or children's or timed BC Tablets or powders Encaprin Orgaran Warfarin Sodium  Buff-a-Comp Enoxaparin Orudis Zorpin  Buff-a-Comp with Codeine Equegesic Os-Cal-Gesic   Buffaprin Excedrin plain, buffered or Extra Strength Oxalid   Bufferin Arthritis Strength Feldene Oxphenbutazone   Bufferin plain or Extra Strength Feldene Capsules Oxycodone with Aspirin   Bufferin with Codeine Fenoprofen Fenoprofen Pabalate or Pabalate-SF   Buffets II Flogesic Panagesic   Buffinol plain or  Extra Strength Florinal or Florinal with Codeine Panwarfarin   Buf-Tabs Flurbiprofen Penicillamine   Butalbital Compound Four-way cold tablets Penicillin   Butazolidin Fragmin Pepto-Bismol   Carbenicillin Geminisyn Percodan   Carna Arthritis Reliever Geopen Persantine   Carprofen Gold's salt Persistin   Chloramphenicol Goody's Phenylbutazone   Chloromycetin Haltrain Piroxlcam   Clmetidine heparin Plaquenil   Cllnoril Hyco-pap Ponstel   Clofibrate Hydroxy chloroquine Propoxyphen         Before stopping any of these medications, be sure to consult the physician who ordered them.  Some, such as Coumadin (Warfarin) are ordered to prevent or treat serious conditions such as "deep thrombosis", "pumonary embolisms", and other heart problems.  The amount of time that you may need off of the medication may also vary with the medication and the reason  for which you were taking it.  If you are taking any of these medications, please make sure you notify your pain physician before you undergo any procedures.

## 2015-06-27 NOTE — Progress Notes (Signed)
Safety precautions to be maintained throughout the outpatient stay will include: orient to surroundings, keep bed in low position, maintain call bell within reach at all times, provide assistance with transfer out of bed and ambulation.  

## 2015-07-01 ENCOUNTER — Other Ambulatory Visit: Payer: Self-pay

## 2015-07-01 DIAGNOSIS — I10 Essential (primary) hypertension: Secondary | ICD-10-CM

## 2015-07-01 DIAGNOSIS — I5030 Unspecified diastolic (congestive) heart failure: Secondary | ICD-10-CM

## 2015-07-01 MED ORDER — OLMESARTAN MEDOXOMIL-HCTZ 40-25 MG PO TABS: 1.0000 | ORAL_TABLET | Freq: Every day | ORAL | Status: DC

## 2015-07-01 NOTE — Telephone Encounter (Signed)
Got a fax from [place] with [employer] stating that this [employer] request that she gets a 90 day supply of her Benicar HCT 40-25.  Refill request was sent to Dr. Steele Sizer for approval and submission.

## 2015-07-10 ENCOUNTER — Ambulatory Visit: Payer: Medicare Other | Admitting: Family Medicine

## 2015-07-17 ENCOUNTER — Ambulatory Visit: Payer: Medicare Other | Admitting: Family Medicine

## 2015-07-25 ENCOUNTER — Encounter: Payer: Self-pay | Admitting: Family Medicine

## 2015-07-25 ENCOUNTER — Encounter: Payer: Self-pay | Admitting: Pain Medicine

## 2015-07-25 ENCOUNTER — Ambulatory Visit: Payer: Medicare Other | Attending: Pain Medicine | Admitting: Pain Medicine

## 2015-07-25 ENCOUNTER — Ambulatory Visit (INDEPENDENT_AMBULATORY_CARE_PROVIDER_SITE_OTHER): Payer: Medicare Other | Admitting: Family Medicine

## 2015-07-25 VITALS — BP 114/74 | HR 84 | Temp 98.4°F | Resp 16 | Wt 255.0 lb

## 2015-07-25 VITALS — BP 134/90 | HR 94 | Temp 98.0°F | Resp 16 | Ht 67.0 in | Wt 273.6 lb

## 2015-07-25 DIAGNOSIS — K219 Gastro-esophageal reflux disease without esophagitis: Secondary | ICD-10-CM | POA: Diagnosis not present

## 2015-07-25 DIAGNOSIS — J309 Allergic rhinitis, unspecified: Secondary | ICD-10-CM

## 2015-07-25 DIAGNOSIS — M25562 Pain in left knee: Secondary | ICD-10-CM | POA: Diagnosis not present

## 2015-07-25 DIAGNOSIS — M961 Postlaminectomy syndrome, not elsewhere classified: Secondary | ICD-10-CM

## 2015-07-25 DIAGNOSIS — J4540 Moderate persistent asthma, uncomplicated: Secondary | ICD-10-CM

## 2015-07-25 DIAGNOSIS — J3089 Other allergic rhinitis: Secondary | ICD-10-CM

## 2015-07-25 DIAGNOSIS — F411 Generalized anxiety disorder: Secondary | ICD-10-CM

## 2015-07-25 DIAGNOSIS — G4733 Obstructive sleep apnea (adult) (pediatric): Secondary | ICD-10-CM | POA: Diagnosis not present

## 2015-07-25 DIAGNOSIS — M5136 Other intervertebral disc degeneration, lumbar region: Secondary | ICD-10-CM

## 2015-07-25 DIAGNOSIS — R309 Painful micturition, unspecified: Secondary | ICD-10-CM

## 2015-07-25 DIAGNOSIS — M5481 Occipital neuralgia: Secondary | ICD-10-CM

## 2015-07-25 DIAGNOSIS — A6009 Herpesviral infection of other urogenital tract: Secondary | ICD-10-CM

## 2015-07-25 DIAGNOSIS — G4700 Insomnia, unspecified: Secondary | ICD-10-CM

## 2015-07-25 DIAGNOSIS — M170 Bilateral primary osteoarthritis of knee: Secondary | ICD-10-CM

## 2015-07-25 DIAGNOSIS — M5134 Other intervertebral disc degeneration, thoracic region: Secondary | ICD-10-CM

## 2015-07-25 DIAGNOSIS — I10 Essential (primary) hypertension: Secondary | ICD-10-CM

## 2015-07-25 DIAGNOSIS — M7989 Other specified soft tissue disorders: Secondary | ICD-10-CM | POA: Diagnosis not present

## 2015-07-25 DIAGNOSIS — M533 Sacrococcygeal disorders, not elsewhere classified: Secondary | ICD-10-CM

## 2015-07-25 DIAGNOSIS — K5909 Other constipation: Secondary | ICD-10-CM

## 2015-07-25 DIAGNOSIS — I5030 Unspecified diastolic (congestive) heart failure: Secondary | ICD-10-CM | POA: Diagnosis not present

## 2015-07-25 DIAGNOSIS — G8929 Other chronic pain: Secondary | ICD-10-CM

## 2015-07-25 DIAGNOSIS — Z6841 Body Mass Index (BMI) 40.0 and over, adult: Secondary | ICD-10-CM

## 2015-07-25 DIAGNOSIS — M5030 Other cervical disc degeneration, unspecified cervical region: Secondary | ICD-10-CM

## 2015-07-25 DIAGNOSIS — A609 Anogenital herpesviral infection, unspecified: Secondary | ICD-10-CM

## 2015-07-25 DIAGNOSIS — Z9989 Dependence on other enabling machines and devices: Secondary | ICD-10-CM

## 2015-07-25 LAB — POCT URINALYSIS DIPSTICK
BILIRUBIN UA: NEGATIVE
GLUCOSE UA: NEGATIVE
Ketones, UA: NEGATIVE
Leukocytes, UA: NEGATIVE
Nitrite, UA: NEGATIVE
Protein, UA: NEGATIVE
RBC UA: NEGATIVE
SPEC GRAV UA: 1.02
UROBILINOGEN UA: NEGATIVE
pH, UA: 6

## 2015-07-25 MED ORDER — NADOLOL 40 MG PO TABS: 40.0000 mg | ORAL_TABLET | Freq: Every day | ORAL | Status: DC

## 2015-07-25 MED ORDER — LINACLOTIDE 145 MCG PO CAPS: 145.0000 ug | ORAL_CAPSULE | Freq: Every day | ORAL | Status: DC

## 2015-07-25 MED ORDER — TIZANIDINE HCL 4 MG PO TABS: ORAL_TABLET | ORAL | Status: DC

## 2015-07-25 MED ORDER — CLONIDINE HCL 0.1 MG/24HR TD PTWK: 0.1000 mg | MEDICATED_PATCH | TRANSDERMAL | Status: DC

## 2015-07-25 MED ORDER — MONTELUKAST SODIUM 10 MG PO TABS: 10.0000 mg | ORAL_TABLET | Freq: Every day | ORAL | Status: DC

## 2015-07-25 MED ORDER — OXYCODONE HCL 15 MG PO TABS: ORAL_TABLET | ORAL | Status: DC

## 2015-07-25 MED ORDER — NITROFURANTOIN MONOHYD MACRO 100 MG PO CAPS: 100.0000 mg | ORAL_CAPSULE | Freq: Two times a day (BID) | ORAL | Status: DC

## 2015-07-25 MED ORDER — DIAZEPAM 5 MG PO TABS: 5.0000 mg | ORAL_TABLET | Freq: Three times a day (TID) | ORAL | Status: DC | PRN

## 2015-07-25 MED ORDER — TRAZODONE HCL 50 MG PO TABS: 25.0000 mg | ORAL_TABLET | Freq: Every evening | ORAL | Status: DC | PRN

## 2015-07-25 MED ORDER — POTASSIUM CHLORIDE CRYS ER 20 MEQ PO TBCR: EXTENDED_RELEASE_TABLET | ORAL | Status: DC

## 2015-07-25 MED ORDER — ZOLPIDEM TARTRATE 5 MG PO TABS: 5.0000 mg | ORAL_TABLET | Freq: Every evening | ORAL | Status: DC | PRN

## 2015-07-25 MED ORDER — MOMETASONE FUROATE 50 MCG/ACT NA SUSP: 2.0000 | Freq: Every day | NASAL | Status: DC

## 2015-07-25 MED ORDER — ESOMEPRAZOLE MAGNESIUM 40 MG PO CPDR: 40.0000 mg | DELAYED_RELEASE_CAPSULE | Freq: Every day | ORAL | Status: DC

## 2015-07-25 MED ORDER — MELOXICAM 7.5 MG PO TABS: 7.5000 mg | ORAL_TABLET | Freq: Every day | ORAL | Status: DC

## 2015-07-25 NOTE — Patient Instructions (Addendum)
PLAN   Continue present medication Zanaflex  and oxycodone  F/U PCP Dr.Sowles for evaluation of blood pressure and general medical condition .   F/U cardiologist   Dr.Gollan.   Psych follow-up evaluation as discussed.    F/U vascular evaluation The patient will follow-up with Dr. Lucky Cowboy  F/U surgical evaluation as discussed  F/U urological evaluation as discussed   F/U neurological evaluation. May consider pending follow-up evaluation  F/U wound care clinic as discussed   May consider radiofrequency rhizolysis or intraspinal procedures pending response to present treatment and F/U evaluation . We will avoid interventional treatment at this time  Patient to call [facility] should patient have concerns prior to scheduled return appointment.

## 2015-07-25 NOTE — Addendum Note (Signed)
Addended by: Steele Sizer F on: 07/25/2015 11:19 AM   Modules accepted: Orders, SmartSet

## 2015-07-25 NOTE — Progress Notes (Signed)
Subjective:    Patient ID: Jacqueline Campbell, female    DOB: 01-Aug-1960, 55 y.o.   MRN: [id number]  HPI The patient is a 55 year old female who returns to pain management for further evaluation and treatment of pain involving the neck associated with headaches as well as pain involving the upper mid lower back and lower extremity regions. The patient has had some return of headaches in pain involving the neck and upper back region as well as the lower back region. At the present time we will avoid interventional treatment and will consider patient for interventional treatment pending follow-up evaluations. The patient will continue the care of her cardiologist and primary care physician Dr.Gollan and Dr.Sowles respectively at this time. We will consider modification of treatment regimen including interventional treatment pending follow-up evaluations. Patient states the pain is aggravated with standing walking and becomes more intense as patient spends more time on the feet pain of the neck and upper extremity region is exacerbated by reaching and lifting pushing pulling maneuvers. Patient denies trauma change in events of daily living cause change in symptomatology and we will continue Zanaflex and oxycodone as prescribed. All agreed to suggested treatment plan      Review of Systems     Objective:   Physical Exam   There was tenderness of the splenius capitis and occipitalis musculature region a moderate degree with moderate tenderness over the cervical facet cervical paraspinal musculature region as well as moderate tenderness of the acromial regular and glenohumeral joint regions. The patient was with limited range of motion of the shoulders and was with difficulty performing drop test. Tinel and Phalen's maneuver associated with mild increased pain with slightly decreased grip strength. There was tenderness over the thoracic facet thoracic paraspinal musculature with no crepitus of the thoracic  region noted. Palpation over the lumbar region lumbar paraspinal musculature region reproduced pain of moderate moderate degree with lateral bending rotation extension and palpation of the lumbar facets reproducing moderate discomfort.. There was tends to palpation over the lumbar facet lumbar paraspinal musculature region of moderate degree with moderate tenderness over the PSIS and PII S region with moderate tenderness of the greater trochanteric region iliotibial band region. Straight leg raise was tolerates 20 without a definite increase of pain with dorsiflexion noted. DTRs were trace at the knees EHL strength was decreased. There was negative clonus negative Homans. 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   F/U cardiologist   Dr.Gollan.   Psych follow-up evaluation as discussed.    F/U vascular evaluation The patient will follow-up with Dr. Lucky Cowboy  F/U surgical evaluation as discussed  F/U urological evaluation as discussed   F/U neurological evaluation. May consider pending follow-up evaluation  F/U wound care clinic as discussed   May consider radiofrequency rhizolysis or intraspinal procedures pending response to present treatment and F/U evaluation . We will avoid interventional treatment at this time  Patient to call [facility] Center should patient have  concerns prior to scheduled return appointment.
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
## 2015-07-25 NOTE — Progress Notes (Signed)
Safety precautions to be maintained throughout the outpatient stay will include: orient to surroundings, keep bed in low position, maintain call bell within reach at all times, provide assistance with transfer out of bed and ambulation.  

## 2015-07-26 LAB — URINE CULTURE

## 2015-08-03 LAB — TOXASSURE SELECT 13 (MW), URINE

## 2015-08-26 ENCOUNTER — Ambulatory Visit: Payer: Medicare Other | Attending: Pain Medicine | Admitting: Pain Medicine

## 2015-08-26 ENCOUNTER — Encounter: Payer: Self-pay | Admitting: Pain Medicine

## 2015-08-26 VITALS — BP 133/85 | HR 99 | Temp 98.2°F | Resp 16 | Ht 67.5 in | Wt 257.0 lb

## 2015-08-26 DIAGNOSIS — M5126 Other intervertebral disc displacement, lumbar region: Secondary | ICD-10-CM | POA: Insufficient documentation

## 2015-08-26 DIAGNOSIS — M5136 Other intervertebral disc degeneration, lumbar region: Secondary | ICD-10-CM | POA: Insufficient documentation

## 2015-08-26 DIAGNOSIS — M51369 Other intervertebral disc degeneration, lumbar region without mention of lumbar back pain or lower extremity pain: Secondary | ICD-10-CM

## 2015-08-26 DIAGNOSIS — M533 Sacrococcygeal disorders, not elsewhere classified: Secondary | ICD-10-CM | POA: Diagnosis not present

## 2015-08-26 DIAGNOSIS — M5030 Other cervical disc degeneration, unspecified cervical region: Secondary | ICD-10-CM | POA: Diagnosis not present

## 2015-08-26 DIAGNOSIS — M5481 Occipital neuralgia: Secondary | ICD-10-CM

## 2015-08-26 DIAGNOSIS — M961 Postlaminectomy syndrome, not elsewhere classified: Secondary | ICD-10-CM

## 2015-08-26 DIAGNOSIS — M47816 Spondylosis without myelopathy or radiculopathy, lumbar region: Secondary | ICD-10-CM | POA: Insufficient documentation

## 2015-08-26 DIAGNOSIS — M170 Bilateral primary osteoarthritis of knee: Secondary | ICD-10-CM | POA: Diagnosis not present

## 2015-08-26 DIAGNOSIS — R51 Headache: Secondary | ICD-10-CM | POA: Insufficient documentation

## 2015-08-26 DIAGNOSIS — M542 Cervicalgia: Secondary | ICD-10-CM | POA: Diagnosis present

## 2015-08-26 DIAGNOSIS — M5134 Other intervertebral disc degeneration, thoracic region: Secondary | ICD-10-CM

## 2015-08-26 MED ORDER — OXYCODONE HCL 15 MG PO TABS: ORAL_TABLET | ORAL | Status: DC

## 2015-08-26 MED ORDER — TIZANIDINE HCL 4 MG PO TABS: ORAL_TABLET | ORAL | Status: DC

## 2015-08-26 NOTE — Patient Instructions (Signed)
PLAN   Continue present medication Zanaflex  and oxycodone  F/U PCP Dr.Sowles for evaluation of blood pressure and general medical condition .   F/U cardiologist   Dr.Gollan.   Psych follow-up evaluation as discussed.    F/U vascular evaluation The patient will follow-up with Dr. Lucky Cowboy  F/U surgical evaluation as discussed  F/U urological evaluation as discussed   F/U neurological evaluation. May consider pending follow-up evaluation  F/U wound care clinic as discussed   May consider radiofrequency rhizolysis or intraspinal procedures pending response to present treatment and F/U evaluation . We will avoid interventional treatment at this time  Patient to call [facility] Management Center should patient have concerns prior to scheduled return appointment.

## 2015-08-26 NOTE — Progress Notes (Signed)
   Subjective:    Patient ID: Jacqueline Campbell, female    DOB: 11-Aug-1960, 55 y.o.   MRN: [id number]  HPI  The patient is a 55 year old female who returns to pain management for further evaluation and treatment of pain involving the neck associated with headaches as well as pain involving the upper extremities mid back and lower back lower extremity region. The patient is without any significant change in symptoms and states that the pain of the neck is associated with some right upper extremity weakness and headaches. The patient states that the mid and lower back pain is aggravated by standing walking twisting turning maneuvers and becomes more intense as the day progresses. We discussed patient's condition and the patient will undergo further evaluation of her GI condition at this time. We will continue Zanaflex and oxycodone. The patient denies any trauma or change in events of daily living the call significant change in symptomatology.  Review of Systems     Objective:   Physical Exam  There was tenderness to palpation of the paraspinal musculature region of the cervical region cervical facet region Palpation of the splenius capitis and occipitalis regions reproduced moderate discomfort. Palpation of the thoracic region thoracic facet region was attends to palpation of moderate degree. There was moderate tenderness over the lumbar paraspinal musculatures and lumbar facet region. Lateral bending rotation extension and palpation of the lumbar facets reproduce moderate discomfort. No crepitus of the thoracic region was noted. There was tenderness over the region of the PSIS and PII S region a moderate degree. Straight leg raising was tolerated to 20 without increased pain with dorsiflexion noted. There was tenderness of the greater trochanteric region iliotibial band region of moderate degree. EHL strength appeared to be decreased. No definite sensory deficit or dermatomal distribution detected. The  knees were tenderness to palpation with crepitus of the knees with negative anterior and posterior drawer signs there was negative clonus negative Homans. Abdomen was nontender with no costovertebral angle tenderness Abdomen was mild to moderate tenderness to palpation with no costovertebral tenderness noted.       Assessment & Plan:    Degenerative disc disease lumbar spine Degenerative changes of the lumbar spine with L4-L5 degenerative changes and central disc protrusion  Degenerative joint disease of knees  Degenerative disc disease of the cervical spine C5-6 right-sided involvement most progressed with compression of the C5 nerve root  Sacroiliac joint dysfunction  Lumbar facet syndrome      PLAN   Continue present medication Zanaflex  and oxycodone  F/U PCP Dr.Sowles for evaluation of blood pressure and general medical condition .   F/U cardiologist   Dr.Gollan.   Psych follow-up evaluation as discussed.    F/U vascular evaluation The patient will follow-up with Dr. Lucky Cowboy  F/U GI evaluation as planned  F/U surgical evaluation as discussed  F/U urological evaluation as discussed   F/U neurological evaluation. May consider pending follow-up evaluation  F/U wound care clinic as discussed   May consider radiofrequency rhizolysis or intraspinal procedures pending response to present treatment and F/U evaluation . We will avoid interventional treatment at this time  Patient to call Pain Management Center should patient have concerns prior to scheduled return appointment.

## 2015-08-26 NOTE — Progress Notes (Signed)
Patient here for medication management Safety precautions to be maintained throughout the outpatient stay will include: orient to surroundings, keep bed in low position, maintain call bell within reach at all times, provide assistance with transfer out of bed and ambulation.  

## 2015-09-03 ENCOUNTER — Ambulatory Visit: Payer: Medicare Other | Admitting: Psychiatry

## 2015-09-09 ENCOUNTER — Other Ambulatory Visit: Payer: Self-pay

## 2015-09-09 DIAGNOSIS — F411 Generalized anxiety disorder: Secondary | ICD-10-CM

## 2015-09-09 NOTE — Telephone Encounter (Signed)
Got a fax from [facility] requesting a refill of this patient's Diazepam 5mg .  Refill request was sent to Dr. Steele Sizer for approval and submission.

## 2015-09-12 ENCOUNTER — Ambulatory Visit: Payer: Medicare Other | Admitting: Cardiovascular Disease

## 2015-09-12 MED ORDER — DIAZEPAM 5 MG PO TABS: 5.0000 mg | ORAL_TABLET | Freq: Three times a day (TID) | ORAL | Status: DC | PRN

## 2015-09-24 ENCOUNTER — Encounter: Payer: Self-pay | Admitting: Pain Medicine

## 2015-09-24 ENCOUNTER — Ambulatory Visit: Payer: Medicare Other | Attending: Pain Medicine | Admitting: Pain Medicine

## 2015-09-24 VITALS — BP 135/86 | HR 95 | Temp 98.3°F | Resp 16 | Ht 67.0 in | Wt 256.0 lb

## 2015-09-24 DIAGNOSIS — M533 Sacrococcygeal disorders, not elsewhere classified: Secondary | ICD-10-CM

## 2015-09-24 DIAGNOSIS — M961 Postlaminectomy syndrome, not elsewhere classified: Secondary | ICD-10-CM

## 2015-09-24 DIAGNOSIS — M5136 Other intervertebral disc degeneration, lumbar region: Secondary | ICD-10-CM | POA: Diagnosis not present

## 2015-09-24 DIAGNOSIS — M5481 Occipital neuralgia: Secondary | ICD-10-CM

## 2015-09-24 DIAGNOSIS — M170 Bilateral primary osteoarthritis of knee: Secondary | ICD-10-CM | POA: Diagnosis not present

## 2015-09-24 DIAGNOSIS — M5126 Other intervertebral disc displacement, lumbar region: Secondary | ICD-10-CM | POA: Diagnosis not present

## 2015-09-24 DIAGNOSIS — M51369 Other intervertebral disc degeneration, lumbar region without mention of lumbar back pain or lower extremity pain: Secondary | ICD-10-CM

## 2015-09-24 DIAGNOSIS — M19072 Primary osteoarthritis, left ankle and foot: Secondary | ICD-10-CM

## 2015-09-24 DIAGNOSIS — M542 Cervicalgia: Secondary | ICD-10-CM | POA: Diagnosis present

## 2015-09-24 DIAGNOSIS — M5030 Other cervical disc degeneration, unspecified cervical region: Secondary | ICD-10-CM | POA: Diagnosis not present

## 2015-09-24 DIAGNOSIS — M47816 Spondylosis without myelopathy or radiculopathy, lumbar region: Secondary | ICD-10-CM | POA: Insufficient documentation

## 2015-09-24 DIAGNOSIS — R51 Headache: Secondary | ICD-10-CM | POA: Diagnosis present

## 2015-09-24 DIAGNOSIS — M5134 Other intervertebral disc degeneration, thoracic region: Secondary | ICD-10-CM

## 2015-09-24 MED ORDER — OXYCODONE HCL 15 MG PO TABS: ORAL_TABLET | ORAL | Status: DC

## 2015-09-24 MED ORDER — TIZANIDINE HCL 4 MG PO TABS: ORAL_TABLET | ORAL | Status: DC

## 2015-09-24 NOTE — Progress Notes (Signed)
Safety precautions to be maintained throughout the outpatient stay will include: orient to surroundings, keep bed in low position, maintain call bell within reach at all times, provide assistance with transfer out of bed and ambulation.  

## 2015-09-24 NOTE — Progress Notes (Signed)
Subjective:    Patient ID: Jacqueline Campbell, female    DOB: 07-16-1960, 55 y.o.   MRN: [id number]  HPI  The patient is a 55 year old female who returns to pain management for further evaluation and treatment of pain involving the neck associated with headaches as well as pain involving the upper mid lower back lower extremity regions. The patient is a pain involving the knees as well and the present time we will avoid interventional treatment. The patient will undergo further evaluation of her general medical condition at this time. The patient does admit to pain of the cervical region with pain cervical region radiating to the back of the head and continuing towards the area behind the eyes. The patient stated the most bothersome pain involves the left ankle without known trauma The patient has had some pain of the mid and lower back region which is increased as well. We will remain available to consider patient for interventional treatment as well as additional modifications of treatment pending follow-up evaluation. All agreed to suggested treatment plan the patient will continue Zanaflex and oxycodone as prescribed this time  Review of Systems     Objective:   Physical Exam  There was tenderness of the splenius capitis and occipitalis region palpation which be produced moderate discomfort with moderate tenderness of the cervical facet cervical paraspinal musculature region. No masses of the head and neck were noted. There was mild tenderness of the temporomandibular joint region with no bounding pulsations of the temporal region noted. Palpation over the thoracic region was attends to palpation without crepitus of the thoracic region noted. Palpation over the lumbar region was with moderate discomfort with lateral bending rotation extension and palpation over the lumbar facets reproducing moderate discomfort. There was tenderness over the region of the PSIS and PII S region a moderate degree with  mild tenderness of the greater trochanteric region iliotibial band region. Straight leg raising was tolerates approximately 20 without increase of pain with dorsiflexion noted. EHL strength appeared to be decreased without sensory deficit or dermatomal distribution detected. DTRs were difficult to elicit patient had difficulty relaxing. There was moderate tenderness to the lateral aspect of the left ankle without definite point tenderness noted there was decreased range of motion of the left ankle compared to the right ankle The knees were attends to palpation with crepitus of the knees with negative anterior and posterior drawer signs without ballottement of the patella. There was negative clonus negative Homans. 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  F/U cardiologist   Dr.Gollan.   Psych follow-up evaluation as discussed.    F/U vascular evaluation The patient will follow-up with Dr. Lucky Cowboy  F/U surgical evaluation as discussed  F/U urological evaluation as discussed  F/U neurological evaluation. May consider pending follow-up evaluation  F/U wound care clinic as discussed   MRI of left ankle  to evaluate for tears fractures and other abnormalities  Ask the nurses and secretary the date of your MRI of the  left ankle  May consider radiofrequency rhizolysis or intraspinal procedures pending response to present treatment and F/U evaluation . We will avoid interventional treatment at this time.  Patient to call [facility] Center should patient have concerns prior to scheduled return appointment.

## 2015-09-24 NOTE — Patient Instructions (Addendum)
PLAN   Continue present medication Zanaflex  and oxycodone  F/U PCP Dr.Sowles for evaluation of blood pressure and general medical condition .   F/U cardiologist   Dr.Gollan.   Psych follow-up evaluation as discussed.    F/U vascular evaluation The patient will follow-up with Dr. Lucky Cowboy  F/U surgical evaluation as discussed  F/U urological evaluation as discussed  F/U neurological evaluation. May consider pending follow-up evaluation  F/U wound care clinic as discussed   Ask the nurses and secretary the date of your MRI of the left ankle  May consider radiofrequency rhizolysis or intraspinal procedures pending response to present treatment and F/U evaluation . We will avoid interventional treatment at this time  Patient to call Pain Management Center should patient have concerns prior to scheduled return appointment.Pain Management Discharge Instructions  General Discharge Instructions :  If you need to reach your doctor call: Monday-Friday 8:00 am - 4:00 pm at [phone] or toll free [phone].  After clinic hours [phone] to have operator reach doctor.  Bring all of your medication bottles to all your appointments in the pain clinic.  To cancel or reschedule your appointment with Pain Management please remember to call 24 hours in advance to avoid a fee.  Refer to the educational materials which you have been given on: General Risks, I had my Procedure. Discharge Instructions, Post Sedation.  Post Procedure Instructions:  The drugs you were given will stay in your system until tomorrow, so for the next 24 hours you should not drive, make any legal decisions or drink any alcoholic beverages.  You may eat anything you prefer, but it is better to start with liquids then soups and crackers, and gradually work up to solid foods.  Please notify your doctor immediately if you have any unusual bleeding, trouble breathing or pain that is not related to your normal  pain.  Depending on the type of procedure that was done, some parts of your body may feel week and/or numb.  This usually clears up by tonight or the next day.  Walk with the use of an assistive device or accompanied by an adult for the 24 hours.  You may use ice on the affected area for the first 24 hours.  Put ice in a Ziploc bag and cover with a towel and place against area 15 minutes on 15 minutes off.  You may switch to heat after 24 hours.

## 2015-09-27 ENCOUNTER — Ambulatory Visit: Payer: Medicare Other

## 2015-10-08 ENCOUNTER — Other Ambulatory Visit: Payer: Self-pay

## 2015-10-08 ENCOUNTER — Ambulatory Visit: Admission: RE | Admit: 2015-10-08 | Payer: Medicare Other | Source: Ambulatory Visit

## 2015-10-08 DIAGNOSIS — A6009 Herpesviral infection of other urogenital tract: Secondary | ICD-10-CM

## 2015-10-08 DIAGNOSIS — N3010 Interstitial cystitis (chronic) without hematuria: Secondary | ICD-10-CM

## 2015-10-08 DIAGNOSIS — F411 Generalized anxiety disorder: Secondary | ICD-10-CM

## 2015-10-08 MED ORDER — DIAZEPAM 5 MG PO TABS: 5.0000 mg | ORAL_TABLET | Freq: Three times a day (TID) | ORAL | 0 refills | Status: DC | PRN

## 2015-10-08 MED ORDER — VALACYCLOVIR HCL 500 MG PO TABS: 500.0000 mg | ORAL_TABLET | ORAL | 12 refills | Status: AC

## 2015-10-08 NOTE — Telephone Encounter (Signed)
Patient requesting refill. 

## 2015-10-18 ENCOUNTER — Ambulatory Visit
Admission: RE | Admit: 2015-10-18 | Discharge: 2015-10-18 | Disposition: A | Discharge status: Home / Self Care | Payer: Medicare Other | Source: Ambulatory Visit | Attending: Pain Medicine | Admitting: Pain Medicine

## 2015-10-18 DIAGNOSIS — M170 Bilateral primary osteoarthritis of knee: Secondary | ICD-10-CM

## 2015-10-18 DIAGNOSIS — M5136 Other intervertebral disc degeneration, lumbar region: Secondary | ICD-10-CM | POA: Diagnosis not present

## 2015-10-18 DIAGNOSIS — G8929 Other chronic pain: Secondary | ICD-10-CM | POA: Diagnosis not present

## 2015-10-18 DIAGNOSIS — M5481 Occipital neuralgia: Secondary | ICD-10-CM | POA: Diagnosis not present

## 2015-10-18 DIAGNOSIS — M5030 Other cervical disc degeneration, unspecified cervical region: Secondary | ICD-10-CM | POA: Diagnosis not present

## 2015-10-18 DIAGNOSIS — M5134 Other intervertebral disc degeneration, thoracic region: Secondary | ICD-10-CM

## 2015-10-18 DIAGNOSIS — M899 Disorder of bone, unspecified: Secondary | ICD-10-CM | POA: Insufficient documentation

## 2015-10-18 DIAGNOSIS — S93492A Sprain of other ligament of left ankle, initial encounter: Secondary | ICD-10-CM | POA: Insufficient documentation

## 2015-10-18 DIAGNOSIS — M19072 Primary osteoarthritis, left ankle and foot: Secondary | ICD-10-CM

## 2015-10-18 DIAGNOSIS — M25572 Pain in left ankle and joints of left foot: Secondary | ICD-10-CM | POA: Insufficient documentation

## 2015-10-18 DIAGNOSIS — M533 Sacrococcygeal disorders, not elsewhere classified: Secondary | ICD-10-CM

## 2015-10-18 DIAGNOSIS — M51369 Other intervertebral disc degeneration, lumbar region without mention of lumbar back pain or lower extremity pain: Secondary | ICD-10-CM

## 2015-10-18 DIAGNOSIS — M4328 Fusion of spine, sacral and sacrococcygeal region: Secondary | ICD-10-CM | POA: Insufficient documentation

## 2015-10-18 DIAGNOSIS — M961 Postlaminectomy syndrome, not elsewhere classified: Secondary | ICD-10-CM

## 2015-10-21 ENCOUNTER — Telehealth: Payer: Self-pay | Admitting: *Deleted

## 2015-10-21 NOTE — Telephone Encounter (Signed)
Left ankle MRI results relayed to the patient with Dr. Ethel Rana recommendation to have surgical evaluation. Patient agrees to have evaluation. Appointment was made with Dr. Cleda Mccreedy (podiatry). Patient aware.

## 2015-10-21 NOTE — Progress Notes (Signed)
Patient informed of MRI results of left ankle and recommendations of Dr. Primus Bravo. Patient agrees to have a surgical evaluation done. An appointment was scheduled with Dr. Cleda Mccreedy (podiatry). Patient aware.

## 2015-10-21 NOTE — Telephone Encounter (Signed)
-----   Message from Mohammed Kindle, MD sent at 10/18/2015  9:58 PM EDT ----- Nurses, Please inform patient that she has tears of ligaments of the left ankle and other abnormalities.   Please schedule surgical evaluation of ankle if patient agrees. Thank you.

## 2015-10-24 ENCOUNTER — Encounter: Payer: Self-pay | Admitting: Pain Medicine

## 2015-10-24 ENCOUNTER — Ambulatory Visit: Payer: Medicare Other | Attending: Pain Medicine | Admitting: Pain Medicine

## 2015-10-24 VITALS — BP 134/93 | HR 87 | Temp 98.5°F | Resp 18 | Ht 67.5 in | Wt 255.0 lb

## 2015-10-24 DIAGNOSIS — M542 Cervicalgia: Secondary | ICD-10-CM | POA: Diagnosis present

## 2015-10-24 DIAGNOSIS — M47816 Spondylosis without myelopathy or radiculopathy, lumbar region: Secondary | ICD-10-CM | POA: Diagnosis not present

## 2015-10-24 DIAGNOSIS — M533 Sacrococcygeal disorders, not elsewhere classified: Secondary | ICD-10-CM

## 2015-10-24 DIAGNOSIS — M170 Bilateral primary osteoarthritis of knee: Secondary | ICD-10-CM | POA: Diagnosis not present

## 2015-10-24 DIAGNOSIS — M5030 Other cervical disc degeneration, unspecified cervical region: Secondary | ICD-10-CM | POA: Diagnosis not present

## 2015-10-24 DIAGNOSIS — M5134 Other intervertebral disc degeneration, thoracic region: Secondary | ICD-10-CM

## 2015-10-24 DIAGNOSIS — M5126 Other intervertebral disc displacement, lumbar region: Secondary | ICD-10-CM | POA: Diagnosis not present

## 2015-10-24 DIAGNOSIS — R51 Headache: Secondary | ICD-10-CM | POA: Diagnosis present

## 2015-10-24 DIAGNOSIS — X58XXXA Exposure to other specified factors, initial encounter: Secondary | ICD-10-CM | POA: Diagnosis not present

## 2015-10-24 DIAGNOSIS — M5136 Other intervertebral disc degeneration, lumbar region: Secondary | ICD-10-CM | POA: Diagnosis not present

## 2015-10-24 DIAGNOSIS — M5481 Occipital neuralgia: Secondary | ICD-10-CM | POA: Diagnosis not present

## 2015-10-24 DIAGNOSIS — S96912A Strain of unspecified muscle and tendon at ankle and foot level, left foot, initial encounter: Secondary | ICD-10-CM | POA: Diagnosis not present

## 2015-10-24 DIAGNOSIS — M51369 Other intervertebral disc degeneration, lumbar region without mention of lumbar back pain or lower extremity pain: Secondary | ICD-10-CM

## 2015-10-24 DIAGNOSIS — M961 Postlaminectomy syndrome, not elsewhere classified: Secondary | ICD-10-CM

## 2015-10-24 MED ORDER — DOXYCYCLINE HYCLATE 100 MG PO TABS: 100.0000 mg | ORAL_TABLET | Freq: Two times a day (BID) | ORAL | Status: DC

## 2015-10-24 MED ORDER — OXYCODONE HCL 15 MG PO TABS: ORAL_TABLET | ORAL | 0 refills | Status: DC

## 2015-10-24 MED ORDER — TIZANIDINE HCL 4 MG PO TABS: ORAL_TABLET | ORAL | 0 refills | Status: DC
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
## 2015-10-24 NOTE — Progress Notes (Signed)
Safety precautions to be maintained throughout the outpatient stay will include: orient to surroundings, keep bed in low position, maintain call bell within reach at all times, provide assistance with transfer out of bed and ambulation.  

## 2015-10-24 NOTE — Patient Instructions (Addendum)
PLAN   Continue present medication Zanaflex  and oxycodone  Greater occipital nerve block to be performed at time of return appointment  F/U PCP Dr.Sowles for evaluation of blood pressure and general medical condition .   F/U cardiologist   Dr.Gollan.   Psych follow-up evaluation as discussed.    F/U vascular evaluation The patient will follow-up with Dr. Lucky Cowboy  F/U surgical evaluation as discussed. Evaluation of left ankle and further surgical evaluation as scheduled  F/U urological evaluation as discussed  F/U neurological evaluation. May consider pending follow-up evaluation  F/U wound care clinic as discussed   May consider radiofrequency rhizolysis or intraspinal procedures pending response to present treatment and F/U evaluation . We will avoid interventional treatment at this time  Patient to call Pain Management Center should patient have concerns prior to scheduled return appointment.Occipital Nerve Block Patient Information  Description: The occipital nerves originate in the cervical (neck) spinal cord and travel upward through muscle and tissue to supply sensation to the back of the head and top of the scalp.  In addition, the nerves control some of the muscles of the scalp.  Occipital neuralgia is an irritation of these nerves which can cause headaches, numbness of the scalp, and neck discomfort.     The occipital nerve block will interrupt nerve transmission through these nerves and can relieve pain and spasm.  The block consists of insertion of a small needle under the skin in the back of the head to deposit local anesthetic (numbing medicine) and/or steroids around the nerve.  The entire block usually lasts less than 5 minutes.  Conditions which may be treated by occipital blocks:   Muscular pain and spasm of the scalp  Nerve irritation, back of the head  Headaches  Upper neck pain  Preparation for the injection:  1. Do not eat any solid food or dairy products  within 8 hours of your appointment. 2. You may drink clear liquids up to 3 hours before appointment.  Clear liquids include water, black coffee, juice or soda.  No milk or cream please. 3. You may take your regular medication, including pain medications, with a sip of water before you appointment.  Diabetics should hold regular insulin (if taken separately) and take 1/2 normal NPH dose the morning of the procedure.  Carry some sugar containing items with you to your appointment. 4. A driver must accompany you and be prepared to drive you home after your procedure. 5. Bring all your current medications with you. 6. An IV may be inserted and sedation may be given at the discretion of the physician. 7. A blood pressure cuff, EKG, and other monitors will often be applied during the procedure.  Some patients may need to have extra oxygen administered for a short period. 8. You will be asked to provide medical information, including your allergies and medications, prior to the procedure.  We must know immediately if you are taking blood thinners (like Coumadin/Warfarin) or if you are allergic to IV iodine contrast (dye).  We must know if you could possible be pregnant.  9. Do not wear a high collared shirt or turtleneck.  Tie long hair up in the back if possible.  Possible side-effects:   Bleeding from needle site  Infection (rare, may require surgery)  Nerve injury (rare)  Hair on back of neck can be tinged with iodine scrub (this will wash out)  Light-headedness (temporary)  Pain at injection site (several days)  Decreased blood pressure (rare, temporary)  Seizure (very rare)  Call if you experience:   Hives or difficulty breathing ( go to the emergency room)  Inflammation or drainage at the injection site(s)  Please note:  Although the local anesthetic injected can often make your painful muscles or headache feel good for several hours after the injection, the pain may return.  It  takes 3-7 days for steroids to work.  You may not notice any pain relief for at least one week.  If effective, we will often do a series of injections spaced 3-6 weeks apart to maximally decrease your pain.  If you have any questions, please call [phone] [facility]

## 2015-10-25 ENCOUNTER — Ambulatory Visit: Payer: Medicare Other | Admitting: Family Medicine

## 2015-10-30 ENCOUNTER — Ambulatory Visit: Payer: Medicare Other | Admitting: Pain Medicine

## 2015-11-07 ENCOUNTER — Other Ambulatory Visit: Payer: Self-pay | Admitting: Family Medicine

## 2015-11-07 ENCOUNTER — Telehealth: Payer: Self-pay

## 2015-11-07 NOTE — Telephone Encounter (Signed)
Dr. Rockey Situ gives her Torsemide  She must be seen for Valium

## 2015-11-07 NOTE — Telephone Encounter (Signed)
Patient requesting refill of Furosemide and Diazepam be sent to [facility].

## 2015-11-08 NOTE — Telephone Encounter (Signed)
LMOM on both #'s in our system

## 2015-11-08 NOTE — Telephone Encounter (Signed)
Did this patient ever make a appointment ?

## 2015-11-11 ENCOUNTER — Ambulatory Visit: Payer: Medicare Other | Admitting: Cardiovascular Disease

## 2015-11-12 ENCOUNTER — Telehealth: Payer: Self-pay | Admitting: *Deleted

## 2015-11-12 ENCOUNTER — Other Ambulatory Visit: Payer: Self-pay | Admitting: Family Medicine

## 2015-11-12 MED ORDER — VALSARTAN-HYDROCHLOROTHIAZIDE 320-25 MG PO TABS: 1.0000 | ORAL_TABLET | Freq: Every day | ORAL | 2 refills | Status: DC

## 2015-11-13 ENCOUNTER — Telehealth: Payer: Self-pay | Admitting: Family Medicine

## 2015-11-13 ENCOUNTER — Other Ambulatory Visit: Payer: Self-pay | Admitting: Family Medicine

## 2015-11-13 ENCOUNTER — Encounter: Payer: Self-pay | Admitting: Family Medicine

## 2015-11-13 ENCOUNTER — Ambulatory Visit (INDEPENDENT_AMBULATORY_CARE_PROVIDER_SITE_OTHER): Payer: Medicare Other | Admitting: Family Medicine

## 2015-11-13 VITALS — BP 124/88 | HR 108 | Temp 98.4°F | Resp 18 | Ht 68.0 in | Wt 261.1 lb

## 2015-11-13 DIAGNOSIS — K219 Gastro-esophageal reflux disease without esophagitis: Secondary | ICD-10-CM | POA: Diagnosis not present

## 2015-11-13 DIAGNOSIS — G4700 Insomnia, unspecified: Secondary | ICD-10-CM | POA: Diagnosis not present

## 2015-11-13 DIAGNOSIS — M25572 Pain in left ankle and joints of left foot: Secondary | ICD-10-CM

## 2015-11-13 DIAGNOSIS — I10 Essential (primary) hypertension: Secondary | ICD-10-CM

## 2015-11-13 DIAGNOSIS — M25562 Pain in left knee: Secondary | ICD-10-CM

## 2015-11-13 DIAGNOSIS — Z9989 Dependence on other enabling machines and devices: Secondary | ICD-10-CM

## 2015-11-13 DIAGNOSIS — G4733 Obstructive sleep apnea (adult) (pediatric): Secondary | ICD-10-CM | POA: Diagnosis not present

## 2015-11-13 DIAGNOSIS — M7989 Other specified soft tissue disorders: Secondary | ICD-10-CM | POA: Diagnosis not present

## 2015-11-13 DIAGNOSIS — J4540 Moderate persistent asthma, uncomplicated: Secondary | ICD-10-CM

## 2015-11-13 DIAGNOSIS — J3089 Other allergic rhinitis: Secondary | ICD-10-CM

## 2015-11-13 DIAGNOSIS — Z6841 Body Mass Index (BMI) 40.0 and over, adult: Secondary | ICD-10-CM

## 2015-11-13 DIAGNOSIS — A609 Anogenital herpesviral infection, unspecified: Secondary | ICD-10-CM | POA: Diagnosis not present

## 2015-11-13 DIAGNOSIS — A6009 Herpesviral infection of other urogenital tract: Secondary | ICD-10-CM

## 2015-11-13 DIAGNOSIS — G8929 Other chronic pain: Secondary | ICD-10-CM

## 2015-11-13 DIAGNOSIS — F411 Generalized anxiety disorder: Secondary | ICD-10-CM

## 2015-11-13 DIAGNOSIS — K5909 Other constipation: Secondary | ICD-10-CM

## 2015-11-13 DIAGNOSIS — I5030 Unspecified diastolic (congestive) heart failure: Secondary | ICD-10-CM

## 2015-11-13 DIAGNOSIS — E781 Pure hyperglyceridemia: Secondary | ICD-10-CM

## 2015-11-13 DIAGNOSIS — N3010 Interstitial cystitis (chronic) without hematuria: Secondary | ICD-10-CM

## 2015-11-13 DIAGNOSIS — J309 Allergic rhinitis, unspecified: Secondary | ICD-10-CM

## 2015-11-13 MED ORDER — MELOXICAM 7.5 MG PO TABS: 7.5000 mg | ORAL_TABLET | Freq: Every day | ORAL | 5 refills | Status: AC

## 2015-11-13 MED ORDER — LINACLOTIDE 145 MCG PO CAPS: 145.0000 ug | ORAL_CAPSULE | Freq: Every day | ORAL | 5 refills | Status: AC

## 2015-11-13 MED ORDER — DIAZEPAM 5 MG PO TABS: 5.0000 mg | ORAL_TABLET | Freq: Two times a day (BID) | ORAL | 0 refills | Status: AC | PRN

## 2015-11-13 MED ORDER — POTASSIUM CHLORIDE CRYS ER 20 MEQ PO TBCR: EXTENDED_RELEASE_TABLET | ORAL | 5 refills | Status: DC

## 2015-11-13 MED ORDER — PENTOSAN POLYSULFATE SODIUM 100 MG PO CAPS: 100.0000 mg | ORAL_CAPSULE | Freq: Three times a day (TID) | ORAL | 12 refills | Status: DC

## 2015-11-13 MED ORDER — MOMETASONE FUROATE 50 MCG/ACT NA SUSP: 2.0000 | Freq: Every day | NASAL | 5 refills | Status: AC

## 2015-11-13 MED ORDER — ACYCLOVIR 5 % EX OINT: 1.0000 "application " | TOPICAL_OINTMENT | Freq: Every day | CUTANEOUS | 5 refills | Status: AC | PRN

## 2015-11-13 MED ORDER — CLONIDINE HCL 0.1 MG/24HR TD PTWK: 0.1000 mg | MEDICATED_PATCH | TRANSDERMAL | 5 refills | Status: AC

## 2015-11-13 MED ORDER — ESOMEPRAZOLE MAGNESIUM 40 MG PO CPDR: 40.0000 mg | DELAYED_RELEASE_CAPSULE | Freq: Every day | ORAL | 5 refills | Status: AC

## 2015-11-13 MED ORDER — NADOLOL 40 MG PO TABS: 40.0000 mg | ORAL_TABLET | Freq: Every day | ORAL | 5 refills | Status: DC

## 2015-11-13 MED ORDER — MONTELUKAST SODIUM 10 MG PO TABS: 10.0000 mg | ORAL_TABLET | Freq: Every day | ORAL | 5 refills | Status: AC

## 2015-11-13 NOTE — Telephone Encounter (Signed)
I will give her 60 pills of Valium to last until her follow up in [place], but she is also being dismissed from our practice because the way she acted in our office today
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
## 2015-11-20 ENCOUNTER — Encounter: Payer: Self-pay | Admitting: Pain Medicine

## 2015-11-20 ENCOUNTER — Ambulatory Visit: Payer: Medicare Other | Attending: Pain Medicine | Admitting: Pain Medicine

## 2015-11-20 VITALS — BP 134/70 | HR 92 | Temp 98.2°F | Resp 16 | Ht 67.0 in | Wt 258.0 lb

## 2015-11-20 DIAGNOSIS — M533 Sacrococcygeal disorders, not elsewhere classified: Secondary | ICD-10-CM | POA: Diagnosis not present

## 2015-11-20 DIAGNOSIS — M5126 Other intervertebral disc displacement, lumbar region: Secondary | ICD-10-CM | POA: Insufficient documentation

## 2015-11-20 DIAGNOSIS — R51 Headache: Secondary | ICD-10-CM | POA: Diagnosis present

## 2015-11-20 DIAGNOSIS — M5030 Other cervical disc degeneration, unspecified cervical region: Secondary | ICD-10-CM | POA: Diagnosis not present

## 2015-11-20 DIAGNOSIS — S96912A Strain of unspecified muscle and tendon at ankle and foot level, left foot, initial encounter: Secondary | ICD-10-CM | POA: Diagnosis not present

## 2015-11-20 DIAGNOSIS — M170 Bilateral primary osteoarthritis of knee: Secondary | ICD-10-CM

## 2015-11-20 DIAGNOSIS — X58XXXA Exposure to other specified factors, initial encounter: Secondary | ICD-10-CM | POA: Diagnosis not present

## 2015-11-20 DIAGNOSIS — M47816 Spondylosis without myelopathy or radiculopathy, lumbar region: Secondary | ICD-10-CM | POA: Insufficient documentation

## 2015-11-20 DIAGNOSIS — M5134 Other intervertebral disc degeneration, thoracic region: Secondary | ICD-10-CM

## 2015-11-20 DIAGNOSIS — M542 Cervicalgia: Secondary | ICD-10-CM | POA: Diagnosis present

## 2015-11-20 DIAGNOSIS — M51369 Other intervertebral disc degeneration, lumbar region without mention of lumbar back pain or lower extremity pain: Secondary | ICD-10-CM

## 2015-11-20 DIAGNOSIS — M5481 Occipital neuralgia: Secondary | ICD-10-CM | POA: Insufficient documentation

## 2015-11-20 DIAGNOSIS — M5136 Other intervertebral disc degeneration, lumbar region: Secondary | ICD-10-CM

## 2015-11-20 MED ORDER — TIZANIDINE HCL 4 MG PO TABS: ORAL_TABLET | ORAL | 0 refills | Status: DC

## 2015-11-20 MED ORDER — OXYCODONE HCL 15 MG PO TABS: ORAL_TABLET | ORAL | 0 refills | Status: AC
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
## 2015-11-20 NOTE — Progress Notes (Signed)
Safety precautions to be maintained throughout the outpatient stay will include: orient to surroundings, keep bed in low position, maintain call bell within reach at all times, provide assistance with transfer out of bed and ambulation.  

## 2015-11-20 NOTE — Patient Instructions (Signed)
PLAN   Continue present medication Zanaflex  and oxycodone  F/U PCP Dr.Sowles for evaluation of blood pressure and general medical condition .   F/U cardiologist   Dr.Gollan.   Psych follow-up evaluation as discussed.    F/U vascular evaluation The patient will follow-up with Dr. Lucky Cowboy  F/U surgical evaluation as discussed. Evaluation of left ankle and further surgical evaluation as scheduled  F/U urological evaluation as discussed  F/U neurological evaluation. May consider pending follow-up evaluation  F/U wound care clinic as discussed   May consider radiofrequency rhizolysis or intraspinal procedures pending response to present treatment and F/U evaluation . We will avoid interventional treatment at this time  Patient to call [facility] Management Center should patient have concerns prior to scheduled return appointment.

## 2015-11-21 ENCOUNTER — Ambulatory Visit: Payer: Medicare Other | Admitting: Pain Medicine

## 2015-11-21 ENCOUNTER — Encounter: Payer: Self-pay | Admitting: Physician Assistant

## 2015-11-26 NOTE — Progress Notes (Deleted)
Cardiology Office Note Date:  11/26/2015  Patient ID:  Jacqueline Campbell, Jacqueline Campbell 07/15/1960, MRN [id number] PCP:  Loistine Chance, MD  Cardiologist:  Dr. Rockey Situ, MD  ***refresh   Chief Complaint: Follow up diastolic CHF  History of Present Illness: Jacqueline Campbell is a 55 y.o. female with history of chronic diastolic CHF, LE swelling on lymphedema compression pump, morbid obeisty, OSA on CPAP, GAD, chronic cystitis, multiple medication intolerances, asthma, cervical cancer, and metabolic syndrome with poorly controlled BP who presents for follow up of her diastolic HF.   She was last seen by Dr. Rockey Situ on 06/14/15 with a weight of 281 pounds, which was not a significant change from her prior office visit. She felt like her dry weight should be around 220 pounds. There was no significant improvement in LE swelling dispite diuresis. Her Lasix was changed to torsemide. It was felt many of her issues were 2/2 morbid obesity. She did not want to titrate her antihypertensives at that time. Most recent echo from 12/2014 showed an EF of 60-65%, no RWMA, GR2DD, left atrium was mildly dilated, RV was mildly dilated.    Past Medical History:  Diagnosis Date  . Asthma   . Bladder incontinence   . Cancer ([facility]) 1992   cervical  . Chronic diastolic CHF (congestive heart failure) ([place])    a. echo 12/2014: EF 60-65%, no RWMA, GR2DD, RV mildly dilated  . Chronic insomnia   . Chronic interstitial cystitis   . GERD (gastroesophageal reflux disease)   . Glaucoma   . Hypertension   . Metabolic syndrome   . Mood disorder ([place])   . Nausea    Chronic  . Obesity   . Ovarian cancer ([place]) 1994  . Plantar fasciitis   . Throat cancer ([facility]) 1998    Past Surgical History:  Procedure Laterality Date  . ABDOMINAL HYSTERECTOMY  1992  . BLADDER SURGERY     multiple  . BREAST BIOPSY Right   . EYE SURGERY Bilateral 2006, 2008, 2010  . FOOT SURGERY Bilateral 1996  . KNEE SURGERY Left    age 55  . TONSILLECTOMY  AND ADENOIDECTOMY  1994  . TUBAL LIGATION  1989    Current Outpatient Prescriptions  Medication Sig Dispense Refill  . acyclovir ointment (ZOVIRAX) 5 % Apply 1 application topically 5 (five) times daily as needed. 30 g 5  . albuterol (PROVENTIL) (2.5 MG/3ML) 0.083% nebulizer solution Take 2.5 mg by nebulization every 6 (six) hours as needed for wheezing or shortness of breath.    . cloNIDine (CATAPRES - DOSED IN MG/24 HR) 0.1 mg/24hr patch Place 1 patch (0.1 mg total) onto the skin once a week. 4 patch 5  . diazepam (VALIUM) 5 MG tablet Take 1 tablet (5 mg total) by mouth every 12 (twelve) hours as needed for anxiety. 60 tablet 0  . dorzolamide (TRUSOPT) 2 % ophthalmic solution Place 1 drop into both eyes 3 (three) times daily.    Marland Kitchen EPINEPHRINE IJ Inject as directed as needed. Epi pen    . esomeprazole (NEXIUM) 40 MG capsule Take 1 capsule (40 mg total) by mouth daily before breakfast. 30 capsule 5  . Fluticasone-Salmeterol (ADVAIR) 500-50 MCG/DOSE AEPB Inhale 1 puff into the lungs every 12 (twelve) hours.    Marland Kitchen ipratropium-albuterol (DUONEB) 0.5-2.5 (3) MG/3ML SOLN Take 3 mLs by nebulization 2 (two) times daily.    Marland Kitchen linaclotide (LINZESS) 145 MCG CAPS capsule Take 1 capsule (145 mcg total) by mouth daily. 30 capsule  5  . loratadine (CLARITIN) 10 MG tablet TAKE ONE (1) TABLET EACH DAY 30 tablet 5  . meloxicam (MOBIC) 7.5 MG tablet Take 1 tablet (7.5 mg total) by mouth daily. 30 tablet 5  . mometasone (NASONEX) 50 MCG/ACT nasal spray Place 2 sprays into the nose daily. 30 g 5  . montelukast (SINGULAIR) 10 MG tablet Take 1 tablet (10 mg total) by mouth at bedtime. 30 tablet 5  . nadolol (CORGARD) 40 MG tablet Take 1 tablet (40 mg total) by mouth at bedtime. 30 tablet 5  . oxyCODONE (ROXICODONE) 15 MG immediate release tablet Limit 1 tab by mouth 3 - 4  times per day if tolerated 120 tablet 0  . pentosan polysulfate (ELMIRON) 100 MG capsule Take 1 capsule (100 mg total) by mouth 3 (three) times  daily before meals. 90 capsule 12  . potassium chloride SA (K-DUR,KLOR-CON) 20 MEQ tablet TAKE ONE (1) TABLET EACH DAY 30 tablet 5  . promethazine (PHENERGAN) 25 MG tablet Take 1 tablet (25 mg total) by mouth every 6 (six) hours as needed for nausea. 30 tablet 2  . tiZANidine (ZANAFLEX) 4 MG tablet Limit 1 tablet by mouth 2 -4  times per day if tolerated 100 tablet 0  . torsemide (DEMADEX) 20 MG tablet Take 3 tablets (60 mg total) by mouth daily. 180 tablet 6  . traZODone (DESYREL) 50 MG tablet Take 0.5-1.5 tablets (25-75 mg total) by mouth at bedtime as needed for sleep. 45 tablet 5  . valACYclovir (VALTREX) 500 MG tablet Take 1 tablet (500 mg total) by mouth 1 day or 1 dose. 30 tablet 12  . valsartan-hydrochlorothiazide (DIOVAN-HCT) 320-25 MG tablet Take 1 tablet by mouth daily. 30 tablet 2  . zolpidem (AMBIEN) 5 MG tablet Take 1 tablet (5 mg total) by mouth at bedtime as needed for sleep. 30 tablet 2   Current Facility-Administered Medications  Medication Dose Route Frequency Provider Last Rate Last Dose  . orphenadrine (NORFLEX) injection 60 mg  60 mg Intramuscular Once Mohammed Kindle, MD        Allergies:   Red dye; Abilify [aripiprazole]; Amitiza [lubiprostone]; Benadryl [diphenhydramine hcl]; Doxycycline; Gabapentin; Iohexol; Lac bovis; Risperidone and related; Shellfish allergy; Strawberry (diagnostic); Strawberry extract; Aspirin; Ciprofloxacin; Enablex [darifenacin]; Iodine; Latex; Levofloxacin; Penicillins; Risperidone; and Sulfa antibiotics   Social History:  The patient  reports that she has never smoked. She has never used smokeless tobacco. She reports that she does not drink alcohol or use drugs.   Family History:  The patient's family history includes Breast cancer in her mother; COPD in her mother; Cancer in her father; Diabetes in her mother; Heart disease in her father and mother; Hypertension in her mother; Leukemia in her son; Prostate cancer in her brother; Stroke in her  mother.  ROS:   ROS   PHYSICAL EXAM: *** VS:  There were no vitals taken for this visit. BMI: There is no height or weight on file to calculate BMI.  Physical Exam   EKG:  Was ordered and interpreted by me today. Shows ***  Recent Labs: 12/03/2014: ALT 19; BUN 12; Creatinine, Ser 0.77; Platelets 292; Potassium 4.6; Sodium 140 04/09/2015: TSH 2.810  No results found for requested labs within last 8760 hours.   CrCl cannot be calculated (Patient's most recent lab result is older than the maximum 21 days allowed.).   Wt Readings from Last 3 Encounters:  11/20/15 258 lb (117 kg)  11/13/15 261 lb 2 oz (118.4 kg)  10/24/15 255  lb (115.7 kg)     Other studies reviewed: Additional studies/records reviewed today include: summarized above  ASSESSMENT AND PLAN:  1. ***  Disposition: F/u with *** in   Current medicines are reviewed at length with the patient today.  The patient did not have any concerns regarding medicines.  Melvern Banker PA-C 11/26/2015 5:04 PM     [address] [place], [place] [address] [phone]

## 2015-11-27 ENCOUNTER — Other Ambulatory Visit: Payer: Self-pay

## 2015-11-27 ENCOUNTER — Ambulatory Visit: Payer: Medicare Other | Admitting: Physician Assistant

## 2015-11-27 DIAGNOSIS — G4700 Insomnia, unspecified: Secondary | ICD-10-CM

## 2015-11-27 NOTE — Telephone Encounter (Signed)
Patient requesting refill of Zolpidem 5 mg be sent to [facility].

## 2015-11-28 NOTE — Progress Notes (Signed)
Cardiology Office Note Date:  11/29/2015  Patient ID:  Jacqueline Campbell, [place] [birth year], MRN [id number] PCP:  Loistine Chance, MD  Cardiologist:  Dr. Rockey Situ, MD    Chief Complaint: Follow up chronic diastolic CHF  History of Present Illness: Jacqueline Campbell is a 55 y.o. female with history of chronic diastolic CHF, LE swelling on lymphedema compression pump, morbid obeisty, OSA on CPAP, GAD, chronic cystitis, multiple medication intolerances, asthma, cervical cancer, and metabolic syndrome with poorly controlled BP who presents for follow up of her diastolic HF.   She was last seen by Dr. Rockey Situ on 06/14/15 with a weight of 281 pounds, which was not a significant change from her prior office visit. She felt like her dry weight should be around 220 pounds. There was no significant improvement in LE swelling dispite diuresis. Her Lasix was changed to torsemide. It was felt many of her issues were 2/2 morbid obesity. She did not want to titrate her antihypertensives at that time. Most recent echo from 12/2014 showed an EF of 60-65%, no RWMA, GR2DD, left atrium was mildly dilated, RV was mildly dilated.   She is doing well today. She has lost 11 pounds from her last office visit and states she started a walking program several weeks ago with her daughter. She is trying to eat a healthy diet. Elevates her legs at home and wears compression pumps for her lymphedema. Systolic BP in the mornings upon waking up is frequently in the low 200's. Upon taking her medications her readings will improve to the Q000111Q systolic. She notes increasing HA with his hypertension. She would like to have another sleep study done to see if her CPAP needs titration. No chest pain or SOB.    Past Medical History:  Diagnosis Date  . Asthma   . Bladder incontinence   . Cancer ([facility]) 1992   cervical  . Chronic diastolic CHF (congestive heart failure) ([place])    a. echo 12/2014: EF 60-65%, no RWMA, GR2DD, RV mildly dilated  .  Chronic insomnia   . Chronic interstitial cystitis   . GERD (gastroesophageal reflux disease)   . Glaucoma   . Hypertension   . Metabolic syndrome   . Mood disorder ([facility])   . Nausea    Chronic  . Obesity   . Ovarian cancer ([facility]) 1994  . Plantar fasciitis   . Throat cancer ([facility]) 1998    Past Surgical History:  Procedure Laterality Date  . ABDOMINAL HYSTERECTOMY  1992  . BLADDER SURGERY     multiple  . BREAST BIOPSY Right   . EYE SURGERY Bilateral 2006, 2008, 2010  . FOOT SURGERY Bilateral 1996  . KNEE SURGERY Left    age 45  . TONSILLECTOMY AND ADENOIDECTOMY  1994  . TUBAL LIGATION  1989    Current Outpatient Prescriptions  Medication Sig Dispense Refill  . acyclovir ointment (ZOVIRAX) 5 % Apply 1 application topically 5 (five) times daily as needed. 30 g 5  . albuterol (PROVENTIL) (2.5 MG/3ML) 0.083% nebulizer solution Take 2.5 mg by nebulization every 6 (six) hours as needed for wheezing or shortness of breath.    . cloNIDine (CATAPRES - DOSED IN MG/24 HR) 0.1 mg/24hr patch Place 1 patch (0.1 mg total) onto the skin once a week. 4 patch 5  . diazepam (VALIUM) 5 MG tablet Take 1 tablet (5 mg total) by mouth every 12 (twelve) hours as needed for anxiety. 60 tablet 0  . dorzolamide (TRUSOPT) 2 % ophthalmic  solution Place 1 drop into both eyes 3 (three) times daily.    Marland Kitchen EPINEPHRINE IJ Inject as directed as needed. Epi pen    . esomeprazole (NEXIUM) 40 MG capsule Take 1 capsule (40 mg total) by mouth daily before breakfast. 30 capsule 5  . Fluticasone-Salmeterol (ADVAIR) 500-50 MCG/DOSE AEPB Inhale 1 puff into the lungs every 12 (twelve) hours.    Marland Kitchen ipratropium-albuterol (DUONEB) 0.5-2.5 (3) MG/3ML SOLN Take 3 mLs by nebulization 2 (two) times daily.    Marland Kitchen linaclotide (LINZESS) 145 MCG CAPS capsule Take 1 capsule (145 mcg total) by mouth daily. 30 capsule 5  . loratadine (CLARITIN) 10 MG tablet TAKE ONE (1) TABLET EACH DAY 30 tablet 5  . meloxicam (MOBIC) 7.5 MG tablet Take 1  tablet (7.5 mg total) by mouth daily. 30 tablet 5  . mometasone (NASONEX) 50 MCG/ACT nasal spray Place 2 sprays into the nose daily. 30 g 5  . montelukast (SINGULAIR) 10 MG tablet Take 1 tablet (10 mg total) by mouth at bedtime. 30 tablet 5  . nadolol (CORGARD) 40 MG tablet Take 1 tablet (40 mg total) by mouth at bedtime. 30 tablet 5  . oxyCODONE (ROXICODONE) 15 MG immediate release tablet Limit 1 tab by mouth 3 - 4  times per day if tolerated 120 tablet 0  . pentosan polysulfate (ELMIRON) 100 MG capsule Take 1 capsule (100 mg total) by mouth 3 (three) times daily before meals. 90 capsule 12  . potassium chloride SA (K-DUR,KLOR-CON) 20 MEQ tablet TAKE ONE (1) TABLET EACH DAY 30 tablet 5  . promethazine (PHENERGAN) 25 MG tablet Take 1 tablet (25 mg total) by mouth every 6 (six) hours as needed for nausea. 30 tablet 2  . tiZANidine (ZANAFLEX) 4 MG tablet Limit 1 tablet by mouth 2 -4  times per day if tolerated 100 tablet 0  . torsemide (DEMADEX) 20 MG tablet Take 3 tablets (60 mg total) by mouth daily. 180 tablet 6  . traZODone (DESYREL) 50 MG tablet Take 0.5-1.5 tablets (25-75 mg total) by mouth at bedtime as needed for sleep. 45 tablet 5  . valACYclovir (VALTREX) 500 MG tablet Take 1 tablet (500 mg total) by mouth 1 day or 1 dose. 30 tablet 12  . valsartan-hydrochlorothiazide (DIOVAN-HCT) 320-25 MG tablet Take 1 tablet by mouth daily. 30 tablet 2  . zolpidem (AMBIEN) 5 MG tablet Take 1 tablet (5 mg total) by mouth at bedtime as needed for sleep. 30 tablet 2   Current Facility-Administered Medications  Medication Dose Route Frequency Provider Last Rate Last Dose  . orphenadrine (NORFLEX) injection 60 mg  60 mg Intramuscular Once Mohammed Kindle, MD        Allergies:   Red dye; Abilify [aripiprazole]; Amitiza [lubiprostone]; Benadryl [diphenhydramine hcl]; Doxycycline; Gabapentin; Iohexol; Lac bovis; Risperidone and related; Shellfish allergy; Strawberry (diagnostic); Strawberry extract; Aspirin;  Ciprofloxacin; Enablex [darifenacin]; Iodine; Latex; Levofloxacin; Penicillins; Risperidone; and Sulfa antibiotics   Social History:  The patient  reports that she has never smoked. She has never used smokeless tobacco. She reports that she does not drink alcohol or use drugs.   Family History:  The patient's family history includes Breast cancer in her mother; COPD in her mother; Cancer in her father; Diabetes in her mother; Heart disease in her father and mother; Hypertension in her mother; Leukemia in her son; Prostate cancer in her brother; Stroke in her mother.  ROS:   Review of Systems  Constitutional: Positive for malaise/fatigue. Negative for chills, diaphoresis, fever and weight  loss.  HENT: Negative for congestion.   Eyes: Negative for discharge and redness.  Respiratory: Negative for cough, sputum production, shortness of breath and wheezing.   Cardiovascular: Positive for leg swelling. Negative for chest pain, palpitations, orthopnea, claudication and PND.  Gastrointestinal: Negative for abdominal pain, heartburn, nausea and vomiting.  Musculoskeletal: Negative for falls and myalgias.  Skin: Negative for rash.  Neurological: Positive for headaches. Negative for dizziness, tingling, tremors, sensory change, speech change, focal weakness, loss of consciousness and weakness.  Endo/Heme/Allergies: Does not bruise/bleed easily.  Psychiatric/Behavioral: Negative for substance abuse. The patient is not nervous/anxious.   All other systems reviewed and are negative.    PHYSICAL EXAM:  VS:  BP 110/78 (BP Location: Left Arm, Patient Position: Sitting, Cuff Size: Large)   Pulse 82   Ht 5' 7.5" (1.715 m)   Wt 270 lb 8 oz (122.7 kg)   BMI 41.74 kg/m  BMI: Body mass index is 41.74 kg/m.  Physical Exam  Constitutional: She is oriented to person, place, and time. She appears well-developed and well-nourished.  Smells strongly of tobacco, denies using  HENT:  Head: Normocephalic and  atraumatic.  Eyes: Right eye exhibits no discharge. Left eye exhibits no discharge.  Neck: Normal range of motion. No JVD present.  Cardiovascular: Normal rate, regular rhythm, S1 normal, S2 normal and normal heart sounds.  Exam reveals no distant heart sounds, no friction rub, no midsystolic click and no opening snap.   No murmur heard. Pulmonary/Chest: Effort normal and breath sounds normal. No respiratory distress. She has no decreased breath sounds. She has no wheezes. She has no rales. She exhibits no tenderness.  Abdominal: Soft. She exhibits no distension. There is no tenderness.  Musculoskeletal: She exhibits edema.  1+ pitting edema to the bilateral mid shins.   Neurological: She is alert and oriented to person, place, and time.  Skin: Skin is warm and dry. No cyanosis. Nails show no clubbing.  Psychiatric: She has a normal mood and affect. Her speech is normal and behavior is normal. Judgment and thought content normal.     EKG:  Was ordered and interpreted by me today. Shows NSR, 83 bpm, no acute st/t changes   Recent Labs: 12/03/2014: ALT 19; BUN 12; Creatinine, Ser 0.77; Platelets 292; Potassium 4.6; Sodium 140 04/09/2015: TSH 2.810  No results found for requested labs within last 8760 hours.   CrCl cannot be calculated (Patient's most recent lab result is older than the maximum 21 days allowed.).   Wt Readings from Last 3 Encounters:  11/29/15 270 lb 8 oz (122.7 kg)  11/20/15 258 lb (117 kg)  11/13/15 261 lb 2 oz (118.4 kg)     Other studies reviewed: Additional studies/records reviewed today include: summarized above  ASSESSMENT AND PLAN:  1. Chronic diastolic CHF: She does not appear to be volume overloaded at this time. Continue current dose of torsemide. CHF education provided.   2. Labile HTN: Reports BP readings in the 123456 systolic upon waking up that improve to the Q000111Q systolic shortly after taking her antihypertensives. Add Norvasc 5 mg daily to be taken  in the evening prior to going to bed. Unable to add prn hydralazine 2/2 red dye allergy causing anaphylaxis. Sleep study to titrate CPAP.   3. Morbid obesity/OSA: Schedule appointment with pulmonary for sleep study.   4. Medication intolerances: As above.   5. Lymphedema: Continue current treatment.   Disposition: F/u with Dr. Rockey Situ, MD in 3 months.    Current medicines  are reviewed at length with the patient today.  The patient did not have any concerns regarding medicines.  Melvern Banker PA-C 11/29/2015 3:04 PM     [address] [place] [place], [place] [address] [phone]

## 2015-11-29 ENCOUNTER — Encounter: Payer: Self-pay | Admitting: Physician Assistant

## 2015-11-29 ENCOUNTER — Ambulatory Visit (INDEPENDENT_AMBULATORY_CARE_PROVIDER_SITE_OTHER): Payer: Medicare Other | Admitting: Physician Assistant

## 2015-11-29 VITALS — BP 110/78 | HR 82 | Ht 67.5 in | Wt 270.5 lb

## 2015-11-29 DIAGNOSIS — G4733 Obstructive sleep apnea (adult) (pediatric): Secondary | ICD-10-CM

## 2015-11-29 DIAGNOSIS — I5032 Chronic diastolic (congestive) heart failure: Secondary | ICD-10-CM

## 2015-11-29 DIAGNOSIS — Z9989 Dependence on other enabling machines and devices: Secondary | ICD-10-CM

## 2015-11-29 DIAGNOSIS — Z6841 Body Mass Index (BMI) 40.0 and over, adult: Secondary | ICD-10-CM

## 2015-11-29 DIAGNOSIS — G4730 Sleep apnea, unspecified: Secondary | ICD-10-CM | POA: Diagnosis not present

## 2015-11-29 DIAGNOSIS — I890 Lymphedema, not elsewhere classified: Secondary | ICD-10-CM | POA: Diagnosis not present

## 2015-11-29 DIAGNOSIS — I10 Essential (primary) hypertension: Secondary | ICD-10-CM | POA: Diagnosis not present

## 2015-11-29 DIAGNOSIS — Z789 Other specified health status: Secondary | ICD-10-CM

## 2015-11-29 DIAGNOSIS — Z889 Allergy status to unspecified drugs, medicaments and biological substances status: Secondary | ICD-10-CM

## 2015-11-29 DIAGNOSIS — R0989 Other specified symptoms and signs involving the circulatory and respiratory systems: Secondary | ICD-10-CM

## 2015-11-29 MED ORDER — AMLODIPINE BESYLATE 5 MG PO TABS: ORAL_TABLET | ORAL | Status: DC

## 2015-11-29 MED ORDER — AMLODIPINE BESYLATE 5 MG PO TABS: ORAL_TABLET | ORAL | 3 refills | Status: DC

## 2015-11-29 NOTE — Addendum Note (Signed)
Addended by: Alvis Lemmings C on: 11/29/2015 03:34 PM   Modules accepted: Orders

## 2015-11-29 NOTE — Patient Instructions (Addendum)
Medication Instructions: - Your physician has recommended you make the following change in your medication:   1) Start norvasc (amlodipine) 5 mg one tablet by mouth every night  Labwork: - none ordered  Procedures/Testing: - none ordered  Follow-Up: - You have been referred to : Pulmonary for evaluation for a sleep study  - Your physician recommends that you schedule a follow-up appointment in: 3 months with Dr. Rockey Situ  Any Additional Special Instructions Will Be Listed Below (If Applicable).     If you need a refill on your cardiac medications before your next appointment, please call your pharmacy.

## 2015-12-11 ENCOUNTER — Other Ambulatory Visit: Payer: Self-pay

## 2015-12-23 ENCOUNTER — Other Ambulatory Visit: Payer: Self-pay | Admitting: Pain Medicine

## 2016-01-06 DIAGNOSIS — F325 Major depressive disorder, single episode, in full remission: Secondary | ICD-10-CM | POA: Insufficient documentation

## 2016-01-06 DIAGNOSIS — Z0000 Encounter for general adult medical examination without abnormal findings: Secondary | ICD-10-CM | POA: Insufficient documentation

## 2016-01-06 DIAGNOSIS — J45909 Unspecified asthma, uncomplicated: Secondary | ICD-10-CM | POA: Insufficient documentation

## 2016-01-14 ENCOUNTER — Ambulatory Visit: Payer: Medicare Other | Attending: Specialist

## 2016-01-15 ENCOUNTER — Ambulatory Visit: Payer: Medicare Other

## 2016-01-24 ENCOUNTER — Ambulatory Visit: Payer: Medicare Other | Attending: Specialist

## 2016-01-24 DIAGNOSIS — G4733 Obstructive sleep apnea (adult) (pediatric): Secondary | ICD-10-CM | POA: Insufficient documentation

## 2016-02-14 ENCOUNTER — Ambulatory Visit: Payer: Self-pay | Admitting: Family Medicine

## 2016-02-14 ENCOUNTER — Encounter: Payer: Self-pay | Admitting: Family Medicine

## 2016-02-14 LAB — COLOGUARD: Cologuard: NEGATIVE

## 2016-02-26 ENCOUNTER — Ambulatory Visit: Payer: Medicare Other | Attending: Specialist

## 2016-02-26 DIAGNOSIS — G4733 Obstructive sleep apnea (adult) (pediatric): Secondary | ICD-10-CM | POA: Insufficient documentation

## 2016-02-28 ENCOUNTER — Ambulatory Visit: Payer: Medicare Other | Admitting: Cardiovascular Disease

## 2016-04-16 ENCOUNTER — Other Ambulatory Visit: Payer: Self-pay

## 2016-04-16 MED ORDER — AMLODIPINE BESYLATE 5 MG PO TABS: ORAL_TABLET | ORAL | 3 refills | Status: DC

## 2016-04-16 MED ORDER — TORSEMIDE 20 MG PO TABS: 60.0000 mg | ORAL_TABLET | Freq: Every day | ORAL | 3 refills | Status: DC

## 2016-04-16 NOTE — Telephone Encounter (Signed)
Requested Prescriptions   Signed Prescriptions Disp Refills  . amLODipine (NORVASC) 5 MG tablet 30 tablet 3    Sig: Take one tablet (5 mg) by mouth every night    Authorizing Provider: Minna Merritts    Ordering User: Eugenio Hoes, MARINA C  . torsemide (DEMADEX) 20 MG tablet 90 tablet 3    Sig: Take 3 tablets (60 mg total) by mouth daily.    Authorizing Provider: Minna Merritts    Ordering User: Britt Bottom

## 2016-04-16 NOTE — Telephone Encounter (Signed)
Pt states she has a medication for water retention she needs refilled, but not sure the name. It is for water retention.

## 2016-04-22 ENCOUNTER — Encounter: Payer: Self-pay | Admitting: Cardiovascular Disease

## 2016-04-22 ENCOUNTER — Ambulatory Visit (INDEPENDENT_AMBULATORY_CARE_PROVIDER_SITE_OTHER): Payer: Medicare Other | Admitting: Cardiovascular Disease

## 2016-04-22 VITALS — BP 127/81 | HR 84 | Ht 67.5 in | Wt 263.8 lb

## 2016-04-22 DIAGNOSIS — G4733 Obstructive sleep apnea (adult) (pediatric): Secondary | ICD-10-CM | POA: Diagnosis not present

## 2016-04-22 DIAGNOSIS — I10 Essential (primary) hypertension: Secondary | ICD-10-CM

## 2016-04-22 DIAGNOSIS — I5030 Unspecified diastolic (congestive) heart failure: Secondary | ICD-10-CM

## 2016-04-22 NOTE — Patient Instructions (Addendum)
Medication Instructions:   No medication changes made  Please take extra torsemide for total of 3 a day for fluid  Labwork:  No new labs needed  Testing/Procedures:  No further testing at this time   I recommend watching educational videos on topics of interest to you at:       www.goemmi.com  Enter code: [email or web address]    Follow-Up: It was a pleasure seeing you in the office today. Please call us if you have new issues that need to be addressed before your next appt.  [phone]  Your physician wants you to follow-up in: 12 months.  You will receive a reminder letter in the mail two months in advance. If you don't receive a letter, please call our office to schedule the follow-up appointment.  If you need a refill on your cardiac medications before your next appointment, please call your pharmacy.    [facility] Hollice Espy [phone]

## 2016-04-22 NOTE — Progress Notes (Signed)
Cardiology Office Note  Date:  04/22/2016   ID:  NANEA CRAIGE, DOB 1961-03-16, MRN [id number]  PCP:  Loistine Chance, MD   Chief Complaint  Patient presents with  . other    3 month follow up. Meds reviewed by the pt. verbally. "doing well."     HPI:  Ms. Ruvalcaba is a 56 year old woman with morbid obesity, general anxiety disorder, chronic cystitis. Obstructive sleep apnea on Bipap, worsening symptoms of lower extremity edema starting August 2016,  legs wrapped for blistering, with improvement of her symptoms, presents today for diastolic CHF, leg edema prerenal azotemia, dehydration September 2015  In follow-up she reports that her leg swelling is relatively stable She is followed by Dr. Raul Del, now on Bipap, rather than her CPAP Uses several inhalers such as albuterol, Symbicort  Reports having Neuropathy, bottom part of her feet bilaterally  Weight continues to be a problem, poor diet Drinks diet coke 5 or more a day. Does not drink any water Does not smoke, surrounded by secondhand smoke. Smells like smoke in the exam room today No regular exercise program She alternates Lasix 20 mgrams twice a day with 20 mg/40 mg every other day  Lab work reviewed Total chol 193, LDL 112  EKG on today's visit shows normal sinus rhythm with rate 84 bpm no significant ST or T-wave changes  On her last clinic visit, recommended she take extra doses of Lasix after lunch for leg edema. She is taking 20 mg twice a day 1 day alternating with 20 mgrams in the morning/40 mg in the  She has not noticed any significant improvement in her leg edema, in fact symptoms may be worse She reports blood pressure has been running high at home, sometimes in the 180 range she is using lymphedema compression pump  she does not want to go up on her clonidine patch, this caused hypotension in the past   she does have some shortness of breath on exertion   other past medical history patient reportsapid  weight gain starting in August September  2016, " up 45 pounds" Denies changes to her diet in that time Feels that her dry weight should be 220 pounds, down 60 pounds  Echocardiogram reviewed with her showing normal LV function, evidence of diastolic dysfunction  normal right heart pressures in October 2016   PMH:   has a past medical history of Asthma; Bladder incontinence; Cancer ([place]) (1992); Chronic diastolic CHF (congestive heart failure) ([place]); Chronic insomnia; Chronic interstitial cystitis; GERD (gastroesophageal reflux disease); Glaucoma; Hypertension; Metabolic syndrome; Mood disorder ([place]); Nausea; Obesity; Ovarian cancer ([place]) (1994); Plantar fasciitis; and Throat cancer ([place]) (1998).  PSH:    Past Surgical History:  Procedure Laterality Date  . ABDOMINAL HYSTERECTOMY  1992  . BLADDER SURGERY     multiple  . BREAST BIOPSY Right   . EYE SURGERY Bilateral 2006, 2008, 2010  . FOOT SURGERY Bilateral 1996  . KNEE SURGERY Left    age 25  . TONSILLECTOMY AND ADENOIDECTOMY  1994  . TUBAL LIGATION  1989    Current Outpatient Prescriptions  Medication Sig Dispense Refill  . acyclovir ointment (ZOVIRAX) 5 % Apply 1 application topically 5 (five) times daily as needed. 30 g 5  . albuterol (PROVENTIL HFA;VENTOLIN HFA) 108 (90 Base) MCG/ACT inhaler Inhale 2 puffs into the lungs every 4 (four) hours as needed.     Marland Kitchen albuterol (PROVENTIL) (2.5 MG/3ML) 0.083% nebulizer solution Take 2.5 mg by nebulization every 6 (six) hours as  needed for wheezing or shortness of breath.    Marland Kitchen amLODipine (NORVASC) 5 MG tablet Take one tablet (5 mg) by mouth every night 30 tablet 3  . budesonide-formoterol (SYMBICORT) 80-4.5 MCG/ACT inhaler Inhale 2 puffs into the lungs 2 (two) times daily.    . cloNIDine (CATAPRES - DOSED IN MG/24 HR) 0.1 mg/24hr patch Place 1 patch (0.1 mg total) onto the skin once a week. 4 patch 5  . diazepam (VALIUM) 5 MG tablet Take 1 tablet (5 mg total) by mouth every 12 (twelve)  hours as needed for anxiety. 60 tablet 0  . dorzolamide (TRUSOPT) 2 % ophthalmic solution Place 1 drop into both eyes 3 (three) times daily.    Marland Kitchen EPINEPHRINE IJ Inject as directed as needed. Epi pen    . esomeprazole (NEXIUM) 40 MG capsule Take 1 capsule (40 mg total) by mouth daily before breakfast. 30 capsule 5  . Fluticasone-Salmeterol (ADVAIR) 500-50 MCG/DOSE AEPB Inhale 1 puff into the lungs every 12 (twelve) hours.    Marland Kitchen ipratropium-albuterol (DUONEB) 0.5-2.5 (3) MG/3ML SOLN Take 3 mLs by nebulization 2 (two) times daily.    Marland Kitchen linaclotide (LINZESS) 145 MCG CAPS capsule Take 1 capsule (145 mcg total) by mouth daily. 30 capsule 5  . loratadine (CLARITIN) 10 MG tablet TAKE ONE (1) TABLET EACH DAY 30 tablet 5  . meloxicam (MOBIC) 7.5 MG tablet Take 1 tablet (7.5 mg total) by mouth daily. 30 tablet 5  . mometasone (NASONEX) 50 MCG/ACT nasal spray Place 2 sprays into the nose daily. 30 g 5  . montelukast (SINGULAIR) 10 MG tablet Take 1 tablet (10 mg total) by mouth at bedtime. 30 tablet 5  . nadolol (CORGARD) 40 MG tablet Take 1 tablet (40 mg total) by mouth at bedtime. 30 tablet 5  . oxyCODONE (ROXICODONE) 15 MG immediate release tablet Limit 1 tab by mouth 3 - 4  times per day if tolerated 120 tablet 0  . pentosan polysulfate (ELMIRON) 100 MG capsule Take 1 capsule (100 mg total) by mouth 3 (three) times daily before meals. 90 capsule 12  . potassium chloride SA (K-DUR,KLOR-CON) 20 MEQ tablet TAKE ONE (1) TABLET EACH DAY 30 tablet 5  . promethazine (PHENERGAN) 25 MG tablet Take 1 tablet (25 mg total) by mouth every 6 (six) hours as needed for nausea. 30 tablet 2  . tiZANidine (ZANAFLEX) 4 MG tablet Limit 1 tablet by mouth 2 -4  times per day if tolerated 100 tablet 0  . torsemide (DEMADEX) 20 MG tablet Take 3 tablets (60 mg total) by mouth daily. 90 tablet 3  . valACYclovir (VALTREX) 500 MG tablet Take 1 tablet (500 mg total) by mouth 1 day or 1 dose. 30 tablet 12  . valsartan-hydrochlorothiazide  (DIOVAN-HCT) 320-25 MG tablet Take 1 tablet by mouth daily. 30 tablet 2  . zolpidem (AMBIEN) 5 MG tablet Take 1 tablet (5 mg total) by mouth at bedtime as needed for sleep. 30 tablet 2  . escitalopram (LEXAPRO) 10 MG tablet Take by mouth.     Current Facility-Administered Medications  Medication Dose Route Frequency Provider Last Rate Last Dose  . orphenadrine (NORFLEX) injection 60 mg  60 mg Intramuscular Once Mohammed Kindle, MD         Allergies:   Red dye; Abilify [aripiprazole]; Amitiza [lubiprostone]; Benadryl [diphenhydramine hcl]; Doxycycline; Gabapentin; Iohexol; Lac bovis; Risperidone and related; Shellfish allergy; Strawberry (diagnostic); Strawberry extract; Aspirin; Ciprofloxacin; Enablex [darifenacin]; Iodine; Latex; Levofloxacin; Penicillins; Risperidone; and Sulfa antibiotics   Social History:  The patient  reports that she has never smoked. She has never used smokeless tobacco. She reports that she does not drink alcohol or use drugs.   Family History:   family history includes Breast cancer in her mother; COPD in her mother; Cancer in her father; Diabetes in her mother; Heart disease in her father and mother; Hypertension in her mother; Leukemia in her son; Prostate cancer in her brother; Stroke in her mother.    Review of Systems: Review of Systems  Constitutional: Negative.   Respiratory: Positive for shortness of breath.   Cardiovascular: Positive for leg swelling.  Gastrointestinal: Negative.   Musculoskeletal: Negative.   Neurological: Negative.   Psychiatric/Behavioral: Negative.   All other systems reviewed and are negative.    PHYSICAL EXAM: VS:  BP 127/81 (BP Location: Left Arm, Patient Position: Sitting, Cuff Size: Large)   Pulse 84   Ht 5' 7.5" (1.715 m)   Wt 263 lb 12 oz (119.6 kg)   BMI 40.70 kg/m  , BMI Body mass index is 40.7 kg/m. GEN: Well nourished, well developed, in no acute distress, obese  HEENT: normal  Neck: no JVD, carotid bruits, or  masses Cardiac: RRR; no murmurs, rubs, or gallops, trace lower extremity edema bilaterally Respiratory:  clear to auscultation bilaterally, normal work of breathing GI: soft, nontender, nondistended, + BS MS: no deformity or atrophy  Skin: warm and dry, no rash Neuro:  Strength and sensation are intact Psych: euthymic mood, full affect    Recent Labs: No results found for requested labs within last 8760 hours.    Lipid Panel Lab Results  Component Value Date   CHOL 181 07/03/2014   HDL 52 07/03/2014   LDLCALC 99 07/03/2014   TRIG 152 07/03/2014      Wt Readings from Last 3 Encounters:  04/22/16 263 lb 12 oz (119.6 kg)  11/29/15 270 lb 8 oz (122.7 kg)  11/20/15 258 lb (117 kg)       ASSESSMENT AND PLAN:  Diastolic heart failure, unspecified heart failure chronicity ([place]) - Plan: EKG 12-Lead Long discussion with her concerning heart failure management  Discussed diuretic regimen in detail, suspect she has mild fluid overload given pitting edema. Suggested she take torsemide 3 pills per day on a more frequent basis. Also needs to drastically cut back on her soda intake. Currently drinks 5 or more day  Morbid obesity ([place]) Recommended low carbohydrate diet, exercise program  Hypertension, benign Blood pressure is well controlled on today's visit. No changes made to the medications.  OSA treated with BiPAP Recommended compliance with her Bipap, weight loss   Total encounter time more than 25 minutes  Greater than 50% was spent in counseling and coordination of care with the patient   Disposition:   F/U  12 months   Orders Placed This Encounter  Procedures  . EKG 12-Lead     Signed, Esmond Plants, M.D., Ph.D. 04/22/2016  [place], [place]

## 2016-07-16 ENCOUNTER — Other Ambulatory Visit: Payer: Self-pay | Admitting: Internal Medicine

## 2016-07-16 DIAGNOSIS — N6312 Unspecified lump in the right breast, upper inner quadrant: Secondary | ICD-10-CM

## 2016-07-16 DIAGNOSIS — N6310 Unspecified lump in the right breast, unspecified quadrant: Secondary | ICD-10-CM

## 2016-07-23 ENCOUNTER — Other Ambulatory Visit: Payer: Self-pay | Admitting: Internal Medicine

## 2016-07-23 DIAGNOSIS — N6310 Unspecified lump in the right breast, unspecified quadrant: Secondary | ICD-10-CM

## 2016-07-23 DIAGNOSIS — N6312 Unspecified lump in the right breast, upper inner quadrant: Secondary | ICD-10-CM

## 2016-07-31 ENCOUNTER — Other Ambulatory Visit: Payer: Medicare Other

## 2016-07-31 ENCOUNTER — Ambulatory Visit: Payer: Medicare Other

## 2016-09-14 ENCOUNTER — Other Ambulatory Visit: Payer: Self-pay | Admitting: Cardiovascular Disease

## 2016-10-05 ENCOUNTER — Emergency Department: Payer: Medicare Other

## 2016-10-05 ENCOUNTER — Emergency Department
Admission: EM | Admit: 2016-10-05 | Discharge: 2016-10-05 | Disposition: A | Discharge status: Home / Self Care | Payer: Medicare Other | Attending: Emergency Medicine | Admitting: Emergency Medicine

## 2016-10-05 DIAGNOSIS — J45909 Unspecified asthma, uncomplicated: Secondary | ICD-10-CM | POA: Insufficient documentation

## 2016-10-05 DIAGNOSIS — G8929 Other chronic pain: Secondary | ICD-10-CM | POA: Diagnosis not present

## 2016-10-05 DIAGNOSIS — E860 Dehydration: Secondary | ICD-10-CM | POA: Diagnosis not present

## 2016-10-05 DIAGNOSIS — Z791 Long term (current) use of non-steroidal anti-inflammatories (NSAID): Secondary | ICD-10-CM | POA: Insufficient documentation

## 2016-10-05 DIAGNOSIS — I5032 Chronic diastolic (congestive) heart failure: Secondary | ICD-10-CM | POA: Insufficient documentation

## 2016-10-05 DIAGNOSIS — Z79899 Other long term (current) drug therapy: Secondary | ICD-10-CM | POA: Diagnosis not present

## 2016-10-05 DIAGNOSIS — R55 Syncope and collapse: Secondary | ICD-10-CM | POA: Diagnosis present

## 2016-10-05 DIAGNOSIS — I10 Essential (primary) hypertension: Secondary | ICD-10-CM | POA: Diagnosis not present

## 2016-10-05 DIAGNOSIS — Z91040 Latex allergy status: Secondary | ICD-10-CM | POA: Insufficient documentation

## 2016-10-05 DIAGNOSIS — I110 Hypertensive heart disease with heart failure: Secondary | ICD-10-CM | POA: Diagnosis not present

## 2016-10-05 LAB — URINALYSIS, COMPLETE (UACMP) WITH MICROSCOPIC
Bacteria, UA: NONE SEEN
Bilirubin Urine: NEGATIVE
GLUCOSE, UA: NEGATIVE mg/dL
HGB URINE DIPSTICK: NEGATIVE
KETONES UR: 5 mg/dL — AB
Nitrite: NEGATIVE
PH: 7 (ref 5.0–8.0)
Protein, ur: 30 mg/dL — AB
Specific Gravity, Urine: 1.01 (ref 1.005–1.030)

## 2016-10-05 LAB — URINE DRUG SCREEN, QUALITATIVE (ARMC ONLY)
AMPHETAMINES, UR SCREEN: NOT DETECTED
BENZODIAZEPINE, UR SCRN: POSITIVE — AB
Barbiturates, Ur Screen: NOT DETECTED
Cannabinoid 50 Ng, Ur ~~LOC~~: NOT DETECTED
Cocaine Metabolite,Ur ~~LOC~~: NOT DETECTED
MDMA (ECSTASY) UR SCREEN: NOT DETECTED
Methadone Scn, Ur: NOT DETECTED
OPIATE, UR SCREEN: NOT DETECTED
PHENCYCLIDINE (PCP) UR S: NOT DETECTED
Tricyclic, Ur Screen: NOT DETECTED

## 2016-10-05 LAB — BASIC METABOLIC PANEL
ANION GAP: 14 (ref 5–15)
BUN: 11 mg/dL (ref 6–20)
CO2: 21 mmol/L — ABNORMAL LOW (ref 22–32)
Calcium: 9.3 mg/dL (ref 8.9–10.3)
Chloride: 103 mmol/L (ref 101–111)
Creatinine, Ser: 0.81 mg/dL (ref 0.44–1.00)
Glucose, Bld: 133 mg/dL — ABNORMAL HIGH (ref 65–99)
POTASSIUM: 3.1 mmol/L — AB (ref 3.5–5.1)
SODIUM: 138 mmol/L (ref 135–145)

## 2016-10-05 LAB — CBC WITH DIFFERENTIAL/PLATELET
BASOS ABS: 0 10*3/uL (ref 0–0.1)
BASOS PCT: 0 %
EOS ABS: 0.1 10*3/uL (ref 0–0.7)
EOS PCT: 1 %
HCT: 41.1 % (ref 35.0–47.0)
HEMOGLOBIN: 13.9 g/dL (ref 12.0–16.0)
Lymphocytes Relative: 51 %
Lymphs Abs: 5.3 10*3/uL — ABNORMAL HIGH (ref 1.0–3.6)
MCH: 30.1 pg (ref 26.0–34.0)
MCHC: 33.8 g/dL (ref 32.0–36.0)
MCV: 89 fL (ref 80.0–100.0)
Monocytes Absolute: 0.7 10*3/uL (ref 0.2–0.9)
Monocytes Relative: 7 %
NEUTROS PCT: 41 %
Neutro Abs: 4.2 10*3/uL (ref 1.4–6.5)
PLATELETS: 330 10*3/uL (ref 150–440)
RBC: 4.62 MIL/uL (ref 3.80–5.20)
RDW: 13.8 % (ref 11.5–14.5)
WBC: 10.3 10*3/uL (ref 3.6–11.0)

## 2016-10-05 LAB — BRAIN NATRIURETIC PEPTIDE: B Natriuretic Peptide: 23 pg/mL (ref 0.0–100.0)

## 2016-10-05 LAB — TROPONIN I

## 2016-10-05 LAB — GLUCOSE, CAPILLARY: Glucose-Capillary: 135 mg/dL — ABNORMAL HIGH (ref 65–99)

## 2016-10-05 MED ORDER — ACETAMINOPHEN 325 MG PO TABS
650.0000 mg | ORAL_TABLET | Freq: Once | ORAL | Status: AC
Start: 2016-10-05 — End: 2016-10-05
  Administered 2016-10-05: 650 mg via ORAL
  Filled 2016-10-05: qty 2

## 2016-10-05 MED ORDER — SODIUM CHLORIDE 0.9 % IV BOLUS (SEPSIS)
1000.0000 mL | Freq: Once | INTRAVENOUS | Status: AC
  Administered 2016-10-05: 1000 mL via INTRAVENOUS

## 2016-10-05 MED ORDER — DIPHENHYDRAMINE HCL 50 MG/ML IJ SOLN
12.5000 mg | Freq: Once | INTRAMUSCULAR | Status: AC
  Administered 2016-10-05: 12.5 mg via INTRAVENOUS
  Filled 2016-10-05: qty 1

## 2016-10-05 NOTE — ED Notes (Signed)
EDP confirmed with pt that she is allergic to dye in PO benadryl and not to benadryl itself.  VO received from EDP for IV benadryl 12.5mg .

## 2016-10-05 NOTE — ED Notes (Signed)
Will notify EDP that it is unsafe at this time to do orthostatic vitals on patient.
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
## 2016-10-05 NOTE — ED Notes (Signed)
Pt discharged to home.  Ffriend driving.  Discharge instructions reviewed.  Verbalized understanding.  No questions or concerns at this time.  Teach back verified.  Pt in NAD.  No items left in ED.

## 2016-10-05 NOTE — ED Notes (Signed)
Pt up to bathroom, steady gait with standby assist.  Pt reports feeling a little dizzy when she gets up.  Pt is more alert at this time.

## 2016-10-05 NOTE — ED Triage Notes (Signed)
Pt was in medical parking lot and had syncopal episode, found by staff member.  Pt A&Ox4 upon arrival to room.  Pt states that she has not eaten in 2 days and has been under a lot of stress going back and forth to hospital visiting family and friends.  Pt gray, pale and diaphoretic upon arrival to ER.  Pt shivering at this time.  Blanket applied.  Pt's O2 87% on RA.  Pt placed on 3LNC.  Pt c/o itching to bilateral legs from ant bites.  Ants noted to be on patients legs by this RN.  Clothing removed.

## 2017-01-12 ENCOUNTER — Other Ambulatory Visit: Payer: Self-pay

## 2017-01-12 MED ORDER — AMLODIPINE BESYLATE 5 MG PO TABS: ORAL_TABLET | ORAL | 3 refills | Status: DC

## 2017-04-22 ENCOUNTER — Ambulatory Visit: Payer: Medicare Other | Admitting: Cardiovascular Disease

## 2017-06-23 ENCOUNTER — Telehealth: Payer: Self-pay | Admitting: Cardiovascular Disease

## 2017-06-23 NOTE — Telephone Encounter (Signed)
Spoke with patient and she states that she has been driving to [place] almost on a daily basis and has noticed swelling in her legs and weight gain. She states that this has been going on for a year but more noticeable over the last month. Reviewed that compression hose, elevation, and diet changes can help. During our conversation she mentioned some "blue things that suck the water out of her legs" and reports that she has been using these. Reviewed again importance of compression, low sodium diet, and elevation. Advised that she could take 1 extra pill of torsemide for 2 days and then we will see her on Tuesday. Confirmed her upcoming appointment next week and recommended that if her symptoms should worsen to go to ED for evaluation. She verbalized understanding with no further questions at this time.

## 2017-06-23 NOTE — Telephone Encounter (Signed)
Pt c/o swelling: STAT is pt has developed SOB within 24 hours  1) How much weight have you gained and in what time span? 20 lbs 1 mth  2) If swelling, where is the swelling located? Interim in s/l Thighs to toes   3) Are you currently taking a fluid pill? Torsemide   4) Are you currently SOB? Sometimes   5) Do you have a log of your daily weights (if so, list)? No   6) Have you gained 3 pounds in a day or 5 pounds in a week? Not sure   7) Have you traveled recently?  Driving back and forth to [place] to handle family estate

## 2017-06-26 NOTE — Progress Notes (Signed)
Cardiology Office Note  Date:  06/29/2017   ID:  Jacqueline Campbell, DOB December 05, 1960, MRN [id number]  PCP:  Freddrick March, MD   Chief Complaint  Patient presents with  . OTHER    Right arm numbness, Weight gain and elevated BP. Meds reviewed verbally with pt.    HPI:  Jacqueline Campbell is a 57 year old woman with  morbid obesity,  general anxiety disorder,  Chronic back pain chronic cystitis  Obstructive sleep apnea on Bipap,  worsening symptoms of lower extremity edema starting August 2016,   legs wrapped for blistering, with improvement of her symptoms,  prerenal azotemia, dehydration September 2015 Echo 12/2014: normal EF presents today for diastolic CHF, leg edema  last seen 04/2016  10/05/2016 LOC, seen in the ER had not eaten or drank anything in 2 days. 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 Raul Del,  on [place], rather than her CPAP Uses several inhalers such as albuterol, Symbicort  Reports having Neuropathy, bottom part of her feet bilaterally  Weight continues to be a problem, poor diet Does not smoke, surrounded by secondhand smoke.   EKG personally reviewed by myself on todays visit Shows normal sinus rhythm with rate 91 bpm no significant ST or T wave changes   other past medical history patient reportsapid weight gain starting in August September  2016, " up 45 pounds" Denies changes to her diet in that time Feels that her dry weight should be 220 pounds,  down 60 pounds  Echocardiogram reviewed with her showing normal LV function, evidence of diastolic dysfunction  normal right heart pressures in October 2016   PMH:   has a past medical history of Asthma, Bladder incontinence, Cancer ([place]) (1992), Chronic diastolic CHF (congestive heart failure) ([place]), Chronic insomnia, Chronic interstitial cystitis, GERD (gastroesophageal reflux disease), Glaucoma, Hypertension, Metabolic syndrome, Mood disorder ([place]), Nausea, Obesity, Ovarian cancer ([place]) (1994), Plantar fasciitis, and 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  PSH:    Past Surgical History:  Procedure Laterality Date  . ABDOMINAL HYSTERECTOMY  1992  . BLADDER SURGERY     multiple  . BREAST BIOPSY Right   . EYE SURGERY Bilateral 2006, 2008, 2010  . FOOT SURGERY Bilateral 1996  . KNEE SURGERY Left    age 70  . TONSILLECTOMY AND ADENOIDECTOMY  1994  . TUBAL LIGATION  1989    Current Outpatient Medications  Medication Sig Dispense Refill  . acyclovir ointment (ZOVIRAX) 5 % Apply 1 application topically 5 (five) times daily as needed. 30 g 5  . albuterol (PROVENTIL) (2.5 MG/3ML) 0.083% nebulizer solution Take 2.5 mg by nebulization every 6 (six) hours as needed for wheezing or shortness of breath.    Marland Kitchen amLODipine (NORVASC) 5 MG tablet TAKE 1 TABLET BY MOUTH NIGTHLY 30 tablet 3  . budesonide-formoterol (SYMBICORT) 80-4.5 MCG/ACT inhaler Inhale 2 puffs into the lungs 2 (two) times daily.    . cloNIDine (CATAPRES - DOSED IN MG/24 HR) 0.1 mg/24hr patch Place 1 patch (0.1 mg total) onto the  skin once a week. 4 patch 5  . diazepam (VALIUM) 5 MG tablet Take 1 tablet (5 mg total) by mouth every 12 (twelve) hours as needed for anxiety. 60 tablet 0  . dorzolamide (TRUSOPT) 2 % ophthalmic solution Place 1 drop into both eyes 3 (three) times daily.    Marland Kitchen EPINEPHRINE IJ Inject as directed as needed. Epi pen    . escitalopram (LEXAPRO) 10 MG tablet Take by mouth.    . esomeprazole (NEXIUM) 40 MG capsule Take 1  capsule (40 mg total) by mouth daily before breakfast. 30 capsule 5  . Fluticasone-Salmeterol (ADVAIR) 500-50 MCG/DOSE AEPB Inhale 1 puff into the lungs every 12 (twelve) hours.    Marland Kitchen ipratropium-albuterol (DUONEB) 0.5-2.5 (3) MG/3ML SOLN Take 3 mLs by nebulization 2 (two) times daily.    Marland Kitchen linaclotide (LINZESS) 145 MCG CAPS capsule Take 1 capsule (145 mcg total) by mouth daily. 30 capsule 5  . loratadine (CLARITIN) 10 MG tablet TAKE ONE (1) TABLET EACH DAY 30 tablet 5  . meloxicam (MOBIC) 7.5 MG tablet Take 1 tablet (7.5 mg total) by mouth daily. 30 tablet 5  . mometasone (NASONEX) 50 MCG/ACT nasal spray Place 2 sprays into the nose daily. 30 g 5  . montelukast (SINGULAIR) 10 MG tablet Take 1 tablet (10 mg total) by mouth at bedtime. 30 tablet 5  . nadolol (CORGARD) 40 MG tablet Take 1 tablet (40 mg total) by mouth at bedtime. 30 tablet 5  . oxyCODONE (ROXICODONE) 15 MG immediate release tablet Limit 1 tab by mouth 3 - 4  times per day if tolerated 120 tablet 0  . pentosan polysulfate (ELMIRON) 100 MG capsule Take 1 capsule (100 mg total) by mouth 3 (three) times daily before meals. 90 capsule 12  . potassium chloride SA (K-DUR,KLOR-CON) 20 MEQ tablet TAKE ONE (1) TABLET EACH DAY 30 tablet 5  . promethazine (PHENERGAN) 25 MG tablet Take 1 tablet (25 mg total) by mouth every 6 (six) hours as needed for nausea. 30 tablet 2  . tiZANidine (ZANAFLEX) 4 MG tablet Limit 1 tablet by mouth 2 -4  times per day if tolerated 100 tablet 0  . torsemide (DEMADEX) 20 MG tablet Take 3 tablets (60 mg total) by mouth daily. 90 tablet 3  . valACYclovir (VALTREX) 500 MG tablet Take 1 tablet (500 mg total) by mouth 1 day or 1 dose. 30 tablet 12  . valsartan-hydrochlorothiazide (DIOVAN-HCT) 320-25 MG tablet Take 1 tablet by mouth daily. 30 tablet 2  . zolpidem (AMBIEN) 5 MG tablet Take 1 tablet (5 mg total) by mouth at bedtime as needed for sleep. 30 tablet 2   Current Facility-Administered Medications  Medication Dose  Route Frequency Provider Last Rate Last Dose  . orphenadrine (NORFLEX) injection 60 mg  60 mg Intramuscular Once Mohammed Kindle, MD         Allergies:   Red dye; Abilify [aripiprazole]; Amitiza [lubiprostone]; Benadryl [diphenhydramine hcl]; Doxycycline; Gabapentin; Iohexol; Lac bovis; Risperidone and related; Shellfish allergy; Strawberry (diagnostic); Strawberry extract; Aspirin; Ciprofloxacin; Enablex [darifenacin]; Iodine; Latex; Levofloxacin; Penicillins; Risperidone; and Sulfa antibiotics   Social History:  The patient  reports that she has never smoked. She has never used smokeless tobacco. She reports that she does not drink alcohol or use drugs.   Family History:   family history includes Breast cancer in her mother; COPD in her mother; Cancer in her father; Diabetes in her mother; Heart disease in her father and mother; Hypertension in her mother; Leukemia in her son;  Prostate cancer in her brother; Stroke in her mother.    Review of Systems: Review of Systems  Constitutional: Negative.   Respiratory: Positive for shortness of breath.   Cardiovascular: Positive for leg swelling.  Gastrointestinal: Negative.   Musculoskeletal: Negative.   Neurological: Negative.   Psychiatric/Behavioral: The patient is nervous/anxious.   All other systems reviewed and are negative.    PHYSICAL EXAM: VS:  BP 132/88 (BP Location: Left Arm, Patient Position: Sitting, Cuff Size: Large)   Pulse 91   Ht 5' 7.5" (1.715 m)   Wt 267 lb 4 oz (121.2 kg)   BMI 41.24 kg/m  , BMI Body mass index is 41.24 kg/m. Constitutional:  oriented to person, place, and time. No distress. Obese, tearful HENT:  Head: Normocephalic and atraumatic.  Eyes:  no discharge. No scleral icterus.  Neck: Normal range of motion. Neck supple. No JVD present.  Cardiovascular: Normal rate, regular rhythm, normal heart sounds and intact distal pulses. Exam reveals no gallop and no friction rub. No edema No murmur  heard. Pulmonary/Chest: Effort normal and breath sounds normal. No stridor. No respiratory distress.  no wheezes.  no rales.  no tenderness.  Abdominal: Soft.  no distension.  no tenderness.  Musculoskeletal: Normal range of motion.  no  tenderness or deformity.  Neurological:  normal muscle tone. Coordination normal. No atrophy Skin: Skin is warm and dry. No rash noted. not diaphoretic.  Psychiatric:  normal mood and affect. behavior is normal. Thought content normal.   Recent Labs: 10/05/2016: B Natriuretic Peptide 23.0; BUN 11; Creatinine, Ser 0.81; Hemoglobin 13.9; Platelets 330; Potassium 3.1; Sodium 138    Lipid Panel Lab Results  Component Value Date   CHOL 181 07/03/2014   HDL 52 07/03/2014   LDLCALC 99 07/03/2014   TRIG 152 07/03/2014      Wt Readings from Last 3 Encounters:  06/29/17 267 lb 4 oz (121.2 kg)  10/05/16 240 lb (108.9 kg) from the emergency room, not accurate()  04/22/16 263 lb 12 oz (119.6 kg)      ASSESSMENT AND PLAN:  Diastolic heart failure, unspecified heart failure chronicity ([place]) - Plan: EKG 12-Lead Previously with high fluid intake Recommend she continue on her torsemide twice daily Weight up 4 pounds from 1 year ago  Continue with her lymphedema compression pumps  Adjustment disorder Tearful in the exam room today, reports having significant stressors at home She is requesting a letter so that she does not have to take care of additional family responsibilities, 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  Hypertension, benign Blood pressure is well controlled on today's visit. No changes made to the medications. Stable  OSA treated with BiPAP Recommended compliance with her Bipap,  High weight continues to be a problem adding to symptoms  Lower  extremity edema Exacerbated by weight Also with component of lymphedema Symptoms improved with lymphedema compression pumps 3 times a day Encouraged her to continue to wear her compression hose   Total encounter time more than 45 minutes  Greater than 50% was spent in counseling and coordination of care with the patient   Disposition:   F/U  12 months as needed   Orders Placed This Encounter  Procedures  . EKG 12-Lead     Signed, Esmond Plants, M.D., Ph.D. 06/29/2017  [place],  [place] [phone]

## 2017-06-29 ENCOUNTER — Ambulatory Visit (INDEPENDENT_AMBULATORY_CARE_PROVIDER_SITE_OTHER): Payer: Medicare Other | Admitting: Cardiovascular Disease

## 2017-06-29 ENCOUNTER — Encounter: Payer: Self-pay | Admitting: Cardiovascular Disease

## 2017-06-29 ENCOUNTER — Encounter: Payer: Self-pay | Admitting: *Deleted

## 2017-06-29 VITALS — BP 132/88 | HR 91 | Ht 67.5 in | Wt 267.2 lb

## 2017-06-29 DIAGNOSIS — I5030 Unspecified diastolic (congestive) heart failure: Secondary | ICD-10-CM

## 2017-06-29 DIAGNOSIS — R0989 Other specified symptoms and signs involving the circulatory and respiratory systems: Secondary | ICD-10-CM

## 2017-06-29 DIAGNOSIS — I5032 Chronic diastolic (congestive) heart failure: Secondary | ICD-10-CM

## 2017-06-29 DIAGNOSIS — Z9989 Dependence on other enabling machines and devices: Secondary | ICD-10-CM

## 2017-06-29 DIAGNOSIS — G4733 Obstructive sleep apnea (adult) (pediatric): Secondary | ICD-10-CM

## 2017-06-29 MED ORDER — AMLODIPINE BESYLATE 5 MG PO TABS: ORAL_TABLET | ORAL | 3 refills | Status: DC

## 2017-06-29 MED ORDER — POTASSIUM CHLORIDE CRYS ER 20 MEQ PO TBCR: EXTENDED_RELEASE_TABLET | ORAL | 5 refills | Status: DC

## 2017-06-29 MED ORDER — TORSEMIDE 20 MG PO TABS: 60.0000 mg | ORAL_TABLET | Freq: Every day | ORAL | 3 refills | Status: DC

## 2017-06-29 NOTE — Patient Instructions (Addendum)

## 2017-12-06 ENCOUNTER — Other Ambulatory Visit: Payer: Self-pay | Admitting: Internal Medicine

## 2018-02-07 ENCOUNTER — Other Ambulatory Visit: Payer: Self-pay | Admitting: Internal Medicine

## 2018-02-07 DIAGNOSIS — R928 Other abnormal and inconclusive findings on diagnostic imaging of breast: Secondary | ICD-10-CM

## 2018-02-07 DIAGNOSIS — R1909 Other intra-abdominal and pelvic swelling, mass and lump: Secondary | ICD-10-CM

## 2018-02-24 ENCOUNTER — Other Ambulatory Visit: Payer: Self-pay | Admitting: Internal Medicine

## 2018-02-24 DIAGNOSIS — N6310 Unspecified lump in the right breast, unspecified quadrant: Secondary | ICD-10-CM

## 2018-03-03 ENCOUNTER — Ambulatory Visit
Admission: RE | Admit: 2018-03-03 | Discharge: 2018-03-03 | Disposition: A | Discharge status: Home / Self Care | Payer: Medicare Other | Source: Ambulatory Visit | Attending: Internal Medicine | Admitting: Internal Medicine

## 2018-03-03 DIAGNOSIS — N6310 Unspecified lump in the right breast, unspecified quadrant: Secondary | ICD-10-CM | POA: Diagnosis not present

## 2018-09-12 NOTE — Progress Notes (Signed)
Cardiology Office Note Date:  09/14/2018  Patient ID:  Jacqueline, Campbell [birth year], MRN [id number] PCP:  Kirk Ruths, MD  Cardiologist:  Dr. Rockey Situ, MD    Chief Complaint: Follow-up  History of Present Illness: Jacqueline Campbell is a 58 y.o. female with history of HFpEF, lower extremity edema, OSA on BiPAP, morbid obesity, hypertension, chronic back pain, peripheral neuropathy, chronic cystitis, depression, and GERD who presents for diastolic heart failure follow-up.  Prior echo from 12/2014 showed an EF of 60 to 65%, no regional wall motion normalities, grade 2 diastolic dysfunction, mildly dilated right ventricle, normal pulmonary arterial pressure.  Her lower extremity swelling has been felt to possibly be multifactorial in the setting of morbid obesity and lymphedema and has intermittently used compression pumps.  She was previously seen in the ER in 09/2016 for LOC in the setting of not eating or drinking for 2 days prior  She was last seen in the office in 06/2017 for routine follow-up and reported significant stressors at home.  Her weight was up 4 pounds from 1 year prior with a weight of 267 pounds.  It was recommended she continue current dose of torsemide as well as lymphedema pumps.  She comes in doing well from a cardiac perspective.  She denies any chest pain, shortness of breath, palpitations, dizziness, presyncope, or syncope.  She continues to take she is under significant stress at home.  She continues to note intermittent lower extremity swelling that is typically worse if she has been sitting or on her feet for extended time frame.  She typically does elevate her legs when sitting.  She uses lymphedema compression pumps approximately 3 times per month and recently has not noted any significant lower extremity swelling.  She has stable four-pillow orthopnea.  Blood pressure is well controlled in the office today though she notes with significant stress at home she can have  systolic readings greater than 200 mmHg.  Tolerating all medications without issues.  She does not have any active cardiac concerns at this time.  Labs: 07/2017 - potassium 4.0, serum creatinine 0.9, AST/ALT normal, albumin 3.9, LDL 81  Past Medical History:  Diagnosis Date   Asthma    Bladder incontinence    Cancer ([place]) 1992   cervical   Chronic diastolic CHF (congestive heart failure) ([place])    a. echo 12/2014: EF 60-65%, no RWMA, GR2DD, RV mildly dilated   Chronic insomnia    Chronic interstitial cystitis    GERD (gastroesophageal reflux disease)    Glaucoma    Hypertension    Metabolic syndrome    Mood disorder ([facility])    Nausea    Chronic   Obesity    Ovarian cancer ([place]) 1994   Plantar fasciitis    Throat cancer ([place]) 1998    Past Surgical History:  Procedure Laterality Date   ABDOMINAL HYSTERECTOMY  1992   BLADDER SURGERY     multiple   BREAST BIOPSY Right 2016   sankar bx, benign   EYE SURGERY Bilateral 2006, 2008, 2010   FOOT SURGERY Bilateral 1996   KNEE SURGERY Left    age 46   [place]    Current Meds  Medication Sig   acyclovir ointment (ZOVIRAX) 5 % Apply 1 application topically 5 (five) times daily as needed.   albuterol (PROVENTIL) (2.5 MG/3ML) 0.083% nebulizer solution Take 2.5 mg by nebulization every 6 (six) hours as needed for wheezing  or shortness of breath.   amLODipine (NORVASC) 5 MG tablet TAKE 1 TABLET BY MOUTH NIGTHLY   budesonide-formoterol (SYMBICORT) 80-4.5 MCG/ACT inhaler Inhale 2 puffs into the lungs 2 (two) times daily.   cloNIDine (CATAPRES - DOSED IN MG/24 HR) 0.1 mg/24hr patch Place 1 patch (0.1 mg total) onto the skin once a week.   diazepam (VALIUM) 5 MG tablet Take 1 tablet (5 mg total) by mouth every 12 (twelve) hours as needed for anxiety. (Patient taking differently: Take 5 mg by mouth every 6 (six) hours as needed for anxiety. )   dorzolamide  (TRUSOPT) 2 % ophthalmic solution Place 1 drop into both eyes 3 (three) times daily.   EPINEPHRINE IJ Inject as directed as needed. Epi pen   escitalopram (LEXAPRO) 10 MG tablet Take by mouth.   esomeprazole (NEXIUM) 40 MG capsule Take 1 capsule (40 mg total) by mouth daily before breakfast.   Fluticasone-Salmeterol (ADVAIR) 500-50 MCG/DOSE AEPB Inhale 1 puff into the lungs every 12 (twelve) hours.   ipratropium-albuterol (DUONEB) 0.5-2.5 (3) MG/3ML SOLN Take 3 mLs by nebulization 2 (two) times daily.   linaclotide (LINZESS) 145 MCG CAPS capsule Take 1 capsule (145 mcg total) by mouth daily.   loratadine (CLARITIN) 10 MG tablet TAKE ONE (1) TABLET EACH DAY   meloxicam (MOBIC) 7.5 MG tablet Take 1 tablet (7.5 mg total) by mouth daily.   mometasone (NASONEX) 50 MCG/ACT nasal spray Place 2 sprays into the nose daily.   montelukast (SINGULAIR) 10 MG tablet Take 1 tablet (10 mg total) by mouth at bedtime.   nadolol (CORGARD) 40 MG tablet Take 1 tablet (40 mg total) by mouth at bedtime.   oxyCODONE (ROXICODONE) 15 MG immediate release tablet Limit 1 tab by mouth 3 - 4  times per day if tolerated (Patient taking differently: Limit 1 tab by mouth 4-6  times per day if tolerated)   pentosan polysulfate (ELMIRON) 100 MG capsule Take 1 capsule (100 mg total) by mouth 3 (three) times daily before meals.   potassium chloride SA (K-DUR,KLOR-CON) 20 MEQ tablet TAKE ONE (1) TABLET EACH DAY   promethazine (PHENERGAN) 25 MG tablet Take 1 tablet (25 mg total) by mouth every 6 (six) hours as needed for nausea.   tiZANidine (ZANAFLEX) 4 MG tablet Limit 1 tablet by mouth 2 -4  times per day if tolerated   torsemide (DEMADEX) 20 MG tablet Take 3 tablets (60 mg total) by mouth daily.   valACYclovir (VALTREX) 500 MG tablet Take 1 tablet (500 mg total) by mouth 1 day or 1 dose.   valsartan-hydrochlorothiazide (DIOVAN-HCT) 320-25 MG tablet Take 1 tablet by mouth daily.   zolpidem (AMBIEN) 5 MG tablet  Take 1 tablet (5 mg total) by mouth at bedtime as needed for sleep.   Current Facility-Administered Medications for the 09/14/18 encounter (Office Visit) with Jacqueline Mu, PA-C  Medication   orphenadrine (NORFLEX) injection 60 mg    Allergies:   Red dye, Abilify [aripiprazole], Amitiza [lubiprostone], Benadryl [diphenhydramine hcl], Doxycycline, Gabapentin, Iohexol, Lac bovis, Risperidone and related, Shellfish allergy, Strawberry (diagnostic), Strawberry extract, Aspirin, Ciprofloxacin, Enablex [darifenacin], Iodine, Latex, Levofloxacin, Penicillins, Risperidone, and Sulfa antibiotics   Social History:  The patient  reports that she has never smoked. She has never used smokeless tobacco. She reports that she does not drink alcohol or use drugs.   Family History:  The patient's family history includes Breast cancer (age of onset: 66) in her sister; Breast cancer (age of onset: 46) in her mother; COPD  in her mother; Cancer in her father; Diabetes in her mother; Heart disease in her father and mother; Hypertension in her mother; Leukemia in her son; Prostate cancer in her brother; Stroke in her mother.  ROS:   Review of Systems  Constitutional: Positive for malaise/fatigue. Negative for chills, diaphoresis, fever and weight loss.  HENT: Negative for congestion.   Eyes: Negative for discharge and redness.  Respiratory: Negative for cough, hemoptysis, sputum production, shortness of breath and wheezing.   Cardiovascular: Positive for leg swelling. Negative for chest pain, palpitations, orthopnea, claudication and PND.  Gastrointestinal: Negative for abdominal pain, blood in stool, heartburn, melena, nausea and vomiting.  Genitourinary: Negative for hematuria.  Musculoskeletal: Negative for falls and myalgias.  Skin: Negative for rash.  Neurological: Negative for dizziness, tingling, tremors, sensory change, speech change, focal weakness, loss of consciousness and weakness.  Endo/Heme/Allergies:  Does not bruise/bleed easily.  Psychiatric/Behavioral: Negative for substance abuse. The patient is not nervous/anxious.        Significant home stressors  All other systems reviewed and are negative.    PHYSICAL EXAM:  VS:  BP 120/86 (BP Location: Left Arm, Patient Position: Sitting, Cuff Size: Large)    Pulse 98    Temp (!) 97.1 F (36.2 C)    Ht 5' 7.5" (1.715 m)    Wt 268 lb 8 oz (121.8 kg)    SpO2 93%    BMI 41.43 kg/m  BMI: Body mass index is 41.43 kg/m.  Physical Exam  Constitutional: She is oriented to person, place, and time. She appears well-developed and well-nourished.  HENT:  Head: Normocephalic and atraumatic.  Eyes: Right eye exhibits no discharge. Left eye exhibits no discharge.  Neck: Normal range of motion. No JVD present.  Cardiovascular: Normal rate, regular rhythm, S1 normal, S2 normal and normal heart sounds. Exam reveals no distant heart sounds, no friction rub, no midsystolic click and no opening snap.  No murmur heard. Pulses:      Dorsalis pedis pulses are 2+ on the right side and 2+ on the left side.       Posterior tibial pulses are 2+ on the right side and 2+ on the left side.  Pulmonary/Chest: Effort normal and breath sounds normal. No respiratory distress. She has no decreased breath sounds. She has no wheezes. She has no rales. She exhibits no tenderness.  Abdominal: Soft. She exhibits no distension. There is no abdominal tenderness.  Musculoskeletal:        General: No edema.  Neurological: She is alert and oriented to person, place, and time.  Skin: Skin is warm and dry. No cyanosis. Nails show no clubbing.  Psychiatric: She has a normal mood and affect. Her speech is normal and behavior is normal. Judgment and thought content normal.     EKG:  Was ordered and interpreted by me today. Shows NSR, 98 bpm, normal axis, no acute ST-T changes  Recent Labs: No results found for requested labs within last 8760 hours.  No results found for requested labs  within last 8760 hours.   CrCl cannot be calculated (Patient's most recent lab result is older than the maximum 21 days allowed.).   Wt Readings from Last 3 Encounters:  09/14/18 268 lb 8 oz (121.8 kg)  06/29/17 267 lb 4 oz (121.2 kg)  10/05/16 240 lb (108.9 kg)     Other studies reviewed: Additional studies/records reviewed today include: summarized above  ASSESSMENT AND PLAN:  1. HFpEF: She appears grossly euvolemic and well compensated  today.  She remains on torsemide 60 mg daily with KCl repletion.  Her weight is up 1 pound from her visit 1 year ago.  CHF education provided.  Check CMP.  2. Lower extremity edema: Likely multifactorial including morbid obesity, venous insufficiency, and lymphedema.  Continue with lymphedema compression pumps as directed by outside office.  Recommend leg elevation.  3. Morbid obesity: Weight loss is advised.  4. Sleep apnea: Compliant with BiPAP.  5. Hypertension: Well-controlled in the office today.  She reports significant elevations in blood pressure in stressful situations at home.  Try minimize stressors.  Disposition: F/u with Dr. Rockey Situ or an APP in 12 months.  Current medicines are reviewed at length with the patient today.  The patient did not have any concerns regarding medicines.  Signed, Christell Faith, PA-C 09/14/2018 2:24 PM     [facility] [address] [place] [address] [place], [place] [address] [phone]

## 2018-09-14 ENCOUNTER — Telehealth: Payer: Self-pay | Admitting: Cardiovascular Disease

## 2018-09-14 ENCOUNTER — Other Ambulatory Visit: Payer: Self-pay

## 2018-09-14 ENCOUNTER — Encounter: Payer: Self-pay | Admitting: Physician Assistant

## 2018-09-14 ENCOUNTER — Ambulatory Visit (INDEPENDENT_AMBULATORY_CARE_PROVIDER_SITE_OTHER): Payer: Medicare Other | Admitting: Physician Assistant

## 2018-09-14 VITALS — BP 120/86 | HR 98 | Temp 97.1°F | Ht 67.5 in | Wt 268.5 lb

## 2018-09-14 DIAGNOSIS — M7989 Other specified soft tissue disorders: Secondary | ICD-10-CM

## 2018-09-14 DIAGNOSIS — I5032 Chronic diastolic (congestive) heart failure: Secondary | ICD-10-CM

## 2018-09-14 DIAGNOSIS — G4733 Obstructive sleep apnea (adult) (pediatric): Secondary | ICD-10-CM

## 2018-09-14 DIAGNOSIS — I10 Essential (primary) hypertension: Secondary | ICD-10-CM

## 2018-09-14 MED ORDER — POTASSIUM CHLORIDE CRYS ER 20 MEQ PO TBCR: EXTENDED_RELEASE_TABLET | ORAL | 5 refills | Status: DC

## 2018-09-14 MED ORDER — TORSEMIDE 20 MG PO TABS: 60.0000 mg | ORAL_TABLET | Freq: Every day | ORAL | 3 refills | Status: DC

## 2018-09-14 NOTE — Telephone Encounter (Signed)

## 2018-09-14 NOTE — Patient Instructions (Signed)
Medication Instructions:  Your physician recommends that you continue on your current medications as directed. Please refer to the Current Medication list given to you today.  If you need a refill on your cardiac medications before your next appointment, please call your pharmacy.   Lab work: Your physician recommends that you have lab work today(CMET).   If you have labs (blood work) drawn today and your tests are completely normal, you will receive your results only by: Marland Kitchen MyChart Message (if you have MyChart) OR . A paper copy in the mail If you have any lab test that is abnormal or we need to change your treatment, we will call you to review the results.  Testing/Procedures: None ordered   Follow-Up: At [facility], you and your health needs are our priority.  As part of our continuing mission to provide you with exceptional heart care, we have created designated Provider Care Teams.  These Care Teams include your primary Cardiologist (physician) and Advanced Practice Providers (APPs -  Physician Assistants and Nurse Practitioners) who all work together to provide you with the care you need, when you need it. You will need a follow up appointment in 12 months.  Please call our office 2 months in advance to schedule this appointment.  Please see Dr. Rockey Situ.

## 2018-09-15 ENCOUNTER — Telehealth: Payer: Self-pay

## 2018-09-15 LAB — COMPREHENSIVE METABOLIC PANEL
ALT: 25 IU/L (ref 0–32)
AST: 24 IU/L (ref 0–40)
Albumin/Globulin Ratio: 1.8 (ref 1.2–2.2)
Albumin: 4.4 g/dL (ref 3.8–4.9)
Alkaline Phosphatase: 88 IU/L (ref 39–117)
BUN/Creatinine Ratio: 18 (ref 9–23)
BUN: 16 mg/dL (ref 6–24)
Bilirubin Total: <0.2 mg/dL (ref 0.0–1.2)
CO2: 24 mmol/L (ref 20–29)
Calcium: 9.5 mg/dL (ref 8.7–10.2)
Chloride: 103 mmol/L (ref 96–106)
Creatinine, Ser: 0.91 mg/dL (ref 0.57–1.00)
GFR calc Af Amer: 82 mL/min/{1.73_m2} (ref >59)
GFR calc non Af Amer: 71 mL/min/{1.73_m2} (ref >59)
Globulin, Total: 2.5 g/dL (ref 1.5–4.5)
Glucose: 97 mg/dL (ref 65–99)
Potassium: 4.4 mmol/L (ref 3.5–5.2)
Sodium: 142 mmol/L (ref 134–144)
Total Protein: 6.9 g/dL (ref 6.0–8.5)

## 2018-09-15 NOTE — Telephone Encounter (Signed)
Notes recorded by Frederik Schmidt, RN on 09/15/2018 at 8:49 AM EDT  The patient has been notified of the result and verbalized understanding. All questions (if any) were answered.  Frederik Schmidt, RN 09/15/2018 8:49 AM   ------

## 2018-09-15 NOTE — Telephone Encounter (Signed)
-----   Message from Rise Mu, PA-C sent at 09/15/2018  7:23 AM EDT ----- Renal function normal.  Potassium improved and at goal.  Liver function normal.  Random glucose normal.

## 2018-11-22 ENCOUNTER — Ambulatory Visit: Payer: Medicare Other | Admitting: Urology

## 2018-11-23 ENCOUNTER — Other Ambulatory Visit: Payer: Self-pay

## 2018-11-23 ENCOUNTER — Encounter: Payer: Self-pay | Admitting: Urology

## 2018-11-23 ENCOUNTER — Ambulatory Visit (INDEPENDENT_AMBULATORY_CARE_PROVIDER_SITE_OTHER): Payer: Medicare Other | Admitting: Urology

## 2018-11-23 VITALS — BP 113/75 | HR 93 | Ht 67.5 in | Wt 268.0 lb

## 2018-11-23 DIAGNOSIS — R3913 Splitting of urinary stream: Secondary | ICD-10-CM

## 2018-11-23 DIAGNOSIS — R300 Dysuria: Secondary | ICD-10-CM | POA: Diagnosis not present

## 2018-11-23 DIAGNOSIS — N3010 Interstitial cystitis (chronic) without hematuria: Secondary | ICD-10-CM

## 2018-11-23 DIAGNOSIS — N3941 Urge incontinence: Secondary | ICD-10-CM | POA: Diagnosis not present

## 2018-11-23 NOTE — Progress Notes (Signed)
11/23/2018 9:04 AM   Stann Ore Esh 02/06/1961 [id number]  Referring provider: Kirk Ruths, MD [place] [facility] [place],  [place] [address]  Chief Complaint  Patient presents with   Cystitis    HPI: Jacqueline Campbell is a 58 y.o. female seen at the request of Dr. Ouida Sills for chronic cystitis.  She presents with an approximately 1 month history of dysuria and urge incontinence.  She has been on extended release oxybutynin 10 mg for the past 30 days and has not seen any improvement in her symptoms.  She denies gross hematuria.  She does complain of intermittent urinary stream and straining to urinate.  She has a history of interstitial cystitis and entry in [place] in 2012 remarkable for being on hydroxyzine and Elmiron.  She has been of these medications for several years.  She apparently has had an InterStim in the past which was removed.  Her symptoms were quiesced sent until recently.   PMH: Past Medical History:  Diagnosis Date   Asthma    Bladder incontinence    Cancer ([place]) 1992   cervical   Chronic diastolic CHF (congestive heart failure) ([place])    a. echo 12/2014: EF 60-65%, no RWMA, GR2DD, RV mildly dilated   Chronic insomnia    Chronic interstitial cystitis    GERD (gastroesophageal reflux disease)    Glaucoma    Hypertension    Metabolic syndrome    Mood disorder (HCC)    Nausea    Chronic   Obesity    Ovarian cancer ([place]) 1994   Plantar fasciitis    Throat cancer ([place]) 1998    Surgical History: Past Surgical History:  Procedure Laterality Date   ABDOMINAL HYSTERECTOMY  1992   BLADDER SURGERY     multiple   BREAST BIOPSY Right 2016   sankar bx, benign   EYE SURGERY Bilateral 2006, 2008, 2010   FOOT SURGERY Bilateral 1996   KNEE SURGERY Left    age 41   [place] Medications:  Allergies as of 11/23/2018      Reactions   Red Dye  Anaphylaxis   Abilify [aripiprazole]    Amitiza [lubiprostone]    Benadryl [diphenhydramine Hcl]    Doxycycline    Gabapentin    Iohexol    Lac Bovis Swelling   Risperidone And Related    Shellfish Allergy    Strawberry (diagnostic) Swelling   Strawberry Extract Swelling   Aspirin Rash   Ciprofloxacin Rash   Enablex [darifenacin] Rash   Iodine Rash   Latex Rash   Levofloxacin Rash   Penicillins Rash   Risperidone Rash   Sulfa Antibiotics Rash      Medication List       Accurate as of November 23, 2018  9:04 AM. If you have any questions, ask your nurse or doctor.        STOP taking these medications   hydrochlorothiazide 25 MG tablet Commonly known as: HYDRODIURIL Stopped by: Abbie Sons, MD   oxybutynin 10 MG 24 hr tablet Commonly known as: DITROPAN-XL Stopped by: Abbie Sons, MD     TAKE these medications   acyclovir ointment 5 % Commonly known as: ZOVIRAX Apply 1 application topically 5 (five) times daily as needed.   albuterol (2.5 MG/3ML) 0.083% nebulizer solution Commonly known as: PROVENTIL Take 2.5 mg by nebulization every 6 (six) hours as needed for  wheezing or shortness of breath.   amLODipine 5 MG tablet Commonly known as: NORVASC TAKE 1 TABLET BY MOUTH NIGTHLY   budesonide-formoterol 80-4.5 MCG/ACT inhaler Commonly known as: SYMBICORT Inhale 2 puffs into the lungs 2 (two) times daily.   cloNIDine 0.1 mg/24hr patch Commonly known as: CATAPRES - Dosed in mg/24 hr Place 1 patch (0.1 mg total) onto the skin once a week.   cloNIDine 0.1 mg/24hr patch Commonly known as: CATAPRES - Dosed in mg/24 hr   diazepam 5 MG tablet Commonly known as: VALIUM Take 1 tablet (5 mg total) by mouth every 12 (twelve) hours as needed for anxiety. What changed: when to take this   dorzolamide 2 % ophthalmic solution Commonly known as: TRUSOPT Place 1 drop into both eyes 3 (three) times daily.   EPINEPHRINE IJ Inject as directed as needed. Epi pen     escitalopram 10 MG tablet Commonly known as: LEXAPRO Take by mouth.   esomeprazole 40 MG capsule Commonly known as: NEXIUM Take 1 capsule (40 mg total) by mouth daily before breakfast.   Fluticasone-Salmeterol 500-50 MCG/DOSE Aepb Commonly known as: ADVAIR Inhale 1 puff into the lungs every 12 (twelve) hours.   ipratropium-albuterol 0.5-2.5 (3) MG/3ML Soln Commonly known as: DUONEB Take 3 mLs by nebulization 2 (two) times daily.   linaclotide 145 MCG Caps capsule Commonly known as: Linzess Take 1 capsule (145 mcg total) by mouth daily.   loratadine 10 MG tablet Commonly known as: CLARITIN TAKE ONE (1) TABLET EACH DAY   meloxicam 7.5 MG tablet Commonly known as: MOBIC Take 1 tablet (7.5 mg total) by mouth daily.   metroNIDAZOLE 0.75 % vaginal gel Commonly known as: METROGEL   mometasone 50 MCG/ACT nasal spray Commonly known as: Nasonex Place 2 sprays into the nose daily.   montelukast 10 MG tablet Commonly known as: SINGULAIR Take 1 tablet (10 mg total) by mouth at bedtime.   nadolol 40 MG tablet Commonly known as: CORGARD Take 1 tablet (40 mg total) by mouth at bedtime.   oxyCODONE 15 MG immediate release tablet Commonly known as: ROXICODONE Limit 1 tab by mouth 3 - 4  times per day if tolerated What changed: additional instructions   pentosan polysulfate 100 MG capsule Commonly known as: ELMIRON Take 1 capsule (100 mg total) by mouth 3 (three) times daily before meals.   potassium chloride SA 20 MEQ tablet Commonly known as: K-DUR TAKE ONE (1) TABLET EACH DAY   promethazine 25 MG tablet Commonly known as: PHENERGAN Take 1 tablet (25 mg total) by mouth every 6 (six) hours as needed for nausea.   tiZANidine 4 MG tablet Commonly known as: ZANAFLEX Limit 1 tablet by mouth 2 -4  times per day if tolerated   torsemide 20 MG tablet Commonly known as: DEMADEX Take 3 tablets (60 mg total) by mouth daily.   valACYclovir 500 MG tablet Commonly known as:  VALTREX Take 1 tablet (500 mg total) by mouth 1 day or 1 dose.   valsartan-hydrochlorothiazide 320-25 MG tablet Commonly known as: DIOVAN-HCT Take 1 tablet by mouth daily.   zolpidem 5 MG tablet Commonly known as: AMBIEN Take 1 tablet (5 mg total) by mouth at bedtime as needed for sleep.       Allergies:  Allergies  Allergen Reactions   Red Dye Anaphylaxis   Abilify [Aripiprazole]    Amitiza [Lubiprostone]    Benadryl [Diphenhydramine Hcl]    Doxycycline    Gabapentin    Iohexol    Lac  Bovis Swelling   Risperidone And Related    Shellfish Allergy    Strawberry (Diagnostic) Swelling   Strawberry Extract Swelling   Aspirin Rash   Ciprofloxacin Rash   Enablex [Darifenacin] Rash   Iodine Rash   Latex Rash   Levofloxacin Rash   Penicillins Rash   Risperidone Rash   Sulfa Antibiotics Rash    Family History: Family History  Problem Relation Age of Onset   Breast cancer Mother 48       eye and ovarian   Hypertension Mother    Diabetes Mother    Heart disease Mother    COPD Mother    Stroke Mother    Cancer Father        Colon and Prostate   Heart disease Father    Leukemia Son    Prostate cancer Brother    Breast cancer Sister 5    Social History:  reports that she has never smoked. She has never used smokeless tobacco. She reports that she does not drink alcohol or use drugs.  ROS: UROLOGY Frequent Urination?: Yes Hard to postpone urination?: Yes Burning/pain with urination?: Yes Get up at night to urinate?: Yes Leakage of urine?: Yes Urine stream starts and stops?: Yes Trouble starting stream?: No Do you have to strain to urinate?: Yes Blood in urine?: No Urinary tract infection?: Yes Sexually transmitted disease?: No Injury to kidneys or bladder?: No Painful intercourse?: No Weak stream?: No Currently pregnant?: No Vaginal bleeding?: No Last menstrual period?: n  Gastrointestinal Nausea?: No Vomiting?:  No Indigestion/heartburn?: No Diarrhea?: No Constipation?: No  Constitutional Fever: No Night sweats?: Yes Weight loss?: No Fatigue?: Yes  Skin Skin rash/lesions?: No Itching?: No  Eyes Blurred vision?: Yes Double vision?: No  Ears/Nose/Throat Sore throat?: No Sinus problems?: No  Hematologic/Lymphatic Swollen glands?: No Easy bruising?: Yes  Cardiovascular Leg swelling?: Yes Chest pain?: No  Respiratory Cough?: No Shortness of breath?: No  Endocrine Excessive thirst?: No  Musculoskeletal Back pain?: Yes Joint pain?: Yes  Neurological Headaches?: Yes Dizziness?: No  Psychologic Depression?: Yes Anxiety?: No  Physical Exam: BP 113/75 (BP Location: Left Arm, Patient Position: Sitting, Cuff Size: Large)    Pulse 93    Ht 5' 7.5" (1.715 m)    Wt 268 lb (121.6 kg)    BMI 41.36 kg/m   Constitutional:  Alert and oriented, No acute distress. HEENT: [place] AT, moist mucus membranes.  Trachea midline, no masses. Cardiovascular: No clubbing, cyanosis, or edema. Respiratory: Normal respiratory effort, no increased work of breathing. GI: Abdomen is soft, nontender, nondistended, no abdominal masses GU: No CVA tenderness Lymph: No cervical or inguinal lymphadenopathy. Skin: No rashes, bruises or suspicious lesions. Neurologic: Grossly intact, no focal deficits, moving all 4 extremities. Psychiatric: Normal mood and affect.  Laboratory Data:  Urinalysis Dipstick/microscopy negative   Assessment & Plan:    Ms. Felgar has a long history of interstitial cystitis with recent onset of dysuria and obstructive/storage related voiding symptoms.  No significant pelvic pain.  Urinalysis today is clear.  Recommend scheduling office cystoscopy.  Trial Myrbetriq 25 mg daily-samples given.  She will discontinue the oxybutynin.   Abbie Sons, [place] [address], [place],  [address] [phone]

## 2018-11-24 LAB — URINALYSIS, COMPLETE
Bilirubin, UA: NEGATIVE
Glucose, UA: NEGATIVE
Ketones, UA: NEGATIVE
Nitrite, UA: NEGATIVE
Protein,UA: NEGATIVE
RBC, UA: NEGATIVE
Specific Gravity, UA: 1.015 (ref 1.005–1.030)
Urobilinogen, Ur: 0.2 mg/dL (ref 0.2–1.0)
pH, UA: 5.5 (ref 5.0–7.5)

## 2018-11-24 LAB — MICROSCOPIC EXAMINATION
Bacteria, UA: NONE SEEN
RBC, Urine: NONE SEEN /hpf (ref 0–2)

## 2018-12-21 ENCOUNTER — Encounter: Payer: Self-pay | Admitting: Urology

## 2018-12-21 ENCOUNTER — Other Ambulatory Visit: Payer: Self-pay

## 2018-12-21 ENCOUNTER — Ambulatory Visit (INDEPENDENT_AMBULATORY_CARE_PROVIDER_SITE_OTHER): Payer: Medicare Other | Admitting: Urology

## 2018-12-21 VITALS — BP 130/81 | HR 91 | Ht 67.5 in | Wt 268.0 lb

## 2018-12-21 DIAGNOSIS — R3915 Urgency of urination: Secondary | ICD-10-CM | POA: Diagnosis not present

## 2018-12-21 DIAGNOSIS — R3913 Splitting of urinary stream: Secondary | ICD-10-CM

## 2018-12-21 DIAGNOSIS — N3010 Interstitial cystitis (chronic) without hematuria: Secondary | ICD-10-CM

## 2018-12-21 LAB — MICROSCOPIC EXAMINATION: RBC, Urine: NONE SEEN /hpf (ref 0–2)

## 2018-12-21 LAB — URINALYSIS, COMPLETE
Bilirubin, UA: NEGATIVE
Glucose, UA: NEGATIVE
Leukocytes,UA: NEGATIVE
Nitrite, UA: NEGATIVE
RBC, UA: NEGATIVE
Specific Gravity, UA: 1.025 (ref 1.005–1.030)
Urobilinogen, Ur: 1 mg/dL (ref 0.2–1.0)
pH, UA: 6 (ref 5.0–7.5)

## 2018-12-21 MED ORDER — OXYBUTYNIN CHLORIDE ER 15 MG PO TB24: 15.0000 mg | ORAL_TABLET | Freq: Every day | ORAL | 1 refills | Status: DC

## 2018-12-21 MED ORDER — LIDOCAINE HCL URETHRAL/MUCOSAL 2 % EX GEL
1.0000 "application " | Freq: Once | CUTANEOUS | Status: AC
  Administered 2018-12-21: 1 via URETHRAL

## 2018-12-21 NOTE — Progress Notes (Signed)
   12/21/18  CC:  Chief Complaint  Patient presents with  . Cysto    HPI: Refer to my previous note 11/23/2018.  She did not see improvement in her urinary symptoms on Myrbetriq and states she had hallucinations.  She is no longer taking the medication.  There were no vitals taken for this visit. NED. A&Ox3.   No respiratory distress   Abd soft, NT, ND Atrophic external genitalia with patent urethral meatus.  No cystocele or rectocele  Cystoscopy Procedure Note  Patient identification was confirmed, informed consent was obtained, and patient was prepped using Betadine solution.  Lidocaine jelly was administered per urethral meatus.    Procedure: - Flexible cystoscope introduced, without any difficulty.   - Thorough search of the bladder revealed:    normal urethral meatus    normal urothelium    no stones    no ulcers     no tumors    no urethral polyps    no trabeculation  - Ureteral orifices were normal in position and appearance.  Post-Procedure: - Patient tolerated the procedure well  Assessment/ Plan: No abnormalities identified on cystoscopy.  Will restart oxybutynin increased dose to 15 mg.  She will call back in 1 month regarding efficacy.  She was given a brochure on PTNS.  If the oxybutynin is not beneficial she may be interested in starting.   Abbie Sons, MD

## 2019-03-01 ENCOUNTER — Other Ambulatory Visit: Payer: Self-pay | Admitting: Internal Medicine

## 2019-03-01 DIAGNOSIS — Z1231 Encounter for screening mammogram for malignant neoplasm of breast: Secondary | ICD-10-CM

## 2019-04-05 ENCOUNTER — Other Ambulatory Visit: Payer: Self-pay | Admitting: Urology

## 2019-05-31 ENCOUNTER — Other Ambulatory Visit: Payer: Self-pay | Admitting: Urology

## 2019-06-12 ENCOUNTER — Ambulatory Visit: Payer: Medicare Other | Attending: Internal Medicine

## 2019-06-12 DIAGNOSIS — Z23 Encounter for immunization: Secondary | ICD-10-CM

## 2019-06-12 NOTE — Progress Notes (Signed)
   Covid-19 Vaccination Clinic  Name:  Jacqueline Campbell    MRN: [id number] DOB: [birth year]  06/12/2019  Jacqueline Campbell was observed post Covid-19 immunization for 15 minutes without incident. She was provided with Vaccine Information Sheet and instruction to access the V-Safe system.   Jacqueline Campbell was instructed to call 911 with any severe reactions post vaccine: Marland Kitchen Difficulty breathing  . Swelling of face and throat  . A fast heartbeat  . A bad rash all over body  . Dizziness and weakness   Immunizations Administered    Name Date Dose VIS Date Route   Pfizer COVID-19 Vaccine 06/12/2019  9:08 AM 0.3 mL 02/24/2019 Intramuscular   Manufacturer: [place]   Lot: [id number]   [place]: [id number]

## 2019-07-03 ENCOUNTER — Ambulatory Visit: Payer: Medicare Other | Attending: Internal Medicine

## 2019-07-03 DIAGNOSIS — Z23 Encounter for immunization: Secondary | ICD-10-CM

## 2019-07-03 NOTE — Progress Notes (Signed)
   Covid-19 Vaccination Clinic  Name:  Jacqueline Campbell    MRN: [id number] DOB: [birth year]  07/03/2019  [name] was observed post Covid-19 immunization for 15 minutes without incident. She was provided with Vaccine Information Sheet and instruction to access the V-Safe system.   [name] was instructed to call 911 with any severe reactions post vaccine: Marland Kitchen Difficulty breathing  . Swelling of face and throat  . A fast heartbeat  . A bad rash all over body  . Dizziness and weakness   Immunizations Administered    Name Date Dose VIS Date Route   Pfizer COVID-19 Vaccine 07/03/2019  9:00 AM 0.3 mL 05/10/2018 Intramuscular   Manufacturer: [employer]   Lot: [id number]   [place]: [id number]

## 2020-05-07 ENCOUNTER — Telehealth: Payer: Self-pay | Admitting: Cardiovascular Disease

## 2020-05-07 NOTE — Telephone Encounter (Signed)
Scheduled in march

## 2020-05-07 NOTE — Telephone Encounter (Signed)
*  STAT* If patient is at the pharmacy, call can be transferred to refill team.   1. Which medications need to be refilled? (please list name of each medication and dose if known)   No idea names has been out for 2 months  2. Which pharmacy/location (including street and city if local pharmacy) is medication to be sent to?  [facility]   3. Do they need a 30 day or 90 day supply? Jacqueline Campbell

## 2020-05-07 NOTE — Telephone Encounter (Signed)
Patient was last seen in 2020 and will need an appointment before any refills. We have not refilled anything for her since 2019.

## 2020-06-09 NOTE — Progress Notes (Signed)
Cardiology Office Note    Date:  06/10/2020   ID:  Jacqueline Campbell, DOB February 24, 1961, MRN [id number]  PCP:  Kirk Ruths, MD  Cardiologist:  Ida Rogue, MD  Electrophysiologist:  None   Chief Complaint: Follow up  History of Present Illness:   Jacqueline Campbell is a 60 y.o. female with history of HFpEF, lower extremity edema, OSA on BiPAP, morbid obesity, hypertension, chronic back pain, peripheral neuropathy, chronic cystitis, depression, and GERD who presents for diastolic heart failure follow-up.  Prior echo from 12/2014 showed an EF of 60 to 65%, no regional wall motion abnormalities, grade 2 diastolic dysfunction, mildly dilated right ventricle, no significant valvular abnormalities, and normal pulmonary arterial pressure.  Her lower extremity swelling has been felt to be multifactorial in the setting of morbid obesity and lymphedema.  She has intermittently used compression pumps.  She was previously seen in the ER in 09/2016 for LOC in the setting of not eating or drinking for 2 days prior.  She was last seen in the office in 09/2018 and was doing well from a cardiac perspective.  She continued to note significant stressors at home.  Her lower extremity swelling continued to be intermittent and was worse if she was up on her feet for extended timeframe.  She was using her lymphedema pumps approximately 3 times per month.  She was euvolemic.  She comes in today accompanied by her brother and is doing well from a cardiac perspective.  Since she was last seen she does continue to have significant stressors at home and reports 20 deaths in the family since 2020.  Despite this, her spirits remain good.  Her weight remains stable.  She uses her lymphedema compression pumps approximately 2-3 times per month with no significant lower extremity swelling at this time.  Blood pressure continues to be well controlled.  She is tolerating all cardiac medications without issues.  No chest pain,  dyspnea, palpitations, dizziness, presyncope, or syncope.  She has stable two-pillow orthopnea and denies any PND or early satiety.  She does try and watch her salt and fluid intake.  She does not have any active cardiac concerns or issues at this time.   Labs independently reviewed: 02/2020 - potassium 4.1, BUN 21, SCr 1.0, albumin 4.3, AST/ALT normal, TC 160, TG 218, HDL 38, LDL 78 09/2016 - Hgb 13.9, PLT 330 11/2014 - TSH normal  Past Medical History:  Diagnosis Date  . Asthma   . Bladder incontinence   . Cancer ([place]) 1992   cervical  . Chronic diastolic CHF (congestive heart failure) ([place])    a. echo 12/2014: EF 60-65%, no RWMA, GR2DD, RV mildly dilated  . Chronic insomnia   . Chronic interstitial cystitis   . GERD (gastroesophageal reflux disease)   . Glaucoma   . Hypertension   . Metabolic syndrome   . Mood disorder ([place])   . Nausea    Chronic  . Obesity   . Ovarian cancer ([place]) 1994  . Plantar fasciitis   . 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 ABDOMINAL HYSTERECTOMY  1992  . BLADDER SURGERY     multiple  . BREAST BIOPSY Right 2016   sankar bx, benign  . EYE SURGERY Bilateral 2006, 2008, 2010  . FOOT SURGERY Bilateral 1996  . KNEE SURGERY Left    age 29  . TONSILLECTOMY AND ADENOIDECTOMY  1994  . [place]  Current Medications: Current Meds  Medication Sig  . acyclovir ointment (ZOVIRAX) 5 % Apply 1 application topically 5 (five) times daily as needed.  Marland Kitchen albuterol (PROVENTIL) (2.5 MG/3ML) 0.083% nebulizer solution Take 2.5 mg by nebulization every 6 (six) hours as needed for wheezing or shortness of breath.  . budesonide-formoterol (SYMBICORT) 80-4.5 MCG/ACT inhaler Inhale 2 puffs into the lungs 2 (two) times daily.  . cloNIDine (CATAPRES - DOSED IN MG/24 HR) 0.1 mg/24hr patch Place 1 patch (0.1 mg total) onto the skin once a week.  . cloNIDine (CATAPRES - DOSED IN MG/24 HR) 0.1 mg/24hr patch Place 1  patch (0.1 mg total) onto the skin once a week.  . diazepam (VALIUM) 5 MG tablet Take 1 tablet (5 mg total) by mouth every 12 (twelve) hours as needed for anxiety.  . dorzolamide (TRUSOPT) 2 % ophthalmic solution Place 1 drop into both eyes 3 (three) times daily.  Marland Kitchen EPINEPHRINE IJ Inject as directed as needed. Epi pen  . escitalopram (LEXAPRO) 10 MG tablet Take by mouth.  . esomeprazole (NEXIUM) 40 MG capsule Take 1 capsule (40 mg total) by mouth daily before breakfast.  . Fluticasone-Salmeterol (ADVAIR) 500-50 MCG/DOSE AEPB Inhale 1 puff into the lungs every 12 (twelve) hours.  Marland Kitchen ipratropium-albuterol (DUONEB) 0.5-2.5 (3) MG/3ML SOLN Take 3 mLs by nebulization 2 (two) times daily.  Marland Kitchen linaclotide (LINZESS) 145 MCG CAPS capsule Take 1 capsule (145 mcg total) by mouth daily.  Marland Kitchen loratadine (CLARITIN) 10 MG tablet TAKE ONE (1) TABLET EACH DAY  . meloxicam (MOBIC) 7.5 MG tablet Take 1 tablet (7.5 mg total) by mouth daily.  . metroNIDAZOLE (METROGEL) 0.75 % vaginal gel   . mometasone (NASONEX) 50 MCG/ACT nasal spray Place 2 sprays into the nose daily.  . montelukast (SINGULAIR) 10 MG tablet Take 1 tablet (10 mg total) by mouth at bedtime.  Marland Kitchen oxybutynin (DITROPAN XL) 15 MG 24 hr tablet TAKE 1 TABLET BY MOUTH DAILY  . oxyCODONE (ROXICODONE) 15 MG immediate release tablet Limit 1 tab by mouth 3 - 4  times per day if tolerated (Patient taking differently: Limit 1 tab by mouth 3 - 4  times per day if tolerated)  . promethazine (PHENERGAN) 25 MG tablet Take 1 tablet (25 mg total) by mouth every 6 (six) hours as needed for nausea.  Marland Kitchen tiZANidine (ZANAFLEX) 4 MG tablet Limit 1 tablet by mouth 2 -4  times per day if tolerated  . topiramate (TOPAMAX) 25 MG tablet Take 25 mg by mouth 2 (two) times daily.  . valACYclovir (VALTREX) 500 MG tablet Take 1 tablet (500 mg total) by mouth 1 day or 1 dose.  . zolpidem (AMBIEN) 5 MG tablet Take 1 tablet (5 mg total) by mouth at bedtime as needed for sleep.  .  [DISCONTINUED] amLODipine (NORVASC) 5 MG tablet TAKE 1 TABLET BY MOUTH NIGTHLY  . [DISCONTINUED] nadolol (CORGARD) 40 MG tablet Take 1 tablet (40 mg total) by mouth at bedtime.  . [DISCONTINUED] potassium chloride SA (K-DUR) 20 MEQ tablet TAKE ONE (1) TABLET EACH DAY  . [DISCONTINUED] torsemide (DEMADEX) 20 MG tablet Take 3 tablets (60 mg total) by mouth daily.  . [DISCONTINUED] valsartan-hydrochlorothiazide (DIOVAN-HCT) 320-25 MG tablet Take 1 tablet by mouth daily.   Current Facility-Administered Medications for the 06/10/20 encounter (Office Visit) with Rise Mu, PA-C  Medication  . orphenadrine (NORFLEX) injection 60 mg    Allergies:   Red dye, Abilify [aripiprazole], Amitiza [lubiprostone], Benadryl [diphenhydramine hcl], Doxycycline, Gabapentin, Iohexol, Lac bovis, Risperidone and  related, Shellfish allergy, Strawberry (diagnostic), Strawberry extract, Aspirin, Ciprofloxacin, Enablex [darifenacin], Iodine, Latex, Levofloxacin, Penicillins, Risperidone, and Sulfa antibiotics   Social History   Socioeconomic History  . Marital status: Single    Spouse name: Not on file  . Number of children: Not on file  . Years of education: Not on file  . Highest education level: Not on file  Occupational History  . Not on file  Tobacco Use  . Smoking status: Never Smoker  . Smokeless tobacco: Never Used  Vaping Use  . Vaping Use: Never used  Substance and Sexual Activity  . Alcohol use: No    Alcohol/week: 0.0 standard drinks  . Drug use: No  . Sexual activity: Not Currently    Birth control/protection: Surgical  Other Topics Concern  . Not on file  Social History Narrative  . Not on file   Social Determinants of Health   Financial Resource Strain: Not on file  Food Insecurity: Not on file  Transportation Needs: Not on file  Physical Activity: Not on file  Stress: Not on file  Social Connections: Not on file     Family History:  The patient's family history includes Breast  cancer (age of onset: 64) in her sister; Breast cancer (age of onset: 55) in her mother; COPD in her mother; Cancer in her father; Diabetes in her mother; Heart disease in her father and mother; Hypertension in her mother; Leukemia in her son; Prostate cancer in her brother; Stroke in her mother.  ROS:   Review of Systems  Constitutional: Positive for malaise/fatigue. Negative for chills, diaphoresis, fever and weight loss.  HENT: Negative for congestion.   Eyes: Negative for discharge and redness.  Respiratory: Negative for cough, sputum production, shortness of breath and wheezing.   Cardiovascular: Positive for leg swelling. Negative for chest pain, palpitations, orthopnea, claudication and PND.  Gastrointestinal: Negative for abdominal pain, heartburn, nausea and vomiting.  Musculoskeletal: Negative for falls and myalgias.  Skin: Negative for rash.  Neurological: Negative for dizziness, tingling, tremors, sensory change, speech change, focal weakness, loss of consciousness and weakness.  Endo/Heme/Allergies: Does not bruise/bleed easily.  Psychiatric/Behavioral: Negative for substance abuse. The patient is not nervous/anxious.   All other systems reviewed and are negative.    EKGs/Labs/Other Studies Reviewed:    Studies reviewed were summarized above. The additional studies were reviewed today:  2D echo 12/2014: - Left ventricle: The cavity size was normal. There was mild focal  basal hypertrophy of the septum. Systolic function was normal.  The estimated ejection fraction was in the range of 60% to 65%.  Wall motion was normal; there were no regional wall motion  abnormalities. Features are consistent with a pseudonormal left  ventricular filling pattern, with concomitant abnormal relaxation  and increased filling pressure (grade 2 diastolic dysfunction).  - Left atrium: The atrium was mildly dilated.  - Right ventricle: The cavity size was mildly dilated. Wall   thickness was normal.    EKG:  EKG is ordered today.  The EKG ordered today demonstrates NSR, 88 bpm, no acute ST-T changes  Recent Labs: No results found for requested labs within last 8760 hours.  Recent Lipid Panel    Component Value Date/Time   CHOL 181 07/03/2014 0000   TRIG 152 07/03/2014 0000   HDL 52 07/03/2014 0000   LDLCALC 99 07/03/2014 0000    PHYSICAL EXAM:    VS:  BP 122/84 (BP Location: Left Arm, Patient Position: Sitting, Cuff Size: Large)   Pulse  88   Ht 5' 7.5" (1.715 m)   Wt 272 lb 6 oz (123.5 kg)   SpO2 98%   BMI 42.03 kg/m   BMI: Body mass index is 42.03 kg/m.  Physical Exam Vitals reviewed.  Constitutional:      Appearance: She is well-developed.  HENT:     Head: Normocephalic and atraumatic.  Eyes:     General:        Right eye: No discharge.        Left eye: No discharge.  Neck:     Vascular: No JVD.  Cardiovascular:     Rate and Rhythm: Normal rate and regular rhythm.     Pulses: No midsystolic click and no opening snap.          Posterior tibial pulses are 2+ on the right side and 2+ on the left side.     Heart sounds: Normal heart sounds, S1 normal and S2 normal. Heart sounds not distant. No murmur heard. No friction rub.  Pulmonary:     Effort: Pulmonary effort is normal. No respiratory distress.     Breath sounds: Normal breath sounds. No decreased breath sounds, wheezing or rales.  Chest:     Chest wall: No tenderness.  Abdominal:     General: There is no distension.     Palpations: Abdomen is soft.     Tenderness: There is no abdominal tenderness.  Musculoskeletal:     Cervical back: Normal range of motion.  Skin:    General: Skin is warm and dry.     Nails: There is no clubbing.  Neurological:     Mental Status: She is alert and oriented to person, place, and time.  Psychiatric:        Speech: Speech normal.        Behavior: Behavior normal.        Thought Content: Thought content normal.        Judgment: Judgment  normal.     Wt Readings from Last 3 Encounters:  06/10/20 272 lb 6 oz (123.5 kg)  12/21/18 268 lb (121.6 kg)  11/23/18 268 lb (121.6 kg)     ASSESSMENT & PLAN:   1. HFpEF: Volume status is difficult to assess on physical exam secondary to body habitus though she does appear euvolemic and well compensated.  She remains on torsemide 60 mg daily with KCl repletion with recent labs showing normal renal function and potassium at goal.  Optimal blood pressure control recommended.  CHF education.  2. Lower extremity edema: Likely multifactorial including morbid obesity with dependent edema, venous insufficiency, and lymphedema.  Continue lymphedema compression pumps as directed by outside office.  Recommend leg elevation.  She remains on torsemide with stable labs as outlined above.  3. Morbid obesity: Weight loss is advised  4. Sleep apnea: She has been less adherent to BiPAP since she was last seen, indicating she will use it 4 to 5 hours a couple nights per week.  Recommend compliance with CPAP.  Precautions discussed.  5. HTN: Blood pressure is well controlled in the office today.  Continue current medical therapy and low-sodium diet.  Disposition: F/u with Dr. Rockey Situ or an APP in 12 months, sooner if needed.   Medication Adjustments/Labs and Tests Ordered: Current medicines are reviewed at length with the patient today.  Concerns regarding medicines are outlined above. Medication changes, Labs and Tests ordered today are summarized above and listed in the Patient Instructions accessible in Encounters.   Signed, Thurmond Butts  Georgi Tuel, PA-C 06/10/2020 4:06 PM     [address] [place], [place] [address] [phone]

## 2020-06-10 ENCOUNTER — Encounter: Payer: Self-pay | Admitting: Physician Assistant

## 2020-06-10 ENCOUNTER — Other Ambulatory Visit: Payer: Self-pay

## 2020-06-10 ENCOUNTER — Ambulatory Visit (INDEPENDENT_AMBULATORY_CARE_PROVIDER_SITE_OTHER): Payer: Medicare Other | Admitting: Physician Assistant

## 2020-06-10 VITALS — BP 122/84 | HR 88 | Ht 67.5 in | Wt 272.4 lb

## 2020-06-10 DIAGNOSIS — M7989 Other specified soft tissue disorders: Secondary | ICD-10-CM

## 2020-06-10 DIAGNOSIS — G4733 Obstructive sleep apnea (adult) (pediatric): Secondary | ICD-10-CM

## 2020-06-10 DIAGNOSIS — I10 Essential (primary) hypertension: Secondary | ICD-10-CM | POA: Diagnosis not present

## 2020-06-10 DIAGNOSIS — I5032 Chronic diastolic (congestive) heart failure: Secondary | ICD-10-CM | POA: Diagnosis not present

## 2020-06-10 MED ORDER — CLONIDINE 0.1 MG/24HR TD PTWK: 0.1000 mg | MEDICATED_PATCH | TRANSDERMAL | 3 refills | Status: DC

## 2020-06-10 MED ORDER — TORSEMIDE 20 MG PO TABS: 60.0000 mg | ORAL_TABLET | Freq: Every day | ORAL | 3 refills | Status: DC

## 2020-06-10 MED ORDER — AMLODIPINE BESYLATE 5 MG PO TABS: ORAL_TABLET | ORAL | 3 refills | Status: DC

## 2020-06-10 MED ORDER — POTASSIUM CHLORIDE CRYS ER 20 MEQ PO TBCR: EXTENDED_RELEASE_TABLET | ORAL | 3 refills | Status: DC

## 2020-06-10 MED ORDER — NADOLOL 40 MG PO TABS: 40.0000 mg | ORAL_TABLET | Freq: Every day | ORAL | 3 refills | Status: DC

## 2020-06-10 MED ORDER — VALSARTAN-HYDROCHLOROTHIAZIDE 320-25 MG PO TABS: 1.0000 | ORAL_TABLET | Freq: Every day | ORAL | 3 refills | Status: DC

## 2020-06-10 NOTE — Patient Instructions (Signed)
Medication Instructions:  Refills sent in for your cardiac medications.  *If you need a refill on your cardiac medications before your next appointment, please call your pharmacy*   Lab Work: None  If you have labs (blood work) drawn today and your tests are completely normal, you will receive your results only by: Marland Kitchen MyChart Message (if you have MyChart) OR . A paper copy in the mail If you have any lab test that is abnormal or we need to change your treatment, we will call you to review the results.   Testing/Procedures: None   Follow-Up: At [facility], you and your health needs are our priority.  As part of our continuing mission to provide you with exceptional heart care, we have created designated Provider Care Teams.  These Care Teams include your primary Cardiologist (physician) and Advanced Practice Providers (APPs -  Physician Assistants and Nurse Practitioners) who all work together to provide you with the care you need, when you need it.  We recommend signing up for the patient portal called "MyChart".  Sign up information is provided on this After Visit Summary.  MyChart is used to connect with patients for Virtual Visits (Telemedicine).  Patients are able to view lab/test results, encounter notes, upcoming appointments, etc.  Non-urgent messages can be sent to your provider as well.   To learn more about what you can do with MyChart, go to [email or web address].    Your next appointment:   1 year(s)  The format for your next appointment:   In Person  Provider:   Ida Rogue, MD

## 2020-06-11 ENCOUNTER — Telehealth: Payer: Self-pay

## 2020-06-11 NOTE — Telephone Encounter (Signed)
Per fax received from [facility], nadolol requires prior authorization. PA initiated in [email or web address] and submitted to [employer] D for determination. ID: [id number] BIN: [id number] PCN: [id number] Group: [id number]  [phone]    May be denied due to requirement by insurance plan for patient to try/fail formulary alternatives: atenolol, carvedilol, metoprolol tartrate, metoprolol succinate ER, and bisoprolol. Patient started seeing Dr. Rockey Situ in 2017 at which time his office notes assessment and plan states:  Blood pressure is well controlled on today's visit. No changes made to the medications. No medication changes made at this time. I'm not opposed to nadolol. Uncertain if she would have dramatic improvement by changing to carvedilol or metoprolol succinate.  Already on an ARB

## 2020-06-12 NOTE — Telephone Encounter (Signed)
Patient returning call.

## 2020-06-12 NOTE — Telephone Encounter (Signed)
Fax received from [facility] stating prior authorization for nadolol has been denied. Patient would need to try/fail covered alternatives first: Acebutolol, atenolol, bisoprolol, bystolic/nebivolol (qty limits apply), carvedilol, labetolol, metoprolol succinate ER, metoprolol tartrate (25mg , 50mg , or 100mg  tablets) pindolol, propranolol, sorine, or timolol  OR show documentation that formulary drugs could cause adverse effects or be less effective in treating patient's condition.  Spoke with pharmacist at [facility]. They do not accept Good Rx coupons but he states their cash price is: $30.42 for 30 day supply.  [facility]'s price is $15.12 for 30 day supply using Good Rx.   Spoke with patient who states she will not switch pharmacies to get nadolol cheaper because she has been going to [facility] for 30 years and she does not want to change to a different medication. Her previous copay for nadolol was $3.   Please advise.

## 2020-06-16 NOTE — Telephone Encounter (Signed)
If patient does not want to change pharmacies to continue nadolol for a cheaper price she can change to carvedilol 6.25 mg twice daily (this dose is equivalent to her prior nadolol dose of 40 mg).

## 2020-06-16 NOTE — Telephone Encounter (Signed)
Up to the patient 90 days is $27 for nadolol at [facility] Or change to coreg 12.5 BID Will also cc ryan who last saw her. I have not seen her in 3 years

## 2020-06-17 MED ORDER — CARVEDILOL 6.25 MG PO TABS: 6.2500 mg | ORAL_TABLET | Freq: Two times a day (BID) | ORAL | 3 refills | Status: DC

## 2020-06-17 NOTE — Telephone Encounter (Signed)
Spoke with patient and reviewed options for medication. She was agreeable with change in medication and send in the carvedilol to her pharmacy. She verbalized understanding of medication change and confirmed which pharmacy to send updated medication. She verbalized understanding of our conversation, agreement with plan, and had no further questions at this time.

## 2020-06-17 NOTE — Addendum Note (Signed)
Addended by: Valora Corporal on: 06/17/2020 09:14 AM   Modules accepted: Orders

## 2020-09-29 ENCOUNTER — Inpatient Hospital Stay
Admission: EM | Admit: 2020-09-29 | Discharge: 2020-10-03 | DRG: 872 | Disposition: A | Discharge status: Home / Self Care | Payer: Medicare Other | Attending: Internal Medicine | Admitting: Internal Medicine

## 2020-09-29 ENCOUNTER — Emergency Department: Payer: Medicare Other

## 2020-09-29 ENCOUNTER — Encounter: Payer: Self-pay | Admitting: Emergency Medicine

## 2020-09-29 ENCOUNTER — Other Ambulatory Visit: Payer: Self-pay

## 2020-09-29 DIAGNOSIS — Z806 Family history of leukemia: Secondary | ICD-10-CM

## 2020-09-29 DIAGNOSIS — Z9109 Other allergy status, other than to drugs and biological substances: Secondary | ICD-10-CM

## 2020-09-29 DIAGNOSIS — Z881 Allergy status to other antibiotic agents status: Secondary | ICD-10-CM

## 2020-09-29 DIAGNOSIS — G8929 Other chronic pain: Secondary | ICD-10-CM | POA: Diagnosis present

## 2020-09-29 DIAGNOSIS — F325 Major depressive disorder, single episode, in full remission: Secondary | ICD-10-CM | POA: Diagnosis not present

## 2020-09-29 DIAGNOSIS — Z9989 Dependence on other enabling machines and devices: Secondary | ICD-10-CM

## 2020-09-29 DIAGNOSIS — I5030 Unspecified diastolic (congestive) heart failure: Secondary | ICD-10-CM

## 2020-09-29 DIAGNOSIS — E785 Hyperlipidemia, unspecified: Secondary | ICD-10-CM | POA: Diagnosis not present

## 2020-09-29 DIAGNOSIS — K219 Gastro-esophageal reflux disease without esophagitis: Secondary | ICD-10-CM | POA: Diagnosis present

## 2020-09-29 DIAGNOSIS — L03211 Cellulitis of face: Secondary | ICD-10-CM | POA: Diagnosis present

## 2020-09-29 DIAGNOSIS — M25562 Pain in left knee: Secondary | ICD-10-CM | POA: Diagnosis present

## 2020-09-29 DIAGNOSIS — Z20822 Contact with and (suspected) exposure to covid-19: Secondary | ICD-10-CM | POA: Diagnosis present

## 2020-09-29 DIAGNOSIS — Z888 Allergy status to other drugs, medicaments and biological substances status: Secondary | ICD-10-CM

## 2020-09-29 DIAGNOSIS — G4700 Insomnia, unspecified: Secondary | ICD-10-CM | POA: Diagnosis present

## 2020-09-29 DIAGNOSIS — Z85819 Personal history of malignant neoplasm of unspecified site of lip, oral cavity, and pharynx: Secondary | ICD-10-CM

## 2020-09-29 DIAGNOSIS — L03221 Cellulitis of neck: Principal | ICD-10-CM | POA: Diagnosis present

## 2020-09-29 DIAGNOSIS — R000 Tachycardia, unspecified: Secondary | ICD-10-CM | POA: Diagnosis present

## 2020-09-29 DIAGNOSIS — Z880 Allergy status to penicillin: Secondary | ICD-10-CM

## 2020-09-29 DIAGNOSIS — Z90710 Acquired absence of both cervix and uterus: Secondary | ICD-10-CM

## 2020-09-29 DIAGNOSIS — F411 Generalized anxiety disorder: Secondary | ICD-10-CM | POA: Diagnosis not present

## 2020-09-29 DIAGNOSIS — Z8541 Personal history of malignant neoplasm of cervix uteri: Secondary | ICD-10-CM

## 2020-09-29 DIAGNOSIS — A419 Sepsis, unspecified organism: Secondary | ICD-10-CM | POA: Diagnosis present

## 2020-09-29 DIAGNOSIS — E876 Hypokalemia: Secondary | ICD-10-CM | POA: Diagnosis present

## 2020-09-29 DIAGNOSIS — Z8249 Family history of ischemic heart disease and other diseases of the circulatory system: Secondary | ICD-10-CM

## 2020-09-29 DIAGNOSIS — Z79899 Other long term (current) drug therapy: Secondary | ICD-10-CM

## 2020-09-29 DIAGNOSIS — G4733 Obstructive sleep apnea (adult) (pediatric): Secondary | ICD-10-CM | POA: Diagnosis present

## 2020-09-29 DIAGNOSIS — I5032 Chronic diastolic (congestive) heart failure: Secondary | ICD-10-CM | POA: Diagnosis present

## 2020-09-29 DIAGNOSIS — Z91013 Allergy to seafood: Secondary | ICD-10-CM

## 2020-09-29 DIAGNOSIS — I10 Essential (primary) hypertension: Secondary | ICD-10-CM | POA: Diagnosis present

## 2020-09-29 DIAGNOSIS — Z6841 Body Mass Index (BMI) 40.0 and over, adult: Secondary | ICD-10-CM

## 2020-09-29 DIAGNOSIS — Z91041 Radiographic dye allergy status: Secondary | ICD-10-CM

## 2020-09-29 DIAGNOSIS — F5101 Primary insomnia: Secondary | ICD-10-CM

## 2020-09-29 DIAGNOSIS — Z833 Family history of diabetes mellitus: Secondary | ICD-10-CM

## 2020-09-29 DIAGNOSIS — G894 Chronic pain syndrome: Secondary | ICD-10-CM | POA: Diagnosis present

## 2020-09-29 DIAGNOSIS — Z8543 Personal history of malignant neoplasm of ovary: Secondary | ICD-10-CM

## 2020-09-29 DIAGNOSIS — L0390 Cellulitis, unspecified: Secondary | ICD-10-CM | POA: Diagnosis present

## 2020-09-29 DIAGNOSIS — Z823 Family history of stroke: Secondary | ICD-10-CM

## 2020-09-29 DIAGNOSIS — Z825 Family history of asthma and other chronic lower respiratory diseases: Secondary | ICD-10-CM

## 2020-09-29 DIAGNOSIS — Z803 Family history of malignant neoplasm of breast: Secondary | ICD-10-CM

## 2020-09-29 DIAGNOSIS — R490 Dysphonia: Secondary | ICD-10-CM | POA: Diagnosis present

## 2020-09-29 DIAGNOSIS — J391 Other abscess of pharynx: Secondary | ICD-10-CM | POA: Diagnosis present

## 2020-09-29 DIAGNOSIS — Z8042 Family history of malignant neoplasm of prostate: Secondary | ICD-10-CM

## 2020-09-29 DIAGNOSIS — Z7951 Long term (current) use of inhaled steroids: Secondary | ICD-10-CM

## 2020-09-29 DIAGNOSIS — I110 Hypertensive heart disease with heart failure: Secondary | ICD-10-CM | POA: Diagnosis present

## 2020-09-29 DIAGNOSIS — Z882 Allergy status to sulfonamides status: Secondary | ICD-10-CM

## 2020-09-29 DIAGNOSIS — J4540 Moderate persistent asthma, uncomplicated: Secondary | ICD-10-CM | POA: Diagnosis present

## 2020-09-29 DIAGNOSIS — Z91040 Latex allergy status: Secondary | ICD-10-CM

## 2020-09-29 LAB — CBC WITH DIFFERENTIAL/PLATELET
Abs Immature Granulocytes: 0.07 10*3/uL (ref 0.00–0.07)
Basophils Absolute: 0.1 10*3/uL (ref 0.0–0.1)
Basophils Relative: 0 %
Eosinophils Absolute: 0.3 10*3/uL (ref 0.0–0.5)
Eosinophils Relative: 2 %
HCT: 43.6 % (ref 36.0–46.0)
Hemoglobin: 14.5 g/dL (ref 12.0–15.0)
Immature Granulocytes: 0 %
Lymphocytes Relative: 15 %
Lymphs Abs: 2.5 10*3/uL (ref 0.7–4.0)
MCH: 31 pg (ref 26.0–34.0)
MCHC: 33.3 g/dL (ref 30.0–36.0)
MCV: 93.2 fL (ref 80.0–100.0)
Monocytes Absolute: 1.2 10*3/uL — ABNORMAL HIGH (ref 0.1–1.0)
Monocytes Relative: 8 %
Neutro Abs: 12.1 10*3/uL — ABNORMAL HIGH (ref 1.7–7.7)
Neutrophils Relative %: 75 %
Platelets: 273 10*3/uL (ref 150–400)
RBC: 4.68 MIL/uL (ref 3.87–5.11)
RDW: 12.8 % (ref 11.5–15.5)
WBC: 16.3 10*3/uL — ABNORMAL HIGH (ref 4.0–10.5)
nRBC: 0 % (ref 0.0–0.2)

## 2020-09-29 LAB — COMPREHENSIVE METABOLIC PANEL
ALT: 25 U/L (ref 0–44)
AST: 32 U/L (ref 15–41)
Albumin: 4.4 g/dL (ref 3.5–5.0)
Alkaline Phosphatase: 78 U/L (ref 38–126)
Anion gap: 12 (ref 5–15)
BUN: 12 mg/dL (ref 6–20)
CO2: 26 mmol/L (ref 22–32)
Calcium: 9.4 mg/dL (ref 8.9–10.3)
Chloride: 98 mmol/L (ref 98–111)
Creatinine, Ser: 0.62 mg/dL (ref 0.44–1.00)
GFR, Estimated: >60 mL/min (ref >60)
Glucose, Bld: 104 mg/dL — ABNORMAL HIGH (ref 70–99)
Potassium: 3.9 mmol/L (ref 3.5–5.1)
Sodium: 136 mmol/L (ref 135–145)
Total Bilirubin: 0.7 mg/dL (ref 0.3–1.2)
Total Protein: 8.2 g/dL — ABNORMAL HIGH (ref 6.5–8.1)

## 2020-09-29 LAB — BRAIN NATRIURETIC PEPTIDE: B Natriuretic Peptide: 20.8 pg/mL (ref 0.0–100.0)

## 2020-09-29 LAB — RESP PANEL BY RT-PCR (FLU A&B, COVID) ARPGX2
Influenza A by PCR: NEGATIVE
Influenza B by PCR: NEGATIVE
SARS Coronavirus 2 by RT PCR: NEGATIVE

## 2020-09-29 LAB — SEDIMENTATION RATE: Sed Rate: 15 mm/hr (ref 0–30)

## 2020-09-29 LAB — LACTIC ACID, PLASMA
Lactic Acid, Venous: 2 mmol/L (ref 0.5–1.9)
Lactic Acid, Venous: 1.7 mmol/L (ref 0.5–1.9)

## 2020-09-29 LAB — GROUP A STREP BY PCR: Group A Strep by PCR: NOT DETECTED

## 2020-09-29 MED ORDER — ONDANSETRON HCL 4 MG/2ML IJ SOLN
4.0000 mg | Freq: Once | INTRAMUSCULAR | Status: AC
  Administered 2020-09-29: 4 mg via INTRAVENOUS
  Filled 2020-09-29: qty 2

## 2020-09-29 MED ORDER — MORPHINE SULFATE (PF) 4 MG/ML IV SOLN
4.0000 mg | Freq: Once | INTRAVENOUS | Status: AC
  Administered 2020-09-29: 4 mg via INTRAVENOUS
  Filled 2020-09-29: qty 1

## 2020-09-29 MED ORDER — ACETAMINOPHEN 325 MG PO TABS
650.0000 mg | ORAL_TABLET | Freq: Four times a day (QID) | ORAL | Status: DC | PRN
  Administered 2020-09-29 – 2020-09-30 (×4): 650 mg via ORAL
  Filled 2020-09-29 (×4): qty 2

## 2020-09-29 MED ORDER — LACTATED RINGERS IV BOLUS (SEPSIS)
1000.0000 mL | Freq: Once | INTRAVENOUS | Status: AC
  Administered 2020-09-29: 1000 mL via INTRAVENOUS

## 2020-09-29 MED ORDER — CLONIDINE HCL 0.1 MG/24HR TD PTWK
0.1000 mg | MEDICATED_PATCH | TRANSDERMAL | Status: DC
  Filled 2020-09-29: qty 1

## 2020-09-29 MED ORDER — ESCITALOPRAM OXALATE 10 MG PO TABS
10.0000 mg | ORAL_TABLET | Freq: Every day | ORAL | Status: DC
  Administered 2020-09-29 – 2020-10-03 (×5): 10 mg via ORAL
  Filled 2020-09-29 (×5): qty 1

## 2020-09-29 MED ORDER — ONDANSETRON HCL 4 MG PO TABS: 4.0000 mg | ORAL_TABLET | Freq: Four times a day (QID) | ORAL | Status: DC | PRN

## 2020-09-29 MED ORDER — ENOXAPARIN SODIUM 60 MG/0.6ML IJ SOSY
60.0000 mg | PREFILLED_SYRINGE | INTRAMUSCULAR | Status: DC
  Administered 2020-09-29 – 2020-10-02 (×4): 60 mg via SUBCUTANEOUS
  Filled 2020-09-29 (×4): qty 0.6

## 2020-09-29 MED ORDER — METRONIDAZOLE 500 MG/100ML IV SOLN
500.0000 mg | Freq: Three times a day (TID) | INTRAVENOUS | Status: DC
  Administered 2020-09-29 – 2020-10-01 (×6): 500 mg via INTRAVENOUS
  Filled 2020-09-29 (×8): qty 100

## 2020-09-29 MED ORDER — VANCOMYCIN HCL IN DEXTROSE 1-5 GM/200ML-% IV SOLN
1000.0000 mg | Freq: Two times a day (BID) | INTRAVENOUS | Status: DC
  Administered 2020-09-29 – 2020-10-01 (×4): 1000 mg via INTRAVENOUS
  Filled 2020-09-29 (×6): qty 200

## 2020-09-29 MED ORDER — DIAZEPAM 5 MG PO TABS
5.0000 mg | ORAL_TABLET | Freq: Two times a day (BID) | ORAL | Status: DC | PRN
  Administered 2020-09-30: 5 mg via ORAL
  Filled 2020-09-29: qty 1

## 2020-09-29 MED ORDER — LACTATED RINGERS IV BOLUS (SEPSIS)
1000.0000 mL | Freq: Once | INTRAVENOUS | Status: AC
  Administered 2020-09-29: 16:00:00 1000 mL via INTRAVENOUS

## 2020-09-29 MED ORDER — ONDANSETRON HCL 4 MG/2ML IJ SOLN
4.0000 mg | Freq: Four times a day (QID) | INTRAMUSCULAR | Status: DC | PRN
  Administered 2020-09-30: 09:00:00 4 mg via INTRAVENOUS
  Filled 2020-09-29: qty 2

## 2020-09-29 MED ORDER — ZOLPIDEM TARTRATE 5 MG PO TABS: 5.0000 mg | ORAL_TABLET | Freq: Every evening | ORAL | Status: DC | PRN

## 2020-09-29 MED ORDER — CARVEDILOL 6.25 MG PO TABS
6.2500 mg | ORAL_TABLET | Freq: Two times a day (BID) | ORAL | Status: DC
  Administered 2020-09-29 – 2020-10-03 (×8): 6.25 mg via ORAL
  Filled 2020-09-29 (×8): qty 1

## 2020-09-29 MED ORDER — ALBUTEROL SULFATE (2.5 MG/3ML) 0.083% IN NEBU: 2.5000 mg | INHALATION_SOLUTION | Freq: Four times a day (QID) | RESPIRATORY_TRACT | Status: DC | PRN

## 2020-09-29 MED ORDER — MORPHINE SULFATE (PF) 2 MG/ML IV SOLN
2.0000 mg | INTRAVENOUS | Status: DC | PRN
  Administered 2020-09-29 – 2020-09-30 (×4): 2 mg via INTRAVENOUS
  Filled 2020-09-29 (×5): qty 1

## 2020-09-29 MED ORDER — DEXAMETHASONE SODIUM PHOSPHATE 10 MG/ML IJ SOLN
10.0000 mg | Freq: Once | INTRAMUSCULAR | Status: AC
  Administered 2020-09-29: 10 mg via INTRAVENOUS
  Filled 2020-09-29: qty 1

## 2020-09-29 MED ORDER — LACTATED RINGERS IV SOLN: INTRAVENOUS | Status: DC

## 2020-09-29 MED ORDER — VANCOMYCIN HCL 1500 MG/300ML IV SOLN
1500.0000 mg | Freq: Once | INTRAVENOUS | Status: AC
  Administered 2020-09-29: 1500 mg via INTRAVENOUS
  Filled 2020-09-29: qty 300

## 2020-09-29 MED ORDER — SODIUM CHLORIDE 0.9 % IV SOLN
1.0000 g | Freq: Once | INTRAVENOUS | Status: AC
  Administered 2020-09-29: 1 g via INTRAVENOUS
  Filled 2020-09-29: qty 10

## 2020-09-29 MED ORDER — ACETAMINOPHEN 650 MG RE SUPP: 650.0000 mg | Freq: Four times a day (QID) | RECTAL | Status: DC | PRN

## 2020-09-29 MED ORDER — HYDRALAZINE HCL 20 MG/ML IJ SOLN: 5.0000 mg | Freq: Four times a day (QID) | INTRAMUSCULAR | Status: DC | PRN

## 2020-09-29 MED ORDER — PANTOPRAZOLE SODIUM 40 MG PO TBEC
80.0000 mg | DELAYED_RELEASE_TABLET | Freq: Every morning | ORAL | Status: DC
  Administered 2020-09-30 – 2020-10-03 (×4): 80 mg via ORAL
  Filled 2020-09-29 (×4): qty 2

## 2020-09-29 MED ORDER — IPRATROPIUM-ALBUTEROL 0.5-2.5 (3) MG/3ML IN SOLN
3.0000 mL | Freq: Two times a day (BID) | RESPIRATORY_TRACT | Status: DC
  Administered 2020-09-29 – 2020-09-30 (×2): 3 mL via RESPIRATORY_TRACT
  Filled 2020-09-29 (×2): qty 3

## 2020-09-29 MED ORDER — OXYBUTYNIN CHLORIDE ER 5 MG PO TB24
15.0000 mg | ORAL_TABLET | Freq: Every day | ORAL | Status: DC
  Administered 2020-09-29 – 2020-10-03 (×5): 15 mg via ORAL
  Filled 2020-09-29 (×5): qty 1

## 2020-09-29 NOTE — H&P (Signed)
History and Physical   Jacqueline Campbell HQI:[id number] DOB: 1961-01-10 DOA: 09/29/2020  PCP: Kirk Ruths, MD  Outpatient Specialists: Dr. Rockey Situ, cardiology Patient coming from: home via [place]  I have personally briefly reviewed patient's old medical records in [place].  Chief Concern: Shortness of breath and sore throat  HPI: Jacqueline Campbell is a 60 y.o. female with medical history significant for hypertension, heart failure preserved ejection fraction, obesity, OSA, insomnia, depression/anxiety, presents emergency department via home vehicle for chief concerns of shortness of breath and sore throat.  She states the sore throat and difficulty swallowing and breathing started about two days ago and it gradually worsened.   The nausea and vomiting started 09/28/20 and this morning she woke up her younger brother and told him to the ED.She endorses subjective fever at home with T max of 100.   She denies chest pain, dysuria, hematuria, diarrhea, blood in stool, melena, passing out, lost of consciousness. She denies changes to diet and/or medications.   She reports that this happened when she was 60 years old and require tonsilectomy and tumor removal.  Patient does not know whether the tumor was malignant.  Social history: She lives with daughter and her brother. She denies tobacco, etoh, recreatioanl drugs. She disabled and formerly worked in [employer]  Vaccination history: She is vacinated for covid 19, two doses  ROS: Constitutional: no weight change, no fever ENT/Mouth: no sore throat, no rhinorrhea Eyes: no eye pain, no vision changes Cardiovascular: no chest pain, no dyspnea,  no edema, no palpitations Respiratory: no cough, no sputum, no wheezing Gastrointestinal: no nausea, no vomiting, no diarrhea, no constipation Genitourinary: no urinary incontinence, no dysuria, no hematuria Musculoskeletal: no arthralgias, no myalgias Skin: no skin lesions, no pruritus, Neuro: +  weakness, no loss of consciousness, no syncope Psych: no anxiety, no depression, + decrease appetite Heme/Lymph: no bruising, no bleeding  ED Course: Discussed with EDP, patient requiring hospitalization for meeting sepsis criteria.  Vitals in the emergency department was remarkable for temperature of 98.4, respiration rate of 20, heart rate 101, blood pressure 157/101, SPO2 of 97% on room air.  Labs in the emergency department was remarkable for WBC 16.3, hemoglobin 14.5, platelets 273, BUN 12, serum creatinine of 0.62, nonfasting blood glucose 104.  Lactic acid 2.0.  COVID PCR/influenza A/influenza B PCR were negative.  Strep a was negative.  In the emergency department, patient received Decadron 10 mg IV, morphine 4 mg IV, Zofran 4 mg IV, ceftriaxone IV, vancomycin IV, lactated Ringer 2 L bolus, lactated Ringer 150 mL/h.  Assessment/Plan  Active Problems:   Moderate persistent asthma   OSA on CPAP   Morbid obesity with BMI of 40.0-44.9, adult (HCC)   Insomnia   Hypertension, benign   Dyslipidemia   GERD (gastroesophageal reflux disease)   GAD (generalized anxiety disorder)   Chronic pain of left knee   OSA treated with BiPAP   Morbid obesity (HCC)   Depression, major, in remission ([place])   Sepsis ([place])   # Sore throat and throat swelling # Meet sepsis criteria with elevated heart rate, WBC, initial increase in lactic acid of 2.0 - Blood cultures x2 - Chest x-ray was unremarkable - CT soft tissue of neck showed scattered small lymph nodes widely distributed neck bilaterally, possible infection however lymphoma cannot be excluded - Status post Decadron 10 mg IV, morphine 4 mg IV, Zofran 4 mg IV, ceftriaxone IV, vancomycin IV per EDP - Status post 2 L lactated ringer  bolus, LR 150 mL/h - We will continue with vancomycin IV, and add metronidazole for anaerobic coverage - Morphine 2 mg IV every 4 hours for severe pain, 1 day ordered - No indication for Decadron and or steroids  at this time - Check C1 esterase, C3 complement, C4 complement - Holding ARB and amlodipine at this time  # Hypertension-resumed carvedilol 6.25 mg twice daily - Clonidine 0.1 mg transdermal patch every Monday resumed for 09/30/2020 - Hydralazine 5 mg IV every 6 hours as needed for SBP greater than 180 -I have not resumed valsartan-HCTZ at this time due to swelling in her mouth Hyperlipidemia  # GERD-PPI  # Generalized anxiety disorder-Valium 5 mg p.o. every 12 hours.  For anxiety, escitalopram 10 mg daily  # Insomnia-takes home Ambien 5 mg - I have resumed Ambien.  # OSA-CPAP nightly ordered  Chart reviewed.   DVT prophylaxis: Enoxaparin weight-based, subcutaneous, every 24 hours Code Status: full code  Diet: Liquid diet full, advance to soft as tolerated per nursing staff Family Communication: Updated brother at bedside Disposition Plan: Pending clinical course Consults called: None at this time Admission status: MedSurg, observation, telemetry  Past Medical History:  Diagnosis Date   Asthma    Bladder incontinence    Cancer ([place]) 1992   cervical   Chronic diastolic CHF (congestive heart failure) ([place])    a. echo 12/2014: EF 60-65%, no RWMA, GR2DD, RV mildly dilated   Chronic insomnia    Chronic interstitial cystitis    GERD (gastroesophageal reflux disease)    Glaucoma    Hypertension    Metabolic syndrome    Mood disorder ([place])    Nausea    Chronic   Obesity    Ovarian cancer ([place]) 1994   Plantar fasciitis    Throat cancer ([place]) 1998   Past Surgical History:  Procedure Laterality Date   ABDOMINAL HYSTERECTOMY  1992   BLADDER SURGERY     multiple   BREAST BIOPSY Right 2016   sankar bx, benign   EYE SURGERY Bilateral 2006, 2008, 2010   FOOT SURGERY Bilateral 1996   KNEE SURGERY Left    age 89   [place]   Social History:  reports that she has never smoked. She has never used smokeless tobacco. She  reports that she does not drink alcohol and does not use drugs.  Allergies  Allergen Reactions   Red Dye Anaphylaxis   Abilify [Aripiprazole]    Amitiza [Lubiprostone]    Benadryl [Diphenhydramine Hcl]    Doxycycline    Gabapentin    Iohexol    Lac Bovis Swelling   Risperidone And Related    Shellfish Allergy    Strawberry (Diagnostic) Swelling   Strawberry Extract Swelling   Aspirin Rash   Ciprofloxacin Rash   Enablex [Darifenacin] Rash   Iodine Rash   Latex Rash   Levofloxacin Rash   Penicillins Rash   Risperidone Rash   Sulfa Antibiotics Rash   Family History  Problem Relation Age of Onset   Breast cancer Mother 57       eye and ovarian   Hypertension Mother    Diabetes Mother    Heart disease Mother    COPD Mother    Stroke Mother    Cancer Father        Colon and Prostate   Heart disease Father    Leukemia Son    Prostate cancer Brother    Breast  cancer Sister 53   Family history: Family history reviewed and not pertinent  Prior to Admission medications   Medication Sig Start Date End Date Taking? Authorizing Provider  acyclovir ointment (ZOVIRAX) 5 % Apply 1 application topically 5 (five) times daily as needed. 11/13/15   Steele Sizer, MD  albuterol (PROVENTIL) (2.5 MG/3ML) 0.083% nebulizer solution Take 2.5 mg by nebulization every 6 (six) hours as needed for wheezing or shortness of breath.    [provider]  amLODipine (NORVASC) 5 MG tablet TAKE 1 TABLET BY MOUTH NIGTHLY 06/10/20   Rise Mu, PA-C  budesonide-formoterol (SYMBICORT) 80-4.5 MCG/ACT inhaler Inhale 2 puffs into the lungs 2 (two) times daily.    [provider]  carvedilol (COREG) 6.25 MG tablet Take 1 tablet (6.25 mg total) by mouth 2 (two) times daily. 06/17/20   Dunn, Areta Haber, PA-C  cloNIDine (CATAPRES - DOSED IN MG/24 HR) 0.1 mg/24hr patch Place 1 patch (0.1 mg total) onto the skin once a week. 11/13/15   Steele Sizer, MD  diazepam (VALIUM) 5 MG tablet Take 1 tablet (5  mg total) by mouth every 12 (twelve) hours as needed for anxiety. 11/13/15   Steele Sizer, MD  dorzolamide (TRUSOPT) 2 % ophthalmic solution Place 1 drop into both eyes 3 (three) times daily.    [provider]  EPINEPHRINE IJ Inject as directed as needed. Epi pen    [provider]  escitalopram (LEXAPRO) 10 MG tablet Take by mouth.    [provider]  esomeprazole (NEXIUM) 40 MG capsule Take 1 capsule (40 mg total) by mouth daily before breakfast. 11/13/15   Ancil Boozer, Drue Stager, MD  Fluticasone-Salmeterol (ADVAIR) 500-50 MCG/DOSE AEPB Inhale 1 puff into the lungs every 12 (twelve) hours.    [provider]  ipratropium-albuterol (DUONEB) 0.5-2.5 (3) MG/3ML SOLN Take 3 mLs by nebulization 2 (two) times daily.    [provider]  linaclotide (LINZESS) 145 MCG CAPS capsule Take 1 capsule (145 mcg total) by mouth daily. 11/13/15   Steele Sizer, MD  loratadine (CLARITIN) 10 MG tablet TAKE ONE (1) TABLET EACH DAY 04/09/15   Steele Sizer, MD  meloxicam (MOBIC) 7.5 MG tablet Take 1 tablet (7.5 mg total) by mouth daily. 11/13/15   Steele Sizer, MD  metroNIDAZOLE (METROGEL) 0.75 % vaginal gel  11/08/18   [provider]  mometasone (NASONEX) 50 MCG/ACT nasal spray Place 2 sprays into the nose daily. 11/13/15   Steele Sizer, MD  montelukast (SINGULAIR) 10 MG tablet Take 1 tablet (10 mg total) by mouth at bedtime. 11/13/15   Steele Sizer, MD  oxybutynin (DITROPAN XL) 15 MG 24 hr tablet TAKE 1 TABLET BY MOUTH DAILY 04/05/19   Stoioff, Ronda Fairly, MD  oxyCODONE (ROXICODONE) 15 MG immediate release tablet Limit 1 tab by mouth 3 - 4  times per day if tolerated Patient taking differently: Limit 1 tab by mouth 3 - 4  times per day if tolerated 11/20/15   Mohammed Kindle, MD  potassium chloride SA (KLOR-CON) 20 MEQ tablet TAKE ONE (1) TABLET EACH DAY 06/10/20   Dunn, Areta Haber, PA-C  promethazine (PHENERGAN) 25 MG tablet Take 1 tablet (25 mg total) by mouth every 6  (six) hours as needed for nausea. 05/31/15   Steele Sizer, MD  tiZANidine (ZANAFLEX) 4 MG tablet Limit 1 tablet by mouth 2 -4  times per day if tolerated 11/20/15   Mohammed Kindle, MD  topiramate (TOPAMAX) 25 MG tablet Take 25 mg by mouth 2 (two) times  daily. 05/16/20   [provider]  torsemide (DEMADEX) 20 MG tablet Take 3 tablets (60 mg total) by mouth daily. 06/10/20 09/08/20  Rise Mu, PA-C  valACYclovir (VALTREX) 500 MG tablet Take 1 tablet (500 mg total) by mouth 1 day or 1 dose. 10/08/15   Steele Sizer, MD  valsartan-hydrochlorothiazide (DIOVAN-HCT) 320-25 MG tablet Take 1 tablet by mouth daily. 06/10/20   Dunn, Areta Haber, PA-C  zolpidem (AMBIEN) 5 MG tablet Take 1 tablet (5 mg total) by mouth at bedtime as needed for sleep. 07/25/15   Steele Sizer, MD   Physical Exam: Vitals:   09/29/20 0819 09/29/20 0820  BP: (!) 157/101   Pulse: (!) 101   Resp: 20   Temp: 98.4 F (36.9 C)   TempSrc: Oral   SpO2: 97%   Weight:  123.5 kg  Height:  5' 7.5" (1.715 m)   Constitutional: appears older than chronological age, NAD, calm, comfortable Eyes: PERRL, lids and conjunctivae normal ENMT: Mucous membranes are moist. Posterior pharynx clear of any exudate or lesions. Age-appropriate dentition. Hearing appropriate Neck: normal, supple, no masses, no thyromegaly.  Tenderness of her jaws bilaterally Respiratory: clear to auscultation bilaterally, no wheezing, no crackles. Normal respiratory effort. No accessory muscle use.  Cardiovascular: Regular rate and rhythm, no murmurs / rubs / gallops. No extremity edema. 2+ pedal pulses. No carotid bruits.  Abdomen: Obese abdomen, no tenderness, no masses palpated, no hepatosplenomegaly. Bowel sounds positive.  Musculoskeletal: no clubbing / cyanosis. No joint deformity upper and lower extremities. Good ROM, no contractures, no atrophy. Normal muscle tone.  Skin: no rashes, lesions, ulcers. No induration Neurologic: Sensation intact. Strength 5/5  in all 4.  Psychiatric: Normal judgment and insight. Alert and oriented x 3. Normal mood.   EKG: independently reviewed, showing sinus tachycardia with rate of 97, QTC 419  Chest x-ray on Admission: I personally reviewed and I agree with radiologist reading as below.  DG Chest 2 View  Result Date: 09/29/2020 CLINICAL DATA:  59 year old female with shortness of breath, sore throat, fever cough and congestion. EXAM: CHEST - 2 VIEW COMPARISON:  Chest radiograph 10/05/2016 and earlier. FINDINGS: Lung volumes and mediastinal contours remain normal. Visualized tracheal air column is within normal limits. No pneumothorax, pulmonary edema, pleural effusion or confluent pulmonary opacity. Mild chronic increased interstitial markings in both lungs appears stable or decreased since 2018. Degenerative endplate spurring in the spine. No acute osseous abnormality identified. Negative visible bowel gas pattern. IMPRESSION: No acute cardiopulmonary abnormality. Electronically Signed   By: Genevie Ann M.D.   On: 09/29/2020 08:56   CT Soft Tissue Neck Wo Contrast  Result Date: 09/29/2020 CLINICAL DATA:  Left neck and midline inflammatory changes. Rule out abscess or Ludwig's angina. EXAM: CT NECK WITHOUT CONTRAST TECHNIQUE: Multidetector CT imaging of the neck was performed following the standard protocol without intravenous contrast. COMPARISON:  None. FINDINGS: Pharynx and larynx: Normal. No mass or swelling. Negative for pharyngeal abscess. Salivary glands: No inflammation, mass, or stone. Thyroid: Negative Lymph nodes: Numerous small lymph nodes are present throughout the neck bilaterally. These are relatively symmetric. No pathologically enlarged lymph nodes are identified. Vascular: Limited vascular evaluation without intravenous contrast. Limited intracranial: Negative Visualized orbits: Negative Mastoids and visualized paranasal sinuses: Negative Skeleton: No acute skeletal abnormality. Upper chest: Lung apices clear  bilaterally. Other: None IMPRESSION: Negative for mass or acute inflammation in the neck. Negative for pharyngeal abscess Scattered small lymph nodes are widely distributed through the neck bilaterally. These may be reactive  nodes, particularly the patient has clinical symptoms of pharyngitis. Lymphoma not excluded. Close follow-up and biopsy recommended if clinically indicated. Correlate with lab values. Electronically Signed   By: Franchot Gallo M.D.   On: 09/29/2020 12:38    Labs on Admission: I have personally reviewed following labs  CBC: Recent Labs  Lab 09/29/20 1148  WBC 16.3*  NEUTROABS 12.1*  HGB 14.5  HCT 43.6  MCV 93.2  PLT 336   Basic Metabolic Panel: Recent Labs  Lab 09/29/20 1148  NA 136  K 3.9  CL 98  CO2 26  GLUCOSE 104*  BUN 12  CREATININE 0.62  CALCIUM 9.4   GFR: Estimated Creatinine Clearance: 105.4 mL/min (by C-G formula based on SCr of 0.62 mg/dL).  Liver Function Tests: Recent Labs  Lab 09/29/20 1148  AST 32  ALT 25  ALKPHOS 78  BILITOT 0.7  PROT 8.2*  ALBUMIN 4.4   Urine analysis:    Component Value Date/Time   COLORURINE YELLOW (A) 10/05/2016 1627   APPEARANCEUR Hazy (A) 12/21/2018 0829   LABSPEC 1.010 10/05/2016 1627   LABSPEC 1.011 10/12/2013 1054   PHURINE 7.0 10/05/2016 1627   GLUCOSEU Negative 12/21/2018 0829   GLUCOSEU Negative 10/12/2013 1054   HGBUR NEGATIVE 10/05/2016 1627   BILIRUBINUR Negative 12/21/2018 0829   BILIRUBINUR Negative 10/12/2013 1054   KETONESUR 5 (A) 10/05/2016 1627   PROTEINUR 1+ (A) 12/21/2018 0829   PROTEINUR 30 (A) 10/05/2016 1627   UROBILINOGEN negative 07/25/2015 1054   NITRITE Negative 12/21/2018 0829   NITRITE NEGATIVE 10/05/2016 1627   LEUKOCYTESUR Negative 12/21/2018 0829   LEUKOCYTESUR Negative 10/12/2013 1054   Dr. Tobie Poet [facility]  If 7PM-7AM, please contact overnight-coverage provider If 7AM-7PM, please contact day coverage provider [email or web address]  09/29/2020, 1:22 PM

## 2020-09-29 NOTE — Progress Notes (Signed)
PHARMACIST - PHYSICIAN COMMUNICATION  CONCERNING:  Enoxaparin (Lovenox) for DVT Prophylaxis    RECOMMENDATION: Patient was prescribed enoxaprin 40mg  q24 hours for VTE prophylaxis.   Filed Weights   09/29/20 0820  Weight: 123.5 kg (272 lb 4.3 oz)    Body mass index is 42.01 kg/m.  Estimated Creatinine Clearance: 105.4 mL/min (by C-G formula based on SCr of 0.62 mg/dL).   Based on [place] patient is candidate for enoxaparin 0.5mg /kg TBW SQ every 24 hours based on BMI being >30.  DESCRIPTION: Pharmacy has adjusted enoxaparin dose per [facility] policy.  Patient is now receiving enoxaparin 60 mg every 24 hours    Maryuri Warnke, PharmD Clinical Pharmacist  09/29/2020 1:27 PM
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
## 2020-09-29 NOTE — ED Notes (Signed)
MD hospitalist at bedside.

## 2020-09-29 NOTE — Progress Notes (Signed)
PHARMACY -  BRIEF ANTIBIOTIC NOTE   Pharmacy has received consult(s) for Vancomycin from an ED provider.  The patient's profile has been reviewed for ht/wt/allergies/indication/available labs.    One time order(s) placed for Vancomycin 1500mg  and Ceftriaxone 1g have been placed by ED provider.   Further antibiotics/pharmacy consults should be ordered by admitting physician if indicated.                       Thank you, Pernell Dupre, PharmD, BCPS Clinical Pharmacist 09/29/2020 11:51 AM

## 2020-09-29 NOTE — Progress Notes (Signed)
Patient verified that she does have a bipap she uses at home. Patient has however refused cpap while here. Patient c/o severe headache

## 2020-09-29 NOTE — Sepsis Progress Note (Signed)
[facility] is monitoring this code sepsis 

## 2020-09-29 NOTE — ED Notes (Signed)
Pt back from CT

## 2020-09-29 NOTE — Progress Notes (Signed)
Pharmacy Antibiotic Note  Jacqueline Campbell is a 60 y.o. female admitted on 09/29/2020 with sore throat, congestion, fever, and cough. Pharmacy has been consulted for vancomycin dosing for Sepsis. Patient is also receiving metronidazole IV every 8 hours.   Plan: Patient received vancomycin 1500mg  x 1 dose in ED.  Will start vancomycin 1000mg  IV every 12 hours. Since patient did not receive full loading dose, will start dose @ 2000 this evening.  Goal AUC 400-550. Expected AUC: 472 SCr used: 0.8   Height: 5' 7.5" (171.5 cm) Weight: 123.5 kg (272 lb 4.3 oz) IBW/kg (Calculated) : 62.75  Temp (24hrs), Avg:98.4 F (36.9 C), Min:98.4 F (36.9 C), Max:98.4 F (36.9 C)  Recent Labs  Lab 09/29/20 1148  WBC 16.3*  CREATININE 0.62  LATICACIDVEN 2.0*    Estimated Creatinine Clearance: 105.4 mL/min (by C-G formula based on SCr of 0.62 mg/dL).    Allergies  Allergen Reactions   Red Dye Anaphylaxis   Abilify [Aripiprazole]    Amitiza [Lubiprostone]    Benadryl [Diphenhydramine Hcl]    Doxycycline    Gabapentin    Iohexol    Lac Bovis Swelling   Risperidone And Related    Shellfish Allergy    Strawberry (Diagnostic) Swelling   Strawberry Extract Swelling   Aspirin Rash   Ciprofloxacin Rash   Enablex [Darifenacin] Rash   Iodine Rash   Latex Rash   Levofloxacin Rash   Penicillins Rash   Risperidone Rash   Sulfa Antibiotics Rash    Antimicrobials this admission: 7/17 ceftriaxone x 1 7/17 Metronidazole>> 7/17 Vancomycin>>    Microbiology results: 7/17 Bcx: pending   Thank you for allowing pharmacy to be a part of this patient's care.  Pernell Dupre, PharmD, BCPS Clinical Pharmacist 09/29/2020 1:39 PM

## 2020-09-29 NOTE — ED Triage Notes (Signed)
Pt comes into the ED via POV c/o SHOB and sore throat.  Pt states she has been having a sore throat, congestion, fever, and cough x 2 days.  Pt states it hurts to talk so she is talking in a whisper at this time.  PT presents with even and unlabored respirations.  RN asked patient to stick out tongues for throat to be evaluated, but patient not sticking tongue out due to stating it hurts.

## 2020-09-29 NOTE — ED Notes (Signed)
Transport requested

## 2020-09-29 NOTE — Progress Notes (Signed)
CODE SEPSIS - PHARMACY COMMUNICATION  **Broad Spectrum Antibiotics should be administered within 1 hour of Sepsis diagnosis**  Time Code Sepsis Called/Page Received: @ 1250  Antibiotics Ordered: Ceftriaxone and vancomycin   Time of 1st antibiotic administration: @ [place], PharmD, BCPS Clinical Pharmacist 09/29/2020 12:52 PM

## 2020-09-30 DIAGNOSIS — L03211 Cellulitis of face: Secondary | ICD-10-CM | POA: Diagnosis present

## 2020-09-30 DIAGNOSIS — L0390 Cellulitis, unspecified: Secondary | ICD-10-CM | POA: Diagnosis present

## 2020-09-30 DIAGNOSIS — Z20822 Contact with and (suspected) exposure to covid-19: Secondary | ICD-10-CM | POA: Diagnosis present

## 2020-09-30 DIAGNOSIS — Z91013 Allergy to seafood: Secondary | ICD-10-CM | POA: Diagnosis not present

## 2020-09-30 DIAGNOSIS — J391 Other abscess of pharynx: Secondary | ICD-10-CM | POA: Diagnosis present

## 2020-09-30 DIAGNOSIS — G894 Chronic pain syndrome: Secondary | ICD-10-CM | POA: Diagnosis present

## 2020-09-30 DIAGNOSIS — Z6841 Body Mass Index (BMI) 40.0 and over, adult: Secondary | ICD-10-CM | POA: Diagnosis not present

## 2020-09-30 DIAGNOSIS — G4733 Obstructive sleep apnea (adult) (pediatric): Secondary | ICD-10-CM | POA: Diagnosis present

## 2020-09-30 DIAGNOSIS — L03221 Cellulitis of neck: Secondary | ICD-10-CM | POA: Diagnosis present

## 2020-09-30 DIAGNOSIS — G4700 Insomnia, unspecified: Secondary | ICD-10-CM | POA: Diagnosis present

## 2020-09-30 DIAGNOSIS — I110 Hypertensive heart disease with heart failure: Secondary | ICD-10-CM | POA: Diagnosis present

## 2020-09-30 DIAGNOSIS — F411 Generalized anxiety disorder: Secondary | ICD-10-CM | POA: Diagnosis present

## 2020-09-30 DIAGNOSIS — Z880 Allergy status to penicillin: Secondary | ICD-10-CM | POA: Diagnosis not present

## 2020-09-30 DIAGNOSIS — Z91041 Radiographic dye allergy status: Secondary | ICD-10-CM | POA: Diagnosis not present

## 2020-09-30 DIAGNOSIS — Z91040 Latex allergy status: Secondary | ICD-10-CM | POA: Diagnosis not present

## 2020-09-30 DIAGNOSIS — K219 Gastro-esophageal reflux disease without esophagitis: Secondary | ICD-10-CM | POA: Diagnosis present

## 2020-09-30 DIAGNOSIS — J4540 Moderate persistent asthma, uncomplicated: Secondary | ICD-10-CM | POA: Diagnosis present

## 2020-09-30 DIAGNOSIS — I5032 Chronic diastolic (congestive) heart failure: Secondary | ICD-10-CM | POA: Diagnosis present

## 2020-09-30 DIAGNOSIS — M25562 Pain in left knee: Secondary | ICD-10-CM | POA: Diagnosis present

## 2020-09-30 DIAGNOSIS — F325 Major depressive disorder, single episode, in full remission: Secondary | ICD-10-CM | POA: Diagnosis present

## 2020-09-30 DIAGNOSIS — Z881 Allergy status to other antibiotic agents status: Secondary | ICD-10-CM | POA: Diagnosis not present

## 2020-09-30 DIAGNOSIS — A419 Sepsis, unspecified organism: Secondary | ICD-10-CM | POA: Diagnosis present

## 2020-09-30 DIAGNOSIS — E785 Hyperlipidemia, unspecified: Secondary | ICD-10-CM | POA: Diagnosis present

## 2020-09-30 DIAGNOSIS — E876 Hypokalemia: Secondary | ICD-10-CM | POA: Diagnosis present

## 2020-09-30 LAB — BASIC METABOLIC PANEL
Anion gap: 10 (ref 5–15)
BUN: 9 mg/dL (ref 6–20)
CO2: 24 mmol/L (ref 22–32)
Calcium: 9.3 mg/dL (ref 8.9–10.3)
Chloride: 104 mmol/L (ref 98–111)
Creatinine, Ser: 0.69 mg/dL (ref 0.44–1.00)
GFR, Estimated: >60 mL/min (ref >60)
Glucose, Bld: 129 mg/dL — ABNORMAL HIGH (ref 70–99)
Potassium: 3.3 mmol/L — ABNORMAL LOW (ref 3.5–5.1)
Sodium: 138 mmol/L (ref 135–145)

## 2020-09-30 LAB — HIV ANTIBODY (ROUTINE TESTING W REFLEX): HIV Screen 4th Generation wRfx: NONREACTIVE

## 2020-09-30 LAB — CBC
HCT: 39.8 % (ref 36.0–46.0)
Hemoglobin: 13.4 g/dL (ref 12.0–15.0)
MCH: 30.5 pg (ref 26.0–34.0)
MCHC: 33.7 g/dL (ref 30.0–36.0)
MCV: 90.7 fL (ref 80.0–100.0)
Platelets: 263 10*3/uL (ref 150–400)
RBC: 4.39 MIL/uL (ref 3.87–5.11)
RDW: 12.6 % (ref 11.5–15.5)
WBC: 17.5 10*3/uL — ABNORMAL HIGH (ref 4.0–10.5)
nRBC: 0 % (ref 0.0–0.2)

## 2020-09-30 LAB — PROCALCITONIN: Procalcitonin: <0.1 ng/mL

## 2020-09-30 LAB — CORTISOL-AM, BLOOD: Cortisol - AM: 0.8 ug/dL — ABNORMAL LOW (ref 6.7–22.6)

## 2020-09-30 LAB — PROTIME-INR
INR: 1.1 (ref 0.8–1.2)
Prothrombin Time: 14.1 seconds (ref 11.4–15.2)

## 2020-09-30 MED ORDER — COSYNTROPIN 0.25 MG IJ SOLR
0.2500 mg | Freq: Once | INTRAMUSCULAR | Status: AC
  Administered 2020-10-01: 07:00:00 0.25 mg via INTRAVENOUS
  Filled 2020-09-30: qty 0.25

## 2020-09-30 MED ORDER — MOMETASONE FURO-FORMOTEROL FUM 100-5 MCG/ACT IN AERO
2.0000 | INHALATION_SPRAY | Freq: Two times a day (BID) | RESPIRATORY_TRACT | Status: DC
  Administered 2020-09-30 – 2020-10-03 (×6): 2 via RESPIRATORY_TRACT
  Filled 2020-09-30: qty 8.8

## 2020-09-30 MED ORDER — TOPIRAMATE 25 MG PO TABS
25.0000 mg | ORAL_TABLET | Freq: Two times a day (BID) | ORAL | Status: DC
  Administered 2020-09-30 – 2020-10-03 (×7): 25 mg via ORAL
  Filled 2020-09-30 (×8): qty 1

## 2020-09-30 MED ORDER — IPRATROPIUM-ALBUTEROL 0.5-2.5 (3) MG/3ML IN SOLN
3.0000 mL | Freq: Two times a day (BID) | RESPIRATORY_TRACT | Status: DC
  Administered 2020-09-30 – 2020-10-03 (×6): 3 mL via RESPIRATORY_TRACT
  Filled 2020-09-30 (×6): qty 3

## 2020-09-30 MED ORDER — LIDOCAINE VISCOUS HCL 2 % MT SOLN
15.0000 mL | OROMUCOSAL | Status: DC | PRN
  Administered 2020-10-01 – 2020-10-02 (×3): 15 mL via OROMUCOSAL
  Filled 2020-09-30 (×5): qty 15

## 2020-09-30 MED ORDER — POTASSIUM CHLORIDE 10 MEQ/100ML IV SOLN
10.0000 meq | INTRAVENOUS | Status: DC
  Administered 2020-09-30 (×4): 10 meq via INTRAVENOUS
  Filled 2020-09-30 (×2): qty 100

## 2020-09-30 MED ORDER — TRAZODONE HCL 50 MG PO TABS
25.0000 mg | ORAL_TABLET | Freq: Every evening | ORAL | Status: DC | PRN
  Administered 2020-09-30 – 2020-10-02 (×3): 25 mg via ORAL
  Filled 2020-09-30 (×3): qty 1

## 2020-09-30 MED ORDER — OXYCODONE HCL 5 MG PO TABS
15.0000 mg | ORAL_TABLET | Freq: Four times a day (QID) | ORAL | Status: DC | PRN
  Administered 2020-09-30 – 2020-10-03 (×11): 15 mg via ORAL
  Filled 2020-09-30 (×11): qty 3

## 2020-09-30 MED ORDER — SODIUM CHLORIDE 0.9 % IV SOLN
INTRAVENOUS | Status: DC | PRN
  Administered 2020-09-30 – 2020-10-02 (×2): 1000 mL via INTRAVENOUS

## 2020-09-30 MED ORDER — SODIUM CHLORIDE 0.9 % IV SOLN
2.0000 g | INTRAVENOUS | Status: DC
  Administered 2020-09-30 – 2020-10-02 (×3): 2 g via INTRAVENOUS
  Filled 2020-09-30: qty 20
  Filled 2020-09-30 (×3): qty 2

## 2020-09-30 NOTE — Progress Notes (Signed)
Patient continues to refuse bipap/cpap

## 2020-09-30 NOTE — ACP (Advance Care Planning) (Signed)
PT has a AD as of 09/30/20  Point of contact: Rondel Jumbo [phone] (PT's brother)

## 2020-09-30 NOTE — Progress Notes (Signed)
   09/30/20 0900  Clinical Encounter Type  Visited With Patient  Visit Type Initial;Spiritual support;Social support  Referral From Nurse  Consult/Referral To [facility] did AD education with PT. The Chaplains also ministered with prayer and compassionate presence. PT stated she would fill out the AD and will go over it with her brother, who she will place as Health Power of Attorney.

## 2020-09-30 NOTE — Progress Notes (Signed)
[facility] Progress Note  Patient: Jacqueline Campbell    WNI:[id number]  DOA: 09/29/2020     Date of Service: the patient was seen and examined on 09/30/2020  Brief hospital course: Past medical history of tonsillectomy, HTN, HFpEF, obesity, OSA, depression, anxiety.  Presents with complaints of shortness of breath and sore throat.  Also reports muffled voice.  Ongoing for 2 weeks.  Progressively worsening. Appears to have cellulitis of the face as well as tender lymphadenopathy Currently plan is IV antibiotics, follow-up on ENT recommendation as well as monitor progression.  Subjective: Continues to report sore throat.  Continues to report pain around face.  No nausea no vomiting.  No fever or chills.  Reports dizziness and lightheadedness.  Also reports chronic back pain and leg pain.  Assessment and Plan: 1.  Sore throat, hoarseness of voice. Sepsis present on admission Meet SIRS criteria with tachycardia, leukocytosis. Concern was for retropharyngeal abscess or Ludwig's angina or angioedema. Work-up so far unremarkable. Received IV Decadron.  Leukocytosis likely associated with that. Will continue with IV vancomycin and IV Flagyl.  IV ceftriaxone added. Hold ARB.  2.  Essential hypertension Continue current blood pressure medication including Coreg, clonidine, hydralazine.  3.  Anxiety, depression Denies any suicidal ideation.  Denies any homicidal ideation. She thinks that she may have a significant illness and is concerned regarding that. Continuing current regimen.  4.  OSA Continue CPAP nightly.  5.  Chronic pain syndrome PDMP reviewed. Continue oxycodone.  6.  GERD Continue PPI.  Scheduled Meds:  carvedilol  6.25 mg Oral BID   cloNIDine  0.1 mg Transdermal Q Mon   [START ON 10/01/2020] cosyntropin  0.25 mg Intravenous Once   enoxaparin (LOVENOX) injection  60 mg Subcutaneous Q24H   escitalopram  10 mg Oral Daily   ipratropium-albuterol  3 mL Nebulization BID    mometasone-formoterol  2 puff Inhalation BID   oxybutynin  15 mg Oral Daily   pantoprazole  80 mg Oral q morning   topiramate  25 mg Oral BID   Continuous Infusions:  sodium chloride Stopped (09/30/20 1723)   cefTRIAXone (ROCEPHIN)  IV 200 mL/hr at 09/30/20 1728   metronidazole Stopped (09/30/20 1420)   vancomycin 1,000 mg (09/30/20 1956)   PRN Meds: sodium chloride, acetaminophen **OR** acetaminophen, albuterol, diazepam, hydrALAZINE, lidocaine, ondansetron **OR** ondansetron (ZOFRAN) IV, oxyCODONE, zolpidem  Body mass index is 42.01 kg/m.        DVT Prophylaxis:   Place TED hose Start: 09/29/20 1321    Advance goals of care discussion: Pt is Full code.  Family Communication: no family was present at bedside, at the time of interview.   Data Reviewed: I have personally reviewed and interpreted daily labs, tele strips, imaging. Potassium 3.3.  Serum creatinine stable.  Procalcitonin negative.  Mild worsening of leukocytosis.  Hemoglobin stable.  Physical Exam:  General: Appear in mild distress, oral mucosa clear.  No erythema noted. No significant swelling also noted. Diffuse lymphadenopathy felt bilaterally. More tender on the left. Conjunctiva normal  Cardiovascular: S1 and S2 Present, no Murmur, Respiratory: good respiratory effort, Bilateral Air entry present and CTA, no Crackles, no wheezes Abdomen: Bowel Sound present, Soft and no tenderness Extremities: no Pedal edema Neurology: alert and oriented to time, place, and person affect appropriate. no new focal deficit Gait not checked due to patient safety concerns    Vitals:   09/30/20 0836 09/30/20 1126 09/30/20 1527 09/30/20 1952  BP: 124/78 111/79 (!) 143/90   Pulse: 93 89  85   Resp: 18 16 20    Temp: 98.4 F (36.9 C) 98.1 F (36.7 C) 98.1 F (36.7 C)   TempSrc:      SpO2: 95% 96% 97% 97%  Weight:      Height:        Disposition:  Status is: Inpatient  Remains inpatient appropriate because:IV  treatments appropriate due to intensity of illness or inability to take PO  Dispo: The patient is from: Home              Anticipated d/c is to: Home              Patient currently is not medically stable to d/c.   Difficult to place patient No  Time spent: 35 minutes. I reviewed all nursing notes, pharmacy notes, vitals, pertinent old records. I have discussed plan of care as described above with RN.  Author: Berle Mull, MD [facility] 09/30/2020 8:09 PM  To reach On-call, see care teams to locate the attending and reach out via [email or web address]. Between 7PM-7AM, please contact night-coverage If you still have difficulty reaching the attending provider, please page the [facility] (Director on Call) for [facility] on amion for assistance.

## 2020-09-30 NOTE — Progress Notes (Signed)
   09/30/20 1332  Clinical Encounter Type  Visited With Patient and family together  Visit Type Follow-up;Spiritual support;Social support  Referral From Nurse  Consult/Referral To [facility] [employer] completed AD with notary and witnesses.

## 2020-09-30 NOTE — Consult Note (Signed)
Jacqueline Campbell, [name] [id number] [birth year] Jacqueline Hamman, MD  Reason for Consult: Evaluate acute left sore throat  HPI: The patient is a 60 year old obese white female who started with sore throat on the left side of her throat couple of days ago.  She feels like it is worsened such that she now having more problems with swallowing and severe pain inside her throat.  Feels like she is got some shortness of breath.  It is tender in her neck as well.  She presented the emergency room yesterday and has been put on IV antibiotics and does not feel significantly better in the last 24 hours but does not feel worse.  She has a history of having infection of the left side of her neck 30 years ago that was persisting on and off for about 1 year.  She was on antibiotics multiple times with no resolution.  She was taken in for removal of her tonsils and found to have a tumor tear of some sort.  This was removed and apparently was a very difficult operation.  She does not know the pathology of this but it was done at the old [facility] in [place]..  I tried to look for her old records and see if we can figure out what surgery was done and the pathology that was removed.  She has not had any problems with her throat since that time up until a few days ago.  She has not spit out any sputum or purulent drainage.  She has not spit up any blood.  She is not coughing and has not had a significant change in her voice although it hurts to project her voice some because she has to tighten her throat muscles.  Allergies:  Allergies  Allergen Reactions   Red Dye Anaphylaxis   Abilify [Aripiprazole]    Amitiza [Lubiprostone]    Benadryl [Diphenhydramine Hcl]    Betadine [Povidone-Iodine]    Doxycycline    Gabapentin    Iohexol    Lac Bovis Swelling   Risperidone And Related    Shellfish Allergy    Strawberry (Diagnostic) Swelling   Strawberry Extract Swelling   Aspirin Rash   Ciprofloxacin Rash   Enablex  [Darifenacin] Rash   Iodine Rash   Latex Rash   Levofloxacin Rash   Penicillins Rash   Risperidone Rash   Sulfa Antibiotics Rash    ROS: Review of systems normal other than 12 systems except per HPI.  PMH:  Past Medical History:  Diagnosis Date   Asthma    Bladder incontinence    Cancer ([place]) 1992   cervical   Chronic diastolic CHF (congestive heart failure) ([place])    a. echo 12/2014: EF 60-65%, no RWMA, GR2DD, RV mildly dilated   Chronic insomnia    Chronic interstitial cystitis    GERD (gastroesophageal reflux disease)    Glaucoma    Hypertension    Metabolic syndrome    Mood disorder (H[facility])    Nausea    Chronic   Obesity    Ovarian cancer ([place]) 1994   Plantar fasciitis    Throat cancer ([place]) 1998    FH:  Family History  Problem Relation Age of Onset   Breast cancer Mother 46       eye and ovarian   Hypertension Mother    Diabetes Mother    Heart disease Mother    COPD Mother    Stroke Mother    Cancer Father  Colon and Prostate   Heart disease Father    Leukemia Son    Prostate cancer Brother    Breast cancer Sister 12    SH:  Social History   Socioeconomic History   Marital status: Single    Spouse name: Not on file   Number of children: Not on file   Years of education: Not on file   Highest education level: Not on file  Occupational History   Not on file  Tobacco Use   Smoking status: Never   Smokeless tobacco: Never  Vaping Use   Vaping Use: Never used  Substance and Sexual Activity   Alcohol use: No    Alcohol/week: 0.0 standard drinks   Drug use: No   Sexual activity: Not Currently    Birth control/protection: Surgical  Other Topics Concern   Not on file  Social History Narrative   Not on file   Social Determinants of Health   Financial Resource Strain: Not on file  Food Insecurity: Not on file  Transportation Needs: Not on file  Physical Activity: Not on file  Stress: Not on file  Social Connections: Not on file   Intimate Partner Violence: Not on file    PSH:  Past Surgical History:  Procedure Laterality Date   [place]     multiple   BREAST BIOPSY Right 2016   sankar bx, benign   EYE SURGERY Bilateral 2006, 2008, 2010   FOOT SURGERY Bilateral 1996   KNEE SURGERY Left    age 88   [place]    Physical  Exam: Her neck does not have any significant large nodes palpable on either side.  There may be a few small shotty nodes but certainly tenderness in the left upper lateral neck level 2.  The submandibular triangle is not tender as much.  The oropharynx shows no oral lesions.  Her tongue mobility is good.  Posterior pharynx does not show any acute redness on either lateral pharyngeal wall.  The uvula is normal and the posterior pharynx looks healthy without redness or lesions.  Palpation of her left tonsillar area reveals some fullness below where the tonsil would normally be on the lateral pharyngeal wall.  This is fairly tender for her and seems to be where the cellulitis is localized.  I reviewed the CT scan in detail.  This does not show any evidence of abscess but what looks like just slight inflammation and swelling in what used to be the tonsil bed area.  There are some small swollen nodes scattered throughout the neck none being very enlarged.  Her airway is not compromised at all and her larynx looks normal.   A/P: She has evidence of the cellulitis in her lateral pharyngeal wall and possibly extending slightly supraglottic fully on the left side anteriorly.  Epiglottis is not involved nor is the larynx.  Her airway appears to be secure.  There is no good history to suggest a cause for why the cellulitis is started right here.  There could possibly still be some bits of tonsillar tissue left because of how difficult it was to get her tonsils out 30 years ago.  It is imperative that we try to find her op  note and her pathology report from that surgery 30 years ago, to see if this will give [place] any clues as to what was going on here.  Currently she  is on multiple antibiotics and I think her treatment is appropriate.  I expect her to improve in the next couple days and hopefully can be switched to oral antibiotics and discharged home on those.   Huey Romans 09/30/2020 8:37 PM

## 2020-09-30 NOTE — Plan of Care (Signed)

## 2020-10-01 ENCOUNTER — Inpatient Hospital Stay: Payer: Medicare Other

## 2020-10-01 LAB — ACTH STIMULATION, 3 TIME POINTS
Cortisol, 30 Min: 19.6 ug/dL
Cortisol, 60 Min: 22.2 ug/dL
Cortisol, Base: 10.5 ug/dL

## 2020-10-01 LAB — C3 COMPLEMENT: C3 Complement: 144 mg/dL (ref 82–167)

## 2020-10-01 LAB — C1 ESTERASE INHIBITOR: C1INH SerPl-mCnc: 31 mg/dL (ref 21–39)

## 2020-10-01 LAB — C4 COMPLEMENT: Complement C4, Body Fluid: 17 mg/dL (ref 12–38)

## 2020-10-01 MED ORDER — METRONIDAZOLE 500 MG/100ML IV SOLN
500.0000 mg | Freq: Two times a day (BID) | INTRAVENOUS | Status: DC
  Administered 2020-10-01 – 2020-10-03 (×4): 500 mg via INTRAVENOUS
  Filled 2020-10-01 (×5): qty 100

## 2020-10-01 NOTE — TOC Progression Note (Signed)
Transition of Care [facility]) - Progression Note    Patient Details  Name: Jacqueline Campbell MRN: [id number] Date of Birth: 07-13-1960  Transition of Care [facility]) CM/SW Contact  Pete Pelt, RN Phone Number: 10/01/2020, 2:42 PM  Clinical Narrative:   Patient with ice pack on her mouth, unable to speak but nodded head during TOC assessment.  Patient lives at home with family who can assist her at home as needed.  She has no concerns about returning home after discharge.  She has no concerns about transporting to appointments, is able to obtain medications and take them as appropriate.  She currently has no home health and does not feel she requires it at this time.  Declines further TOC needs, TOC contact information given.     Expected Discharge Plan: Home/Self Care Barriers to Discharge: Continued Medical Work up  Expected Discharge Plan and Services Expected Discharge Plan: Home/Self Care   Discharge Planning Services: CM Consult   Living arrangements for the past 2 months: Single Family Home                                       Social Determinants of Health (SDOH) Interventions    Readmission Risk Interventions No flowsheet data found.

## 2020-10-01 NOTE — Progress Notes (Addendum)
[facility] Progress Note  Patient: Jacqueline Campbell    ZYS:[id number]  DOA: 09/29/2020     Date of Service: the patient was seen and examined on 10/01/2020  Brief hospital course: Past medical history of tonsillectomy, HTN, HFpEF, obesity, OSA, depression, anxiety.  Presents with complaints of shortness of breath and sore throat.  Also reports muffled voice.  Ongoing for 2 weeks.  Progressively worsening. Appears to have cellulitis of the face as well as tender lymphadenopathy Currently plan is IV antibiotics, check CT maxillofacial.  Subjective: Sore throat still present but improving.  Redness is improving.  No nausea no vomiting. No chest pain.  No abdominal pain.  No diarrhea.  Assessment and Plan: 1.  Sore throat, hoarseness of voice. Sepsis present on admission Suspect facial, preseptal cellulitis Meet SIRS criteria with tachycardia, leukocytosis. Concern was for retropharyngeal abscess or Ludwig's angina or angioedema. Work-up so far unremarkable. Received IV Decadron.  Leukocytosis likely associated with that. Will continue with IV vancomycin and IV Flagyl.  IV ceftriaxone added. Hold ARB. Appreciate ENT assistance.  Does not appear to be having any significant concern for Ludewig's angina. Will check CT maxillofacial.  2.  Essential hypertension Continue current blood pressure medication including Coreg, clonidine, hydralazine.  3.  Anxiety, depression Denies any suicidal ideation.  Denies any homicidal ideation. She thinks that she may have a significant illness and is concerned regarding that. Continuing current regimen.  4.  OSA Continue CPAP nightly.  5.  Chronic pain syndrome PDMP reviewed. Continue oxycodone.  6.  GERD Continue PPI.  7.  Hypokalemia. Corrected.  Scheduled Meds:  carvedilol  6.25 mg Oral BID   cloNIDine  0.1 mg Transdermal Q Mon   enoxaparin (LOVENOX) injection  60 mg Subcutaneous Q24H   escitalopram  10 mg Oral Daily    ipratropium-albuterol  3 mL Nebulization BID   mometasone-formoterol  2 puff Inhalation BID   oxybutynin  15 mg Oral Daily   pantoprazole  80 mg Oral q morning   topiramate  25 mg Oral BID   Continuous Infusions:  sodium chloride Stopped (09/30/20 1723)   cefTRIAXone (ROCEPHIN)  IV 2 g (10/01/20 1726)   metronidazole 500 mg (10/01/20 1758)   PRN Meds: sodium chloride, acetaminophen **OR** acetaminophen, albuterol, diazepam, hydrALAZINE, lidocaine, ondansetron **OR** ondansetron (ZOFRAN) IV, oxyCODONE, traZODone  Body mass index is 42.01 kg/m.        DVT Prophylaxis:   Place TED hose Start: 09/29/20 1321    Advance goals of care discussion: Pt is Full code.  Family Communication: no family was present at bedside, at the time of interview.   Data Reviewed: I have personally reviewed and interpreted daily labs, tele strips, imaging. Cosyntropin stimulation test negative.  Physical Exam:  General: Appear in mild distress, improving redness and swelling on the left side of the face.  Poor dentition present. Cardiovascular: S1 and S2 Present, no Murmur, Respiratory: good respiratory effort, Bilateral Air entry present and CTA, no Crackles, no wheezes Abdomen: Bowel Sound present, Soft and no tenderness Extremities: no Pedal edema Neurology: alert and oriented to time, place, and person affect appropriate. no new focal deficit Gait not checked due to patient safety concerns   Vitals:   10/01/20 0433 10/01/20 0742 10/01/20 1218 10/01/20 1709  BP: (!) 150/80 127/65 (!) 143/86 (!) 159/95  Pulse: 79 83 75 82  Resp: 20 15 18 16   Temp: 97.9 F (36.6 C) 98.6 F (37 C) 98.6 F (37 C) 98.6 F (37 C)  TempSrc: Oral     SpO2: 100% 93% 96% 97%  Weight:      Height:        Disposition:  Status is: Inpatient  Remains inpatient appropriate because:IV treatments appropriate due to intensity of illness or inability to take PO  Dispo: The patient is from: Home               Anticipated d/c is to: Home              Patient currently is not medically stable to d/c.   Difficult to place patient No  Time spent: 35 minutes. I reviewed all nursing notes, pharmacy notes, vitals, pertinent old records. I have discussed plan of care as described above with RN.  Author: Berle Mull, MD [facility] 10/01/2020 7:10 PM  To reach On-call, see care teams to locate the attending and reach out via [email or web address]. Between 7PM-7AM, please contact night-coverage If you still have difficulty reaching the attending provider, please page the [facility] (Director on Call) for [facility] on amion for assistance.

## 2020-10-02 LAB — MAGNESIUM: Magnesium: 2.3 mg/dL (ref 1.7–2.4)

## 2020-10-02 LAB — CBC
HCT: 40 % (ref 36.0–46.0)
Hemoglobin: 13.3 g/dL (ref 12.0–15.0)
MCH: 30.8 pg (ref 26.0–34.0)
MCHC: 33.3 g/dL (ref 30.0–36.0)
MCV: 92.6 fL (ref 80.0–100.0)
Platelets: 216 10*3/uL (ref 150–400)
RBC: 4.32 MIL/uL (ref 3.87–5.11)
RDW: 12.9 % (ref 11.5–15.5)
WBC: 11.1 10*3/uL — ABNORMAL HIGH (ref 4.0–10.5)
nRBC: 0 % (ref 0.0–0.2)

## 2020-10-02 LAB — BASIC METABOLIC PANEL
Anion gap: 10 (ref 5–15)
BUN: 9 mg/dL (ref 6–20)
CO2: 23 mmol/L (ref 22–32)
Calcium: 8.9 mg/dL (ref 8.9–10.3)
Chloride: 100 mmol/L (ref 98–111)
Creatinine, Ser: 0.72 mg/dL (ref 0.44–1.00)
GFR, Estimated: >60 mL/min (ref >60)
Glucose, Bld: 114 mg/dL — ABNORMAL HIGH (ref 70–99)
Potassium: 3.5 mmol/L (ref 3.5–5.1)
Sodium: 133 mmol/L — ABNORMAL LOW (ref 135–145)

## 2020-10-02 MED ORDER — DEXAMETHASONE SODIUM PHOSPHATE 10 MG/ML IJ SOLN: 8.0000 mg | INTRAMUSCULAR | Status: DC

## 2020-10-02 MED ORDER — DEXAMETHASONE SODIUM PHOSPHATE 10 MG/ML IJ SOLN
8.0000 mg | Freq: Once | INTRAMUSCULAR | Status: AC
  Administered 2020-10-02: 10:00:00 8 mg via INTRAVENOUS
  Filled 2020-10-02: qty 1

## 2020-10-02 NOTE — Progress Notes (Signed)
[facility] Progress Note  Patient: Jacqueline Campbell    EVO:[id number]  DOA: 09/29/2020     Date of Service: the patient was seen and examined on 10/02/2020  Brief hospital course: Past medical history of tonsillectomy, HTN, HFpEF, obesity, OSA, depression, anxiety.  Presents with complaints of shortness of breath and sore throat.  Also reports muffled voice.  Ongoing for 2 weeks.  Progressively worsening. Appears to have cellulitis of the face as well as tender lymphadenopathy Currently plan is IV antibiotics, check CT maxillofacial.  Subjective: Sore throat still present but improving.  Redness is improving.  No nausea no vomiting. No chest pain.  No abdominal pain.  No diarrhea.  Assessment and Plan:  Severe pharyngitis -Maxillofacial CT noted asymmetric left-sided pharyngeal soft tissue thickening which may reflect pharyngitis -Evaluated by ENT Dr.Juengel, on exam he noted a cellulitis of the lateral pharyngeal wall extending to the supraglottic area, airway was noted to be secure. -Recommend supportive care with antibiotics -Previous history of tonsillectomy -Slow clinical improvement, continue Rocephin and Flagyl -Will also give a dose of IV Decadron x1 today -Advance to soft diet, follow-up with ENT in 1 to 2 weeks after discharge  Essential hypertension Continue current blood pressure medication including Coreg, clonidine, hydralazine.  Anxiety, depression -Stable, continue home meds  OSA Continue CPAP nightly.  Chronic pain syndrome -Continue oxycodone.  GERD -Continue PPI.  Hypokalemia. Corrected.  DVT prophylaxis: Lovenox   Advance goals of care discussion: Pt is Full code.  Family Communication: no family was present at bedside, at the time of interview.    Disposition:  Status is: Inpatient  Remains inpatient appropriate because:IV treatments appropriate due to intensity of illness or inability to take PO  Dispo: The patient is from: Home               Anticipated d/c is to: Home              Patient currently is not medically stable to d/c.   Difficult to place patient No  Scheduled Meds:  carvedilol  6.25 mg Oral BID   cloNIDine  0.1 mg Transdermal Q Mon   enoxaparin (LOVENOX) injection  60 mg Subcutaneous Q24H   escitalopram  10 mg Oral Daily   ipratropium-albuterol  3 mL Nebulization BID   mometasone-formoterol  2 puff Inhalation BID   oxybutynin  15 mg Oral Daily   pantoprazole  80 mg Oral q morning   topiramate  25 mg Oral BID   Continuous Infusions:  sodium chloride 1,000 mL (10/02/20 0514)   cefTRIAXone (ROCEPHIN)  IV Stopped (10/02/20 0156)   metronidazole 500 mg (10/02/20 0516)   PRN Meds: sodium chloride, acetaminophen **OR** acetaminophen, albuterol, diazepam, hydrALAZINE, lidocaine, ondansetron **OR** ondansetron (ZOFRAN) IV, oxyCODONE, traZODone  Body mass index is 42.01 kg/m.     Physical Exam:  General: AAOx3, obese, no distress HEENT: Left submandibular lymphadenopathy, tenderness anterior to left sternocleidomastoid CVS: S1-S2, regular rate rhythm Lungs: Clear bilaterally Abdomen: Soft, nontender, bowel sounds present Extremities: No edema  Skin: No rash on exposed skin  Vitals:   10/02/20 0433 10/02/20 0435 10/02/20 0755 10/02/20 1120  BP: (!) 161/78 135/82 (!) 156/75 110/64  Pulse: 82 87 86 74  Resp: 20  (!) 21 18  Temp: 98.1 F (36.7 C)  98.7 F (37.1 C) 97.8 F (36.6 C)  TempSrc: Oral     SpO2: 96%  94% 94%  Weight:      Height:  Time spent: 35 minutes.  Author: Domenic Polite, MD [facility] 10/02/2020 2:21 PM

## 2020-10-02 NOTE — Progress Notes (Addendum)
[facility] Progress Note  Patient: Jacqueline Campbell    ZOX:[id number]  DOA: 09/29/2020     Date of Service: the patient was seen and examined on 10/02/2020  Brief hospital course: Past medical history of tonsillectomy, HTN, HFpEF, obesity, OSA, depression, anxiety.  Presents with complaints of shortness of breath and sore throat.  Also reports muffled voice.  Ongoing for 2 weeks.  Progressively worsening. Appears to have cellulitis of the face as well as tender lymphadenopathy Currently plan is IV antibiotics, check CT maxillofacial.  Subjective: Sore throat still present but improving.  Redness is improving.  No nausea no vomiting. No chest pain.  No abdominal pain.  No diarrhea.  Assessment and Plan:  Severe pharyngitis -Maxillofacial CT noted asymmetric left-sided pharyngeal soft tissue thickening which may reflect pharyngitis -Evaluated by ENT Dr.Juengel, on exam he noted a cellulitis of the lateral pharyngeal wall extending to the supraglottic area, airway was noted to be secure. -Recommend supportive care with antibiotics, blood Cx, COVID and influenza PCR negative, group A strep PCR neg -Previous history of tonsillectomy -Slow clinical improvement, continue Rocephin and Flagyl -Will also give a dose of IV Decadron x1 today -Advance to soft diet, follow-up with ENT in 1 to 2 weeks after discharge  Essential hypertension Continue current blood pressure medication including Coreg, clonidine, hydralazine.  Chronic diastolic CHF -compensated, resume torsemide tomorrow  Anxiety, depression -Stable, continue home meds  OSA Continue CPAP nightly.  Chronic pain syndrome -Continue oxycodone.  GERD -Continue PPI.  Hypokalemia. Corrected.  DVT prophylaxis: Lovenox   Advance goals of care discussion: Pt is Full code.  Family Communication: no family was present at bedside, at the time of interview.    Disposition:  Status is: Inpatient  Remains inpatient  appropriate because:IV treatments appropriate due to intensity of illness or inability to take PO  Dispo: The patient is from: Home              Anticipated d/c is to: Home              Patient currently is not medically stable to d/c.   Difficult to place patient No  Scheduled Meds:  carvedilol  6.25 mg Oral BID   cloNIDine  0.1 mg Transdermal Q Mon   enoxaparin (LOVENOX) injection  60 mg Subcutaneous Q24H   escitalopram  10 mg Oral Daily   ipratropium-albuterol  3 mL Nebulization BID   mometasone-formoterol  2 puff Inhalation BID   oxybutynin  15 mg Oral Daily   pantoprazole  80 mg Oral q morning   topiramate  25 mg Oral BID   Continuous Infusions:  sodium chloride 1,000 mL (10/02/20 0514)   cefTRIAXone (ROCEPHIN)  IV Stopped (10/02/20 0156)   metronidazole 500 mg (10/02/20 0516)   PRN Meds: sodium chloride, acetaminophen **OR** acetaminophen, albuterol, diazepam, hydrALAZINE, lidocaine, ondansetron **OR** ondansetron (ZOFRAN) IV, oxyCODONE, traZODone  Body mass index is 42.01 kg/m.     Physical Exam:  General: AAOx3, obese, no distress HEENT: Left submandibular lymphadenopathy, tenderness anterior to left sternocleidomastoid CVS: S1-S2, regular rate rhythm Lungs: Clear bilaterally Abdomen: Soft, nontender, bowel sounds present Extremities: No edema  Skin: No rash on exposed skin  Vitals:   10/02/20 0433 10/02/20 0435 10/02/20 0755 10/02/20 1120  BP: (!) 161/78 135/82 (!) 156/75 110/64  Pulse: 82 87 86 74  Resp: 20  (!) 21 18  Temp: 98.1 F (36.7 C)  98.7 F (37.1 C) 97.8 F (36.6 C)  TempSrc: Oral  SpO2: 96%  94% 94%  Weight:      Height:         Time spent: 35 minutes.  Author: Domenic Polite, MD [facility] 10/02/2020 2:28 PM

## 2020-10-02 NOTE — Progress Notes (Signed)
Patient continues to refuse the use of hospital cpap/bipap unit

## 2020-10-03 DIAGNOSIS — L03221 Cellulitis of neck: Secondary | ICD-10-CM

## 2020-10-03 LAB — CBC
HCT: 40.4 % (ref 36.0–46.0)
Hemoglobin: 13.7 g/dL (ref 12.0–15.0)
MCH: 31.4 pg (ref 26.0–34.0)
MCHC: 33.9 g/dL (ref 30.0–36.0)
MCV: 92.4 fL (ref 80.0–100.0)
Platelets: 246 10*3/uL (ref 150–400)
RBC: 4.37 MIL/uL (ref 3.87–5.11)
RDW: 12.6 % (ref 11.5–15.5)
WBC: 15.4 10*3/uL — ABNORMAL HIGH (ref 4.0–10.5)
nRBC: 0 % (ref 0.0–0.2)

## 2020-10-03 MED ORDER — CEFDINIR 300 MG PO CAPS: 300.0000 mg | ORAL_CAPSULE | Freq: Two times a day (BID) | ORAL | 0 refills | Status: AC

## 2020-10-03 NOTE — Care Management Important Message (Signed)
Important Message  Patient Details  Name: Jacqueline Campbell MRN: [id number] Date of Birth: 25-May-1960   Medicare Important Message Given:  N/A - LOS <3 / Initial given by admissions     [name] 10/03/2020, 8:26 AM

## 2020-10-03 NOTE — Progress Notes (Signed)
Pt being discharged home, discharge instructions reviewed with pt, states understanding, pt with no complaints, awaiting brother for transport

## 2020-10-04 LAB — CULTURE, BLOOD (ROUTINE X 2)
Culture: NO GROWTH
Culture: NO GROWTH
Special Requests: ADEQUATE

## 2020-10-05 NOTE — Discharge Summary (Signed)
Physician Discharge Summary  Jacqueline Campbell [id number] DOB: 02/07/1961 DOA: 09/29/2020  PCP: Kirk Ruths, MD  Admit date: 09/29/2020 Discharge date: 10/03/2020  Time spent: 35 minutes  Recommendations for Outpatient Follow-up:  ENT Dr.Juengel in 2-3weeks PCP in 1 week   Discharge Diagnoses:  Principal Problem:   Sepsis ([place])   Cellulitis of lateral pharyngeal wall   Moderate persistent asthma   OSA on CPAP   Morbid obesity with BMI of 40.0-44.9, adult (HCC)   Insomnia   Hypertension, benign   Dyslipidemia   GERD (gastroesophageal reflux disease)   GAD (generalized anxiety disorder)   Chronic pain of left knee   OSA treated with BiPAP   Morbid obesity (HCC)   Depression, major, in remission ([place])   Heart failure with preserved ejection fraction ([place])   Cellulitis   Discharge Condition: stable  Diet recommendation: low sodium  Filed Weights   09/29/20 0820  Weight: 123.5 kg    History of present illness: 60/F w/ Past medical history of tonsillectomy, HTN, HFpEF, obesity, OSA, depression, anxiety.  Presents with complaints of shortness of breath and sore throat.  Also reports muffled voice.  Ongoing for 2 weeks.  Progressively worsening.  Hospital Course:   Cellulitis of lateral pharyngeal wall Pharyngitis -Maxillofacial CT noted asymmetric left-sided pharyngeal soft tissue thickening which may reflect pharyngitis -Evaluated by ENT Dr.Juengel, he noted a cellulitis of the lateral pharyngeal wall extending to the supraglottic area, airway was noted to be secure. -Recommend supportive care with antibiotics, blood Cx, COVID and influenza PCR negative, group A strep PCR neg -Previous history of tonsillectomy -clinically improved with Rocephin,  Flagyl and a dose of decadron -tolerating diet, symptoms better -Advised  follow-up with ENT Dr.Juengel in 2 weeks after discharge   Essential hypertension Continue current blood pressure medication including Coreg,  clonidine, hydralazine.   Chronic diastolic CHF -compensated, resumed torsemide    Anxiety, depression -Stable, continue home meds   OSA Continue CPAP nightly.   Chronic pain syndrome -Continue oxycodone.   GERD -Continue PPI.   Hypokalemia. Corrected.    Discharge Exam: Vitals:   10/03/20 0412 10/03/20 0806  BP: 131/70 (!) 113/46  Pulse: 82 73  Resp: 18 18  Temp: 97.9 F (36.6 C) 97.6 F (36.4 C)  SpO2: 98% 95%    General: AAOx3 Cardiovascular: S1S2/RRR Respiratory:CTAB  Discharge Instructions   Discharge Instructions     Diet - low sodium heart healthy   Complete by: As directed    Discharge wound care:   Complete by: As directed    routine   Increase activity slowly   Complete by: As directed       Allergies as of 10/03/2020       Reactions   Red Dye Anaphylaxis   Abilify [aripiprazole]    Amitiza [lubiprostone]    Benadryl [diphenhydramine Hcl]    Betadine [povidone-iodine]    Doxycycline    Gabapentin    Iohexol    Lac Bovis Swelling   Risperidone And Related    Shellfish Allergy    Strawberry (diagnostic) Swelling   Strawberry Extract Swelling   Aspirin Rash   Ciprofloxacin Rash   Enablex [darifenacin] Rash   Iodine Rash   Latex Rash   Levofloxacin Rash   Penicillins Rash   Risperidone Rash   Sulfa Antibiotics Rash        Medication List     STOP taking these medications    metroNIDAZOLE 0.75 % vaginal gel Commonly known as: METROGEL  tiZANidine 4 MG tablet Commonly known as: ZANAFLEX   zolpidem 5 MG tablet Commonly known as: AMBIEN       TAKE these medications    acyclovir ointment 5 % Commonly known as: ZOVIRAX Apply 1 application topically 5 (five) times daily as needed.   albuterol (2.5 MG/3ML) 0.083% nebulizer solution Commonly known as: PROVENTIL Take 2.5 mg by nebulization every 6 (six) hours as needed for wheezing or shortness of breath.   amLODipine 5 MG tablet Commonly known as: NORVASC TAKE  1 TABLET BY MOUTH NIGTHLY   budesonide-formoterol 80-4.5 MCG/ACT inhaler Commonly known as: SYMBICORT Inhale 2 puffs into the lungs 2 (two) times daily.   carvedilol 6.25 MG tablet Commonly known as: COREG Take 1 tablet (6.25 mg total) by mouth 2 (two) times daily.   cefdinir 300 MG capsule Commonly known as: OMNICEF Take 1 capsule (300 mg total) by mouth 2 (two) times daily for 5 days.   diazepam 5 MG tablet Commonly known as: VALIUM Take 1 tablet (5 mg total) by mouth every 12 (twelve) hours as needed for anxiety.   dorzolamide 2 % ophthalmic solution Commonly known as: TRUSOPT Place 1 drop into both eyes 3 (three) times daily.   EPINEPHRINE IJ Inject as directed as needed. Epi pen   escitalopram 10 MG tablet Commonly known as: LEXAPRO Take 10 mg by mouth daily.   esomeprazole 40 MG capsule Commonly known as: NEXIUM Take 1 capsule (40 mg total) by mouth daily before breakfast.   ipratropium-albuterol 0.5-2.5 (3) MG/3ML Soln Commonly known as: DUONEB Take 3 mLs by nebulization 2 (two) times daily.   linaclotide 145 MCG Caps capsule Commonly known as: Linzess Take 1 capsule (145 mcg total) by mouth daily.   loratadine 10 MG tablet Commonly known as: CLARITIN TAKE ONE (1) TABLET EACH DAY   meloxicam 7.5 MG tablet Commonly known as: MOBIC Take 1 tablet (7.5 mg total) by mouth daily.   mometasone 50 MCG/ACT nasal spray Commonly known as: Nasonex Place 2 sprays into the nose daily.   montelukast 10 MG tablet Commonly known as: SINGULAIR Take 1 tablet (10 mg total) by mouth at bedtime.   oxybutynin 15 MG 24 hr tablet Commonly known as: DITROPAN XL TAKE 1 TABLET BY MOUTH DAILY   pantoprazole 40 MG tablet Commonly known as: PROTONIX Take 40 mg by mouth daily.   potassium chloride SA 20 MEQ tablet Commonly known as: KLOR-CON TAKE ONE (1) TABLET EACH DAY   promethazine 25 MG tablet Commonly known as: PHENERGAN Take 1 tablet (25 mg total) by mouth every 6  (six) hours as needed for nausea.   topiramate 25 MG tablet Commonly known as: TOPAMAX Take 25 mg by mouth 2 (two) times daily.   torsemide 20 MG tablet Commonly known as: DEMADEX Take 3 tablets (60 mg total) by mouth daily.   valACYclovir 500 MG tablet Commonly known as: VALTREX Take 1 tablet (500 mg total) by mouth 1 day or 1 dose.   valsartan-hydrochlorothiazide 320-25 MG tablet Commonly known as: DIOVAN-HCT Take 1 tablet by mouth daily.       ASK your doctor about these medications    cloNIDine 0.1 mg/24hr patch Commonly known as: CATAPRES - Dosed in mg/24 hr Place 1 patch (0.1 mg total) onto the skin once a week.   Fluticasone-Salmeterol 500-50 MCG/DOSE Aepb Commonly known as: ADVAIR Inhale 1 puff into the lungs every 12 (twelve) hours.   oxyCODONE 15 MG immediate release tablet Commonly known as: ROXICODONE Limit 1  tab by mouth 3 - 4  times per day if tolerated               Discharge Care Instructions  (From admission, onward)           Start     Ordered   10/03/20 0000  Discharge wound care:       Comments: routine   10/03/20 1038           Allergies  Allergen Reactions   Red Dye Anaphylaxis   Abilify [Aripiprazole]    Amitiza [Lubiprostone]    Benadryl [Diphenhydramine Hcl]    Betadine [Povidone-Iodine]    Doxycycline    Gabapentin    Iohexol    Lac Bovis Swelling   Risperidone And Related    Shellfish Allergy    Strawberry (Diagnostic) Swelling   Strawberry Extract Swelling   Aspirin Rash   Ciprofloxacin Rash   Enablex [Darifenacin] Rash   Iodine Rash   Latex Rash   Levofloxacin Rash   Penicillins Rash   Risperidone Rash   Sulfa Antibiotics Rash    Follow-up Information     Kirk Ruths, MD. Schedule an appointment as soon as possible for a visit.   Specialty: Internal Medicine Contact information: [place] [address] [phone]         Margaretha Sheffield,  MD. Schedule an appointment as soon as possible for a visit in 2 week(s).   Specialty: Otolaryngology Contact information: [address] [place] [address] [phone]                  The results of significant diagnostics from this hospitalization (including imaging, microbiology, ancillary and laboratory) are listed below for reference.    Significant Diagnostic Studies: DG Chest 2 View  Result Date: 09/29/2020 CLINICAL DATA:  60 year old female with shortness of breath, sore throat, fever cough and congestion. EXAM: CHEST - 2 VIEW COMPARISON:  Chest radiograph 10/05/2016 and earlier. FINDINGS: Lung volumes and mediastinal contours remain normal. Visualized tracheal air column is within normal limits. No pneumothorax, pulmonary edema, pleural effusion or confluent pulmonary opacity. Mild chronic increased interstitial markings in both lungs appears stable or decreased since 2018. Degenerative endplate spurring in the spine. No acute osseous abnormality identified. Negative visible bowel gas pattern. IMPRESSION: No acute cardiopulmonary abnormality. Electronically Signed   By: Genevie Ann M.D.   On: 09/29/2020 08:56   CT Soft Tissue Neck Wo Contrast  Result Date: 09/29/2020 CLINICAL DATA:  Left neck and midline inflammatory changes. Rule out abscess or Ludwig's angina. EXAM: CT NECK WITHOUT CONTRAST TECHNIQUE: Multidetector CT imaging of the neck was performed following the standard protocol without intravenous contrast. COMPARISON:  None. FINDINGS: Pharynx and larynx: Normal. No mass or swelling. Negative for pharyngeal abscess. Salivary glands: No inflammation, mass, or stone. Thyroid: Negative Lymph nodes: Numerous small lymph nodes are present throughout the neck bilaterally. These are relatively symmetric. No pathologically enlarged lymph nodes are identified. Vascular: Limited vascular evaluation without intravenous contrast. Limited intracranial: Negative Visualized  orbits: Negative Mastoids and visualized paranasal sinuses: Negative Skeleton: No acute skeletal abnormality. Upper chest: Lung apices clear bilaterally. Other: None IMPRESSION: Negative for mass or acute inflammation in the neck. Negative for pharyngeal abscess Scattered small lymph nodes are widely distributed through the neck bilaterally. These may be reactive nodes, particularly the patient has clinical symptoms of pharyngitis. Lymphoma not excluded. Close follow-up and biopsy recommended if clinically indicated. Correlate with  lab values. Electronically Signed   By: Franchot Gallo M.D.   On: 09/29/2020 12:38   CT MAXILLOFACIAL WO CONTRAST  Result Date: 10/01/2020 CLINICAL DATA:  Facial cellulitis. Left-sided facial and neck pain. Sore throat. Voice hoarseness. Sepsis. EXAM: CT MAXILLOFACIAL WITHOUT CONTRAST TECHNIQUE: Multidetector CT imaging of the maxillofacial structures was performed. Multiplanar CT image reconstructions were also generated. COMPARISON:  Neck CT 09/29/2020 FINDINGS: Osseous: No acute fracture or destructive osseous process. Orbits: Unremarkable. Sinuses: Paranasal sinuses and mastoid air cells are clear. Soft tissues: Partially visualized prominent lymph nodes in the neck bilaterally including a 10 mm short axis left level IIA node, similar to the recent neck CT. Moderate to prominent asymmetric low-density thickening of the left lateral oropharyngeal soft tissues extending into the hypopharynx. Effacement of the left vallecular and left piriform sinus. No retropharyngeal fluid collection. Limited assessment for a peritonsillar/parapharyngeal fluid collection on this unenhanced study. Limited intracranial: Unremarkable. IMPRESSION: 1. Asymmetric left-sided pharyngeal soft tissue thickening which may reflect pharyngitis with this history. Correlate with direct visualization as neoplasm is also possible. No definite abscess on this unenhanced study. 2. Mild cervical lymphadenopathy as  detailed on the recent neck CT. Electronically Signed   By: Logan Bores M.D.   On: 10/01/2020 19:51    Microbiology: Recent Results (from the past 240 hour(s))  Group A Strep by PCR     Status: None   Collection Time: 09/29/20 11:48 AM   Specimen: Nasopharyngeal Swab; Sterile Swab  Result Value Ref Range Status   Group A Strep by PCR NOT DETECTED NOT DETECTED Final    Comment: Performed at [facility], [address] [place], [place] [address]  Resp Panel by RT-PCR (Flu A&B, Covid) Nasopharyngeal Swab     Status: None   Collection Time: 09/29/20 11:48 AM   Specimen: Nasopharyngeal Swab; Nasopharyngeal(NP) swabs in vial transport medium  Result Value Ref Range Status   SARS Coronavirus 2 by RT PCR NEGATIVE NEGATIVE Final    Comment: (NOTE) SARS-CoV-2 target nucleic acids are NOT DETECTED.  The SARS-CoV-2 RNA is generally detectable in upper respiratory specimens during the acute phase of infection. The lowest concentration of SARS-CoV-2 viral copies this assay can detect is 138 copies/mL. A negative result does not preclude SARS-Cov-2 infection and should not be used as the sole basis for treatment or other patient management decisions. A negative result may occur with  improper specimen collection/handling, submission of specimen other than nasopharyngeal swab, presence of viral mutation(s) within the areas targeted by this assay, and inadequate number of viral copies(<138 copies/mL). A negative result must be combined with clinical observations, patient history, and epidemiological information. The expected result is Negative.  Fact Sheet for Patients:  EntrepreneurPulse.com.au  Fact Sheet for Healthcare Providers:  IncredibleEmployment.be  This test is no t yet approved or cleared by the [place] FDA and  has been authorized for detection and/or diagnosis of SARS-CoV-2 by FDA under an Emergency Use Authorization (EUA). This  EUA will remain  in effect (meaning this test can be used) for the duration of the COVID-19 declaration under Section 564(b)(1) of the Act, 21 U.S.C.section 360bbb-3(b)(1), unless the authorization is terminated  or revoked sooner.       Influenza A by PCR NEGATIVE NEGATIVE Final   Influenza B by PCR NEGATIVE NEGATIVE Final    Comment: (NOTE) The Xpert Xpress SARS-CoV-2/FLU/RSV plus assay is intended as an aid in the diagnosis of influenza from Nasopharyngeal swab specimens and should not be used as  a sole basis for treatment. Nasal washings and aspirates are unacceptable for Xpert Xpress SARS-CoV-2/FLU/RSV testing.  Fact Sheet for Patients: EntrepreneurPulse.com.au  Fact Sheet for Healthcare Providers: IncredibleEmployment.be  This test is not yet approved or cleared by the [place] FDA and has been authorized for detection and/or diagnosis of SARS-CoV-2 by FDA under an Emergency Use Authorization (EUA). This EUA will remain in effect (meaning this test can be used) for the duration of the COVID-19 declaration under Section 564(b)(1) of the Act, 21 U.S.C. section 360bbb-3(b)(1), unless the authorization is terminated or revoked.  Performed at [facility], [address] [place], [place] [address]   Blood culture (routine x 2)     Status: None   Collection Time: 09/29/20 11:55 AM   Specimen: BLOOD  Result Value Ref Range Status   Specimen Description BLOOD  RT [facility]  Final   Special Requests   Final    BOTTLES DRAWN AEROBIC AND ANAEROBIC Blood Culture adequate volume   Culture   Final    NO GROWTH 5 DAYS Performed at [facility], [address], [place], [place] [address]    Report Status 10/04/2020 FINAL  Final  Blood culture (routine x 2)     Status: None   Collection Time: 09/29/20 11:59 AM   Specimen: BLOOD  Result Value Ref Range Status   Specimen Description BLOOD LT UPPER ARM  Final   Special  Requests   Final    BOTTLES DRAWN AEROBIC AND ANAEROBIC Blood Culture results may not be optimal due to an inadequate volume of blood received in culture bottles   Culture   Final    NO GROWTH 5 DAYS Performed at [facility], [address] [place], [place] [address]    Report Status 10/04/2020 FINAL  Final     Labs: Basic Metabolic Panel: Recent Labs  Lab 09/29/20 1148 09/30/20 0558 10/02/20 0425  NA 136 138 133*  K 3.9 3.3* 3.5  CL 98 104 100  CO2 '26 24 23  '$ GLUCOSE 104* 129* 114*  BUN '12 9 9  '$ CREATININE 0.62 0.69 0.72  CALCIUM 9.4 9.3 8.9  MG  --   --  2.3   Liver Function Tests: Recent Labs  Lab 09/29/20 1148  AST 32  ALT 25  ALKPHOS 78  BILITOT 0.7  PROT 8.2*  ALBUMIN 4.4   No results for input(s): LIPASE, AMYLASE in the last 168 hours. No results for input(s): AMMONIA in the last 168 hours. CBC: Recent Labs  Lab 09/29/20 1148 09/30/20 0558 10/02/20 0425 10/03/20 0538  WBC 16.3* 17.5* 11.1* 15.4*  NEUTROABS 12.1*  --   --   --   HGB 14.5 13.4 13.3 13.7  HCT 43.6 39.8 40.0 40.4  MCV 93.2 90.7 92.6 92.4  PLT 273 263 216 246   Cardiac Enzymes: No results for input(s): CKTOTAL, CKMB, CKMBINDEX, TROPONINI in the last 168 hours. BNP: BNP (last 3 results) Recent Labs    09/29/20 1148  BNP 20.8    ProBNP (last 3 results) No results for input(s): PROBNP in the last 8760 hours.  CBG: No results for input(s): GLUCAP in the last 168 hours.     Signed:  Domenic Polite MD.  [facility] 10/05/2020, 4:54 PM

## 2021-06-18 ENCOUNTER — Other Ambulatory Visit: Payer: Self-pay | Admitting: Physician Assistant

## 2021-06-18 DIAGNOSIS — M7989 Other specified soft tissue disorders: Secondary | ICD-10-CM

## 2021-06-20 ENCOUNTER — Telehealth: Payer: Self-pay | Admitting: Cardiovascular Disease

## 2021-06-20 NOTE — Telephone Encounter (Signed)
Attempted to schedule line busy  ?

## 2021-06-20 NOTE — Telephone Encounter (Signed)
-----   Message from Janan Ridge, [place] sent at 06/18/2021  1:45 PM EDT ----- ?Regarding: Appt needed ?Patient needs a 12 month follow up -- [place] patient.  ? ?

## 2021-07-02 NOTE — Telephone Encounter (Signed)
Attempted to schedule.  LMOV to call office.  ° °

## 2021-07-09 ENCOUNTER — Ambulatory Visit: Payer: Medicare Other | Admitting: Dermatology

## 2021-07-21 NOTE — Telephone Encounter (Signed)
Attempted to schedule. Brother having surgery so she will call back .  ?

## 2021-07-30 ENCOUNTER — Telehealth: Payer: Self-pay | Admitting: Cardiovascular Disease

## 2021-07-30 MED ORDER — AMLODIPINE BESYLATE 5 MG PO TABS: ORAL_TABLET | ORAL | 1 refills | Status: AC

## 2021-07-30 NOTE — Telephone Encounter (Signed)
?*  STAT* If patient is at the pharmacy, call can be transferred to refill team. ? ? ?1. Which medications need to be refilled? (please list name of each medication and dose if known) amLODipine (NORVASC) 5 MG tablet ? ?2. Which pharmacy/location (including street and city if local pharmacy) is medication to be sent to?[place], [place] ? ?3. Do they need a 30 day or 90 day supply? 90 day  ?

## 2021-07-30 NOTE — Telephone Encounter (Signed)
Requested Prescriptions  ? ?Signed Prescriptions Disp Refills  ? amLODipine (NORVASC) 5 MG tablet 60 tablet 1  ?  Sig: TAKE 1 TABLET BY MOUTH NIGTHLY  ?  Authorizing Provider: Minna Merritts  ?  Ordering User: Raelene Bott, Ofelia Podolski L  ? ?Patient overdue for appointment. 90 day refills to be authorized after being seen in office per Mila Merry, RN ?

## 2021-08-27 ENCOUNTER — Ambulatory Visit: Payer: Medicare Other | Admitting: Dermatology

## 2021-09-20 NOTE — Progress Notes (Deleted)
Cardiology Office Note  Date:  09/20/2021   ID:  [name], DOB 1960/10/19, MRN [id number]  PCP:  Kirk Ruths, MD   No chief complaint on file.   HPI:  Ms. Jacqueline Campbell is a 61 year old woman with  morbid obesity,  general anxiety disorder,  Chronic back pain chronic cystitis  Obstructive sleep apnea on Bipap,  worsening symptoms of lower extremity edema starting August 2016,   legs wrapped for blistering, with improvement of her symptoms,  prerenal azotemia, dehydration September 2015 Echo 12/2014: normal EF presents today for diastolic CHF, leg edema  last seen by mysef in clinic 06/2017 Seen by one of our providers 3/22    10/05/2016 LOC, seen in the ER had not eaten or drank anything in 2 days. 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 Raul Del,  on [place], rather than her CPAP Uses several inhalers such as albuterol, Symbicort  Reports having Neuropathy, bottom part of her feet bilaterally  Weight continues to be a problem, poor diet Does not smoke, surrounded by secondhand smoke.   EKG personally reviewed by myself on todays visit Shows normal sinus rhythm with rate 91 bpm no significant ST or T wave changes   other past medical history patient reportsapid weight gain starting in August September  2016, " up 45 pounds" Denies changes to her diet in that time Feels that her dry weight should be 220 pounds, down 60 pounds   Echocardiogram reviewed with  her showing normal LV function, evidence of diastolic dysfunction  normal right heart pressures in October 2016    PMH:   has a past medical history of Asthma, Bladder incontinence, Cancer ([place]) (1992), Chronic diastolic CHF (congestive heart failure) ([place]), Chronic insomnia, Chronic interstitial cystitis, GERD (gastroesophageal reflux disease), Glaucoma, Hypertension, Metabolic syndrome, Mood disorder ([place]), Nausea, Obesity, Ovarian cancer ([place]) (1994), Plantar fasciitis, and 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  PSH:    Past Surgical History:  Procedure Laterality Date   ABDOMINAL HYSTERECTOMY  1992   BLADDER SURGERY     multiple   BREAST BIOPSY Right 2016   sankar bx, benign   EYE SURGERY Bilateral 2006, 2008, 2010   FOOT SURGERY Bilateral 1996   KNEE SURGERY Left    age 64   TONSILLECTOMY AND ADENOIDECTOMY  1994   TUBAL LIGATION  1989    Current Outpatient Medications  Medication Sig Dispense Refill   acyclovir ointment (ZOVIRAX) 5 % Apply 1 application topically 5 (five) times daily as needed. 30 g 5   albuterol (PROVENTIL) (2.5 MG/3ML) 0.083% nebulizer solution Take 2.5 mg by nebulization every 6 (six) hours as needed for wheezing or shortness of breath.     amLODipine (NORVASC) 5 MG tablet TAKE 1 TABLET BY MOUTH NIGTHLY 60 tablet 1   budesonide-formoterol (SYMBICORT) 80-4.5 MCG/ACT inhaler Inhale 2 puffs into the lungs 2 (two) times daily.     carvedilol (COREG) 6.25 MG tablet TAKE 1 TABLET BY MOUTH TWICE A DAY 180 tablet 3   cloNIDine (  CATAPRES - DOSED IN MG/24 HR) 0.1 mg/24hr patch Place 1 patch (0.1 mg total) onto the skin once a week. (Patient taking differently: Place 0.1 mg onto the skin every Monday.) 4 patch 5   cloNIDine (CATAPRES - DOSED IN MG/24 HR) 0.1 mg/24hr patch PLACE 1 PATCH ONTO THE SKIN ONCE A WEEK 4 patch 5   diazepam (VALIUM) 5 MG tablet Take 1 tablet (5 mg total) by mouth every 12 (twelve) hours as needed for anxiety. 60 tablet 0   dorzolamide (TRUSOPT) 2 %  ophthalmic solution Place 1 drop into both eyes 3 (three) times daily.     EPINEPHRINE IJ Inject as directed as needed. Epi pen     escitalopram (LEXAPRO) 10 MG tablet Take 10 mg by mouth daily.     esomeprazole (NEXIUM) 40 MG capsule Take 1 capsule (40 mg total) by mouth daily before breakfast. 30 capsule 5   Fluticasone-Salmeterol (ADVAIR) 500-50 MCG/DOSE AEPB Inhale 1 puff into the lungs every 12 (twelve) hours. (Patient not taking: Reported on 09/29/2020)     ipratropium-albuterol (DUONEB) 0.5-2.5 (3) MG/3ML SOLN Take 3 mLs by nebulization 2 (two) times daily.     linaclotide (LINZESS) 145 MCG CAPS capsule Take 1 capsule (145 mcg total) by mouth daily. 30 capsule 5   loratadine (CLARITIN) 10 MG tablet TAKE ONE (1) TABLET EACH DAY 30 tablet 5   meloxicam (MOBIC) 7.5 MG tablet Take 1 tablet (7.5 mg total) by mouth daily. 30 tablet 5   mometasone (NASONEX) 50 MCG/ACT nasal spray Place 2 sprays into the nose daily. 30 g 5   montelukast (SINGULAIR) 10 MG tablet Take 1 tablet (10 mg total) by mouth at bedtime. 30 tablet 5   oxybutynin (DITROPAN XL) 15 MG 24 hr tablet TAKE 1 TABLET BY MOUTH DAILY 30 tablet 1   oxyCODONE (ROXICODONE) 15 MG immediate release tablet Limit 1 tab by mouth 3 - 4  times per day if tolerated (Patient taking differently: Limit 1 tab by mouth 4-6 times per day if tolerated) 120 tablet 0   pantoprazole (PROTONIX) 40 MG tablet Take 40 mg by mouth daily.     potassium chloride SA (KLOR-CON M) 20 MEQ tablet TAKE 1 TABLET BY MOUTH DAILY 90 tablet 3   promethazine (PHENERGAN) 25 MG tablet Take 1 tablet (25 mg total) by mouth every 6 (six) hours as needed for nausea. 30 tablet 2   topiramate (TOPAMAX) 25 MG tablet Take 25 mg by mouth 2 (two) times daily.     torsemide (DEMADEX) 20 MG tablet TAKE 3 TABLETS (60 MG TOTAL) BY MOUTH DAILY 270 tablet 3   valACYclovir (VALTREX) 500 MG tablet Take 1 tablet (500 mg total) by mouth 1 day or 1 dose. 30 tablet 12   valsartan-hydrochlorothiazide  (DIOVAN-HCT) 320-25 MG tablet TAKE 1 TABLET BY MOUTH DAILY 90 tablet 3   Current Facility-Administered Medications  Medication Dose Route Frequency Provider Last Rate Last Admin   orphenadrine (NORFLEX) injection 60 mg  60 mg Intramuscular Once Mohammed Kindle, MD         Allergies:   Red dye, Abilify [aripiprazole], Amitiza [lubiprostone], Benadryl [diphenhydramine hcl], Betadine [povidone-iodine], Doxycycline, Gabapentin, Iohexol, Lac bovis, Risperidone and related, Shellfish allergy, Strawberry (diagnostic), Strawberry extract, Aspirin, Ciprofloxacin, Enablex [darifenacin], Iodine, Latex, Levofloxacin, Penicillins, Risperidone, and Sulfa antibiotics   Social History:  The patient  reports that she has never smoked. She has never used smokeless tobacco. She reports that she does not drink alcohol and does not use drugs.  Family History:   family history includes Breast cancer (age of onset: 74) in her sister; Breast cancer (age of onset: 76) in her mother; COPD in her mother; Cancer in her father; Diabetes in her mother; Heart disease in her father and mother; Hypertension in her mother; Leukemia in her son; Prostate cancer in her brother; Stroke in her mother.    Review of Systems: Review of Systems  Constitutional: Negative.   Respiratory:  Positive for shortness of breath.   Cardiovascular:  Positive for leg swelling.  Gastrointestinal: Negative.   Musculoskeletal: Negative.   Neurological: Negative.   Psychiatric/Behavioral:  The patient is nervous/anxious.   All other systems reviewed and are negative.    PHYSICAL EXAM: VS:  There were no vitals taken for this visit. , BMI There is no height or weight on file to calculate BMI. Constitutional:  oriented to person, place, and time. No distress. Obese, tearful HENT:  Head: Normocephalic and atraumatic.  Eyes:  no discharge. No scleral icterus.  Neck: Normal range of motion. Neck supple. No JVD present.  Cardiovascular: Normal  rate, regular rhythm, normal heart sounds and intact distal pulses. Exam reveals no gallop and no friction rub. No edema No murmur heard. Pulmonary/Chest: Effort normal and breath sounds normal. No stridor. No respiratory distress.  no wheezes.  no rales.  no tenderness.  Abdominal: Soft.  no distension.  no tenderness.  Musculoskeletal: Normal range of motion.  no  tenderness or deformity.  Neurological:  normal muscle tone. Coordination normal. No atrophy Skin: Skin is warm and dry. No rash noted. not diaphoretic.  Psychiatric:  normal mood and affect. behavior is normal. Thought content normal.   Recent Labs: 09/29/2020: ALT 25; B Natriuretic Peptide 20.8 10/02/2020: BUN 9; Creatinine, Ser 0.72; Magnesium 2.3; Potassium 3.5; Sodium 133 10/03/2020: Hemoglobin 13.7; Platelets 246    Lipid Panel Lab Results  Component Value Date   CHOL 181 07/03/2014   HDL 52 07/03/2014   LDLCALC 99 07/03/2014   TRIG 152 07/03/2014      Wt Readings from Last 3 Encounters:  06/29/17 267 lb 4 oz (121.2 kg)  10/05/16 240 lb (108.9 kg) from the emergency room, not accurate()  04/22/16 263 lb 12 oz (119.6 kg)      ASSESSMENT AND PLAN:  Diastolic heart failure, unspecified heart failure chronicity ([place]) - Plan: EKG 12-Lead Previously with high fluid intake Recommend she continue on her torsemide twice daily Weight up 4 pounds from 1 year ago  Continue with her lymphedema compression pumps  Adjustment disorder Tearful in the exam room today, reports having significant stressors at home She is requesting a letter so that she does not have to take care of additional family responsibilities, 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  Hypertension, benign Blood pressure is well controlled on today's visit. No  changes made to the medications. Stable  OSA treated with BiPAP Recommended compliance with her Bipap,  High weight continues to be a problem adding to symptoms  Lower extremity edema Exacerbated by weight Also with component of lymphedema Symptoms improved with lymphedema compression pumps 3 times a day Encouraged her to continue to wear her compression hose   Total encounter time more than 45 minutes  Greater than 50% was spent in counseling and coordination of care with the patient   Disposition:  F/U  12 months as needed   No orders of the defined types were placed in this encounter.    Signed, Esmond Plants, M.D., Ph.D. 09/20/2021  [place], [place]

## 2021-09-22 ENCOUNTER — Ambulatory Visit: Payer: Medicaid Other | Admitting: Cardiovascular Disease

## 2021-09-22 DIAGNOSIS — I10 Essential (primary) hypertension: Secondary | ICD-10-CM

## 2021-09-22 DIAGNOSIS — I5032 Chronic diastolic (congestive) heart failure: Secondary | ICD-10-CM

## 2021-09-22 DIAGNOSIS — G4733 Obstructive sleep apnea (adult) (pediatric): Secondary | ICD-10-CM

## 2021-09-22 DIAGNOSIS — M7989 Other specified soft tissue disorders: Secondary | ICD-10-CM

## 2021-12-15 NOTE — Progress Notes (Deleted)
NO SHOW

## 2021-12-16 ENCOUNTER — Ambulatory Visit: Payer: Medicaid Other | Attending: Cardiovascular Disease | Admitting: Cardiovascular Disease

## 2021-12-16 DIAGNOSIS — I10 Essential (primary) hypertension: Secondary | ICD-10-CM

## 2021-12-16 DIAGNOSIS — G4733 Obstructive sleep apnea (adult) (pediatric): Secondary | ICD-10-CM

## 2021-12-16 DIAGNOSIS — I5032 Chronic diastolic (congestive) heart failure: Secondary | ICD-10-CM

## 2021-12-16 DIAGNOSIS — M7989 Other specified soft tissue disorders: Secondary | ICD-10-CM

## 2021-12-17 ENCOUNTER — Encounter: Payer: Self-pay | Admitting: Cardiovascular Disease

## 2021-12-23 ENCOUNTER — Other Ambulatory Visit: Payer: Self-pay | Admitting: Internal Medicine

## 2021-12-23 DIAGNOSIS — Z1231 Encounter for screening mammogram for malignant neoplasm of breast: Secondary | ICD-10-CM

## 2022-01-13 ENCOUNTER — Other Ambulatory Visit: Payer: Self-pay | Admitting: Cardiovascular Disease

## 2022-01-14 ENCOUNTER — Other Ambulatory Visit: Payer: Self-pay | Admitting: Cardiovascular Disease

## 2022-06-02 ENCOUNTER — Other Ambulatory Visit: Payer: Self-pay | Admitting: Cardiovascular Disease

## 2022-06-02 DIAGNOSIS — M7989 Other specified soft tissue disorders: Secondary | ICD-10-CM

## 2022-06-03 ENCOUNTER — Other Ambulatory Visit: Payer: Self-pay | Admitting: Cardiovascular Disease

## 2022-06-03 DIAGNOSIS — M7989 Other specified soft tissue disorders: Secondary | ICD-10-CM

## 2023-03-20 IMAGING — CR DG CHEST 2V
1 series · 2 of 2 positions shown · non-contrast
Comparison: Chest radiograph 10/05/2016 and earlier.

CLINICAL DATA: 60-year-old female with shortness of breath, sore
throat, fever cough and congestion.

EXAM:
CHEST - 2 VIEW

[Series 1: dg chest 2 view · 0.14mm/px · 2 of 2 slices shown]
[im 1/2]
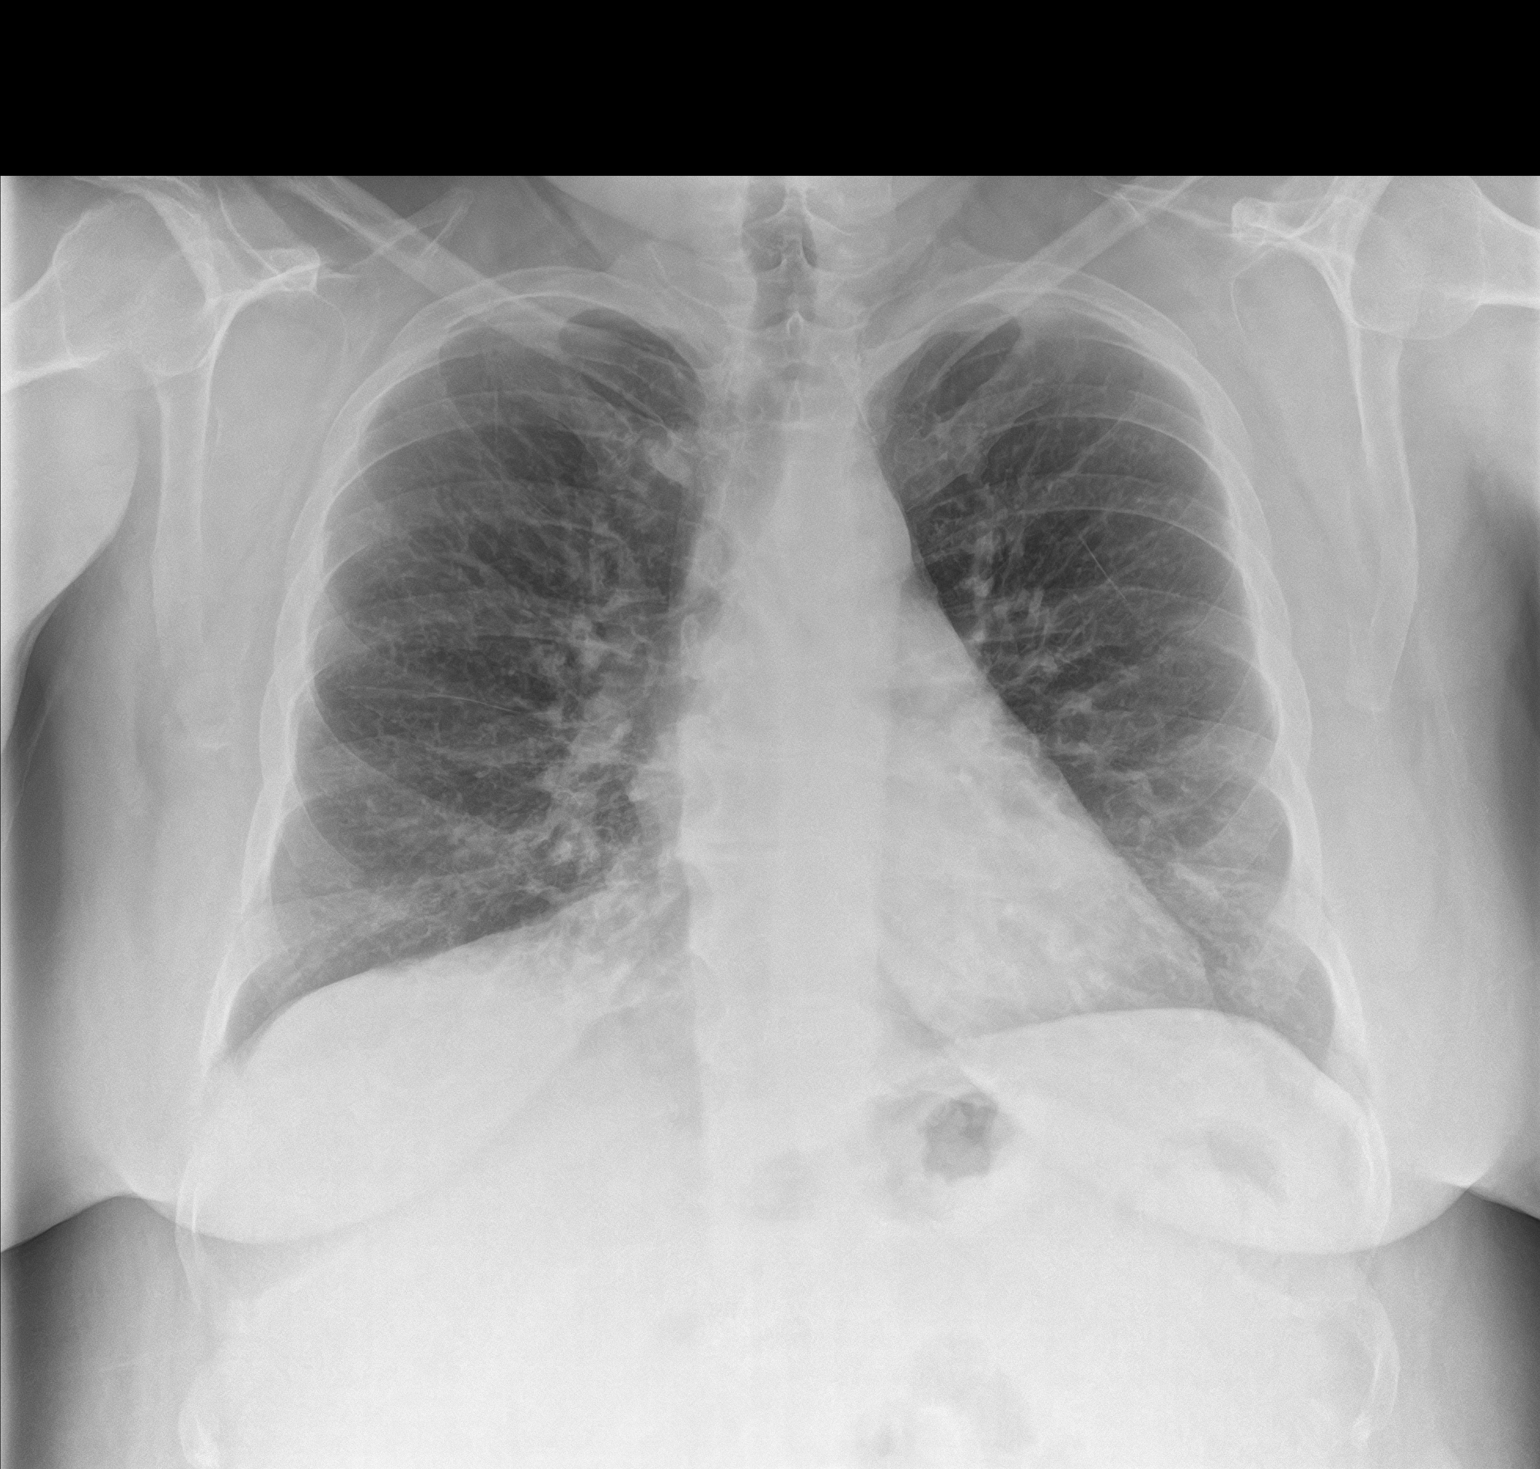
[im 2/2]
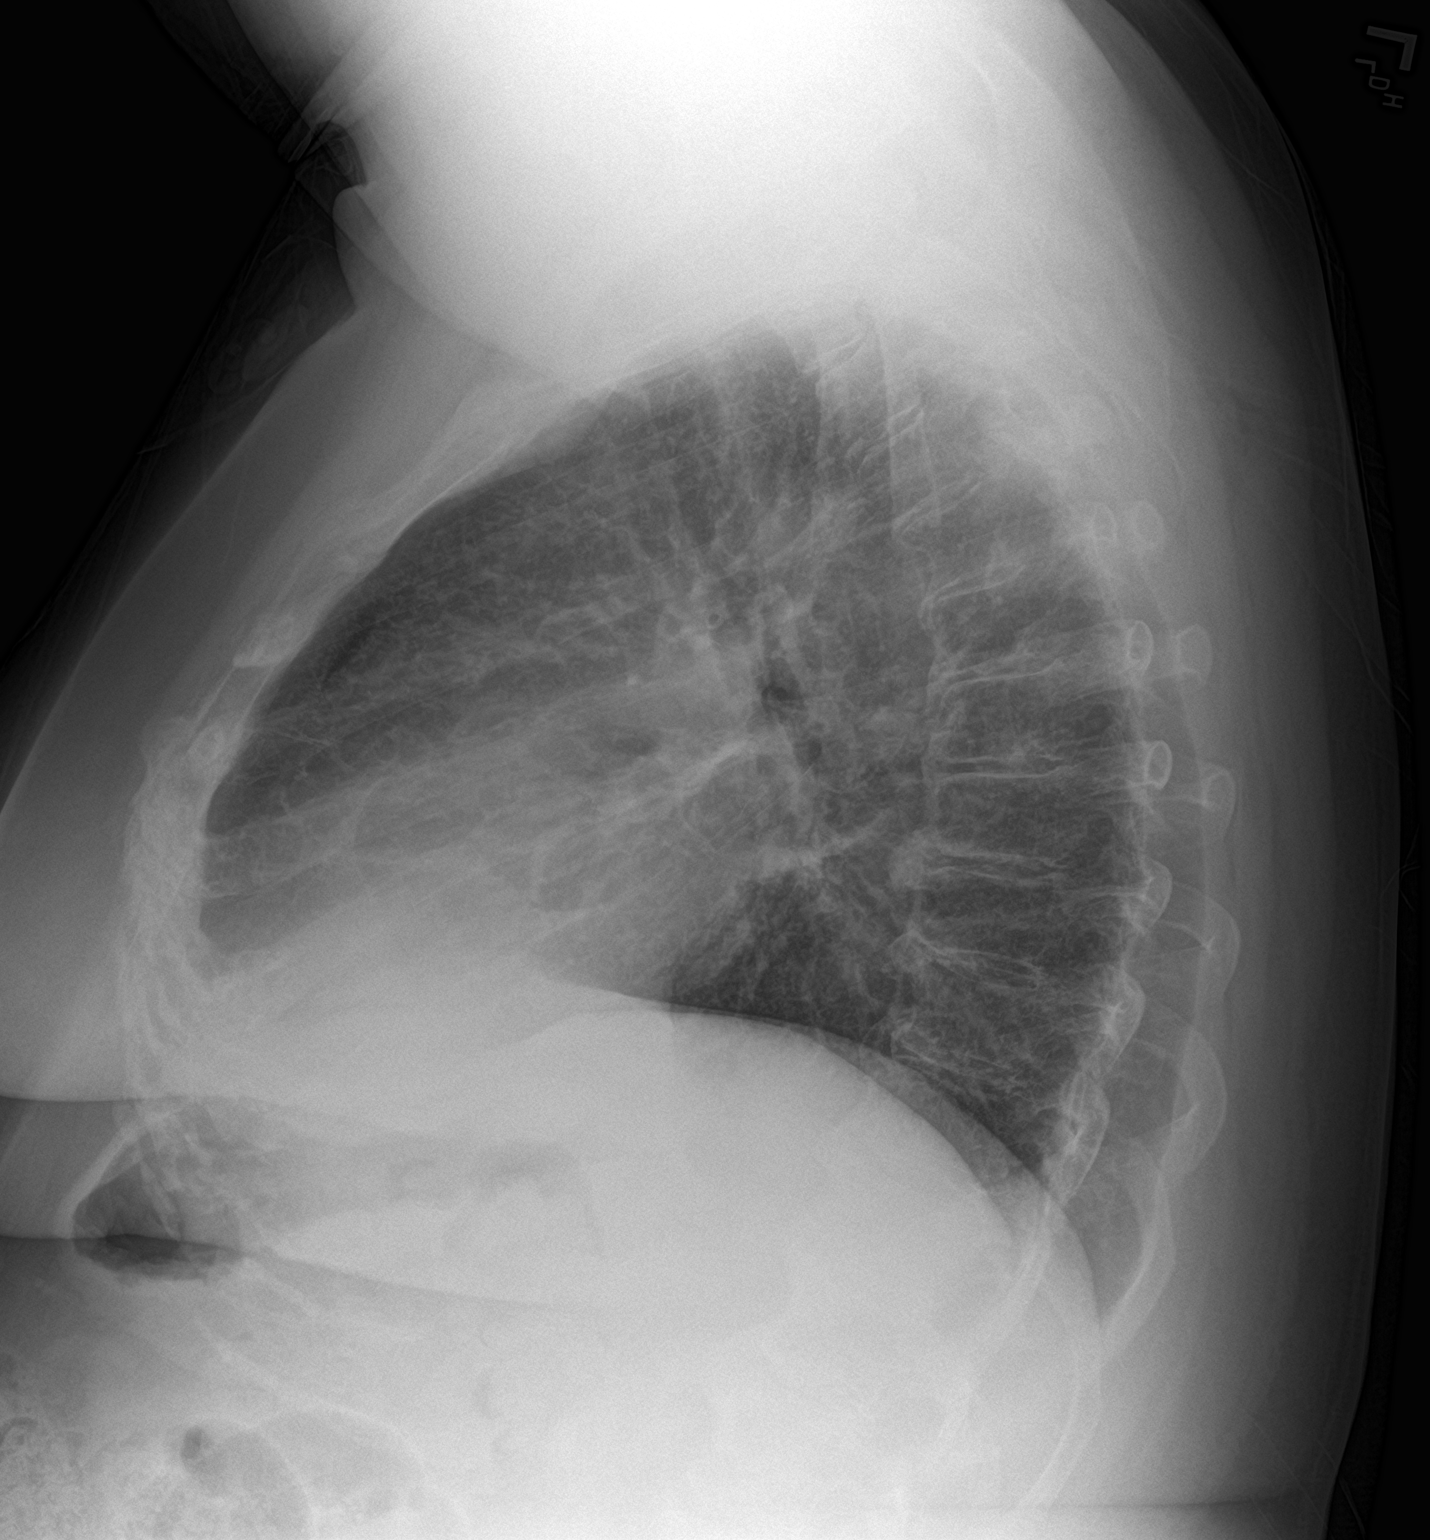

[2 of 2 positions shown; findings below may reference images not displayed]

FINDINGS: Lung volumes and mediastinal contours remain normal. Visualized
tracheal air column is within normal limits. No pneumothorax,
pulmonary edema, pleural effusion or confluent pulmonary opacity.
Mild chronic increased interstitial markings in both lungs appears
stable or decreased since 0463.

Degenerative endplate spurring in the spine. No acute osseous
abnormality identified. Negative visible bowel gas pattern.
IMPRESSION: No acute cardiopulmonary abnormality.

## 2023-10-22 ENCOUNTER — Other Ambulatory Visit: Payer: Self-pay | Admitting: Internal Medicine

## 2023-10-22 DIAGNOSIS — Z1231 Encounter for screening mammogram for malignant neoplasm of breast: Secondary | ICD-10-CM

## 2023-11-23 ENCOUNTER — Other Ambulatory Visit: Payer: Self-pay | Admitting: Internal Medicine

## 2023-11-23 DIAGNOSIS — Z1231 Encounter for screening mammogram for malignant neoplasm of breast: Secondary | ICD-10-CM

## 2023-11-23 DIAGNOSIS — N63 Unspecified lump in unspecified breast: Secondary | ICD-10-CM

## 2023-12-06 ENCOUNTER — Ambulatory Visit (INDEPENDENT_AMBULATORY_CARE_PROVIDER_SITE_OTHER)

## 2023-12-06 DIAGNOSIS — A630 Anogenital (venereal) warts: Secondary | ICD-10-CM

## 2023-12-06 DIAGNOSIS — D485 Neoplasm of uncertain behavior of skin: Secondary | ICD-10-CM

## 2023-12-06 NOTE — Progress Notes (Signed)
    Subjective   Jacqueline Campbell is a 63 y.o. female who presents for the following: Lesion(s) of concern . Patient is new patient  Today patient reports: Growth on the buttocks, present x 8 months or longer. Gets rubbed by undergarments.   Review of Systems:    No other skin or systemic complaints except as noted in HPI or Assessment and Plan.  The following portions of the chart were reviewed this encounter and updated as appropriate: medications, allergies, medical history  Relevant Medical History:  n/a   Objective  Well appearing patient in no apparent distress; mood and affect are within normal limits. Examination was performed of the:   Examination notable for:- Pink to brown velvety, soft papules, some pedunculated, in the genital area.  Examination limited by: Clothing   Right perianal 1.5 cm verrucous plaque R buttock   Assessment & Plan   Condyloma accuminata - Counseling provided regarding viral etiology, transmissibility, and expected treatment course including need for multiple treatments.  Reviewed small but present risk of SCC developing within lesions. - Reviewed treatment options including cryotherapy vs. Imiquimod ([place]) vs shave removal - Given size, opted to tx with shave removal - procedure note below   - Recommended patient to receive the HPV vaccine series through his Primary care provider. - Discussed this is a chronic condition and may require multiple treatments    Procedures, orders, diagnosis for this visit:  NEOPLASM OF UNCERTAIN BEHAVIOR OF SKIN Right perianal Epidermal / dermal shaving  Lesion diameter (cm):  1.5 Informed consent: discussed and consent obtained   Patient was prepped and draped in usual sterile fashion: Area prepped with alcohol. Anesthesia: the lesion was anesthetized in a standard fashion   Anesthetic:  1% lidocaine  w/ epinephrine 1-100,000 buffered w/ 8.4% NaHCO3 Instrument used: flexible razor blade   Hemostasis achieved  with: pressure, aluminum chloride and electrodesiccation   Outcome: patient tolerated procedure well   Post-procedure details: wound care instructions given   Post-procedure details comment:  Ointment and small bandage applied  Specimen 1 - Surgical pathology Differential Diagnosis: Condyloma vs other Check Margins: No  Neoplasm of uncertain behavior of skin -     Epidermal / dermal shaving -     Surgical pathology; Standing    Return to clinic: Return if symptoms worsen or fail to improve.  Documentation: I have reviewed the above documentation for accuracy and completeness, and I agree with the above.  Lauraine JAYSON Kanaris, MD

## 2023-12-06 NOTE — Patient Instructions (Addendum)

## 2023-12-08 ENCOUNTER — Ambulatory Visit: Payer: Self-pay

## 2023-12-08 LAB — SURGICAL PATHOLOGY

## 2023-12-08 NOTE — Telephone Encounter (Signed)
 Left voicemail to return my call

## 2023-12-08 NOTE — Telephone Encounter (Signed)
-----   Message from Lauraine JAYSON Kanaris sent at 12/08/2023  4:03 PM EDT -----    1. Skin, right perianal :       CONDYLOMA ACUMINATUM   Please notify patient with below plan: Biopsy as per above. Treated with shave removal in clinic. Please have pt call office if developing any new lesions.  ----- Message ----- From: Interface, Lab In [facility] Sent: 12/08/2023   4:02 PM EDT To: Lauraine JAYSON Kanaris, MD

## 2023-12-09 NOTE — Telephone Encounter (Signed)
 Discussed biopsy results with patient

## 2023-12-09 NOTE — Telephone Encounter (Signed)
-----   Message from Lauraine JAYSON Kanaris sent at 12/08/2023  4:03 PM EDT -----    1. Skin, right perianal :       CONDYLOMA ACUMINATUM   Please notify patient with below plan: Biopsy as per above. Treated with shave removal in clinic. Please have pt call office if developing any new lesions.  ----- Message ----- From: Interface, Lab In [facility] Sent: 12/08/2023   4:02 PM EDT To: Lauraine JAYSON Kanaris, MD

## 2023-12-14 ENCOUNTER — Ambulatory Visit
Admission: RE | Admit: 2023-12-14 | Discharge: 2023-12-14 | Disposition: A | Discharge status: Home / Self Care | Source: Ambulatory Visit | Attending: Internal Medicine | Admitting: Internal Medicine

## 2023-12-14 DIAGNOSIS — N63 Unspecified lump in unspecified breast: Secondary | ICD-10-CM

## 2023-12-14 DIAGNOSIS — Z1231 Encounter for screening mammogram for malignant neoplasm of breast: Secondary | ICD-10-CM

## 2023-12-14 DIAGNOSIS — N6312 Unspecified lump in the right breast, upper inner quadrant: Secondary | ICD-10-CM | POA: Diagnosis not present

## 2023-12-14 DIAGNOSIS — N644 Mastodynia: Secondary | ICD-10-CM | POA: Insufficient documentation

## 2023-12-22 LAB — COLOGUARD: COLOGUARD: POSITIVE — AB

## 2024-04-26 ENCOUNTER — Ambulatory Visit: Admission: RE | Admit: 2024-04-26 | Source: Home / Self Care | Admitting: Internal Medicine

## 2024-04-26 ENCOUNTER — Encounter: Admission: RE | Payer: Self-pay | Source: Home / Self Care

## 2024-09-13 ENCOUNTER — Ambulatory Visit: Admit: 2024-09-13 | Admitting: Internal Medicine
